# Patient Record
Sex: Male | Born: 1935 | Race: White | Hispanic: No | Smoking: Never smoker
Health system: Southern US, Community
[De-identification: ages and names within clinical notes are randomized; demographics above are authoritative.]

## PROBLEM LIST (undated history)

## (undated) DIAGNOSIS — M1990 Unspecified osteoarthritis, unspecified site: Secondary | ICD-10-CM

## (undated) DIAGNOSIS — E785 Hyperlipidemia, unspecified: Secondary | ICD-10-CM

## (undated) DIAGNOSIS — H95193 Other disorders following mastoidectomy, bilateral ears: Secondary | ICD-10-CM

## (undated) DIAGNOSIS — I10 Essential (primary) hypertension: Secondary | ICD-10-CM

## (undated) DIAGNOSIS — R1310 Dysphagia, unspecified: Secondary | ICD-10-CM

## (undated) DIAGNOSIS — D649 Anemia, unspecified: Secondary | ICD-10-CM

## (undated) DIAGNOSIS — M109 Gout, unspecified: Secondary | ICD-10-CM

## (undated) DIAGNOSIS — S62109A Fracture of unspecified carpal bone, unspecified wrist, initial encounter for closed fracture: Secondary | ICD-10-CM

## (undated) HISTORY — PX: OTHER SURGICAL HISTORY: SHX169

## (undated) HISTORY — DX: Anemia, unspecified: D64.9

## (undated) HISTORY — PX: MASTOIDECTOMY REVISION: SUR856

## (undated) HISTORY — DX: Essential (primary) hypertension: I10

## (undated) HISTORY — DX: Hyperlipidemia, unspecified: E78.5

## (undated) HISTORY — DX: Unspecified osteoarthritis, unspecified site: M19.90

---

## 1998-08-07 ENCOUNTER — Other Ambulatory Visit: Admission: RE | Admit: 1998-08-07 | Discharge: 1998-08-07 | Payer: Self-pay | Admitting: Urology

## 2000-09-30 ENCOUNTER — Other Ambulatory Visit: Admission: RE | Admit: 2000-09-30 | Discharge: 2000-09-30 | Payer: Self-pay | Admitting: Urology

## 2000-09-30 ENCOUNTER — Encounter (INDEPENDENT_AMBULATORY_CARE_PROVIDER_SITE_OTHER): Payer: Self-pay | Admitting: Specialist

## 2004-01-07 ENCOUNTER — Ambulatory Visit: Payer: Self-pay | Admitting: Internal Medicine

## 2005-10-05 ENCOUNTER — Encounter: Admission: RE | Admit: 2005-10-05 | Discharge: 2005-10-05 | Payer: Self-pay | Admitting: Orthopedic Surgery

## 2007-03-02 DIAGNOSIS — C439 Malignant melanoma of skin, unspecified: Secondary | ICD-10-CM

## 2007-03-02 HISTORY — DX: Malignant melanoma of skin, unspecified: C43.9

## 2007-03-06 ENCOUNTER — Ambulatory Visit: Payer: Self-pay | Admitting: Internal Medicine

## 2007-03-06 LAB — CONVERTED CEMR LAB
Folate: >20 ng/mL
Vitamin B-12: 951 pg/mL — ABNORMAL HIGH (ref 211–911)

## 2007-07-25 ENCOUNTER — Ambulatory Visit: Payer: Self-pay | Admitting: Hematology and Oncology

## 2007-07-26 ENCOUNTER — Encounter: Payer: Self-pay | Admitting: Internal Medicine

## 2007-08-01 ENCOUNTER — Ambulatory Visit (HOSPITAL_COMMUNITY): Admission: RE | Admit: 2007-08-01 | Discharge: 2007-08-01 | Payer: Self-pay | Admitting: Hematology and Oncology

## 2007-08-02 LAB — UIFE/LIGHT CHAINS/TP QN, 24-HR UR
Free Kappa Lt Chains,Ur: 9.08 mg/dL — ABNORMAL HIGH (ref 0.04–1.51)
Free Kappa/Lambda Ratio: 20.18 ratio — ABNORMAL HIGH (ref 0.46–4.00)
Free Lambda Lt Chains,Ur: 0.45 mg/dL (ref 0.08–1.01)
Free Lt Chn Excr Rate: 72.64 mg/d
Time: 24 hours
Total Protein, Urine-Ur/day: 94 mg/d (ref 10–140)
Total Protein, Urine: 11.7 mg/dL
Volume, Urine: 800 mL

## 2007-08-09 ENCOUNTER — Encounter: Payer: Self-pay | Admitting: Internal Medicine

## 2008-02-01 ENCOUNTER — Ambulatory Visit: Payer: Self-pay | Admitting: Hematology and Oncology

## 2008-02-05 LAB — CBC WITH DIFFERENTIAL/PLATELET
Basophils Absolute: 0 10*3/uL (ref 0.0–0.1)
EOS%: 4.9 % (ref 0.0–7.0)
HGB: 12.5 g/dL — ABNORMAL LOW (ref 13.0–17.1)
LYMPH%: 21.3 % (ref 14.0–48.0)
MCH: 32.4 pg (ref 28.0–33.4)
MCV: 93.7 fL (ref 81.6–98.0)
MONO%: 7.2 % (ref 0.0–13.0)
Platelets: 214 10*3/uL (ref 145–400)
RBC: 3.85 10*6/uL — ABNORMAL LOW (ref 4.20–5.71)
RDW: 13.7 % (ref 11.2–14.6)

## 2008-02-07 ENCOUNTER — Encounter: Payer: Self-pay | Admitting: Internal Medicine

## 2008-02-07 LAB — COMPREHENSIVE METABOLIC PANEL
AST: 23 U/L (ref 0–37)
Albumin: 4.2 g/dL (ref 3.5–5.2)
Alkaline Phosphatase: 52 U/L (ref 39–117)
BUN: 20 mg/dL (ref 6–23)
Creatinine, Ser: 1.44 mg/dL (ref 0.40–1.50)
Potassium: 3.8 mEq/L (ref 3.5–5.3)
Total Bilirubin: 0.6 mg/dL (ref 0.3–1.2)

## 2008-02-07 LAB — SPEP & IFE WITH QIG
Albumin ELP: 58.9 % (ref 55.8–66.1)
Alpha-1-Globulin: 4.9 % (ref 2.9–4.9)
IgA: 153 mg/dL (ref 68–378)
IgM, Serum: 70 mg/dL (ref 60–263)
Total Protein, Serum Electrophoresis: 6.7 g/dL (ref 6.0–8.3)

## 2008-08-01 ENCOUNTER — Ambulatory Visit: Payer: Self-pay | Admitting: Hematology and Oncology

## 2008-08-05 ENCOUNTER — Ambulatory Visit (HOSPITAL_COMMUNITY): Admission: RE | Admit: 2008-08-05 | Discharge: 2008-08-05 | Payer: Self-pay | Admitting: Hematology and Oncology

## 2008-08-05 LAB — CBC WITH DIFFERENTIAL/PLATELET
Eosinophils Absolute: 0 10*3/uL (ref 0.0–0.5)
MONO#: 0.3 10*3/uL (ref 0.1–0.9)
NEUT#: 4.3 10*3/uL (ref 1.5–6.5)
Platelets: 203 10*3/uL (ref 140–400)
RBC: 3.57 10*6/uL — ABNORMAL LOW (ref 4.20–5.82)
RDW: 13.5 % (ref 11.0–14.6)
WBC: 5.4 10*3/uL (ref 4.0–10.3)
lymph#: 0.7 10*3/uL — ABNORMAL LOW (ref 0.9–3.3)

## 2008-08-07 ENCOUNTER — Encounter: Payer: Self-pay | Admitting: Internal Medicine

## 2008-08-07 LAB — COMPREHENSIVE METABOLIC PANEL
Albumin: 4.1 g/dL (ref 3.5–5.2)
CO2: 29 mEq/L (ref 19–32)
Chloride: 104 mEq/L (ref 96–112)
Glucose, Bld: 123 mg/dL — ABNORMAL HIGH (ref 70–99)
Potassium: 3.8 mEq/L (ref 3.5–5.3)
Sodium: 141 mEq/L (ref 135–145)
Total Protein: 6.8 g/dL (ref 6.0–8.3)

## 2008-08-07 LAB — SPEP & IFE WITH QIG
Beta 2: 3.4 % (ref 3.2–6.5)
Beta Globulin: 6 % (ref 4.7–7.2)
IgA: 128 mg/dL (ref 68–378)
IgG (Immunoglobin G), Serum: 1190 mg/dL (ref 694–1618)
M-Spike, %: 0.64 g/dL

## 2008-08-21 ENCOUNTER — Ambulatory Visit (HOSPITAL_COMMUNITY): Admission: RE | Admit: 2008-08-21 | Discharge: 2008-08-21 | Payer: Self-pay | Admitting: Internal Medicine

## 2008-08-27 ENCOUNTER — Encounter: Admission: RE | Admit: 2008-08-27 | Discharge: 2008-08-27 | Payer: Self-pay | Admitting: Internal Medicine

## 2008-09-13 ENCOUNTER — Encounter: Admission: RE | Admit: 2008-09-13 | Discharge: 2008-09-13 | Payer: Self-pay | Admitting: Internal Medicine

## 2008-10-30 ENCOUNTER — Encounter: Admission: RE | Admit: 2008-10-30 | Discharge: 2008-11-28 | Payer: Self-pay | Admitting: Internal Medicine

## 2009-05-01 ENCOUNTER — Ambulatory Visit: Payer: Self-pay | Admitting: Hematology and Oncology

## 2009-05-05 LAB — CBC WITH DIFFERENTIAL/PLATELET
BASO%: 0.3 % (ref 0.0–2.0)
Basophils Absolute: 0 10*3/uL (ref 0.0–0.1)
EOS%: 1.9 % (ref 0.0–7.0)
HCT: 32.6 % — ABNORMAL LOW (ref 38.4–49.9)
HGB: 11.4 g/dL — ABNORMAL LOW (ref 13.0–17.1)
MCH: 33.6 pg — ABNORMAL HIGH (ref 27.2–33.4)
MONO#: 0.3 10*3/uL (ref 0.1–0.9)
RDW: 13.2 % (ref 11.0–14.6)
WBC: 4.1 10*3/uL (ref 4.0–10.3)
lymph#: 0.9 10*3/uL (ref 0.9–3.3)

## 2009-05-07 LAB — COMPREHENSIVE METABOLIC PANEL
ALT: 17 U/L (ref 0–53)
AST: 24 U/L (ref 0–37)
Albumin: 4 g/dL (ref 3.5–5.2)
BUN: 21 mg/dL (ref 6–23)
CO2: 28 mEq/L (ref 19–32)
Calcium: 9.5 mg/dL (ref 8.4–10.5)
Chloride: 102 mEq/L (ref 96–112)
Potassium: 4 mEq/L (ref 3.5–5.3)

## 2009-05-07 LAB — SPEP & IFE WITH QIG
Alpha-1-Globulin: 4.3 % (ref 2.9–4.9)
Alpha-2-Globulin: 7.9 % (ref 7.1–11.8)
Beta 2: 2.9 % — ABNORMAL LOW (ref 3.2–6.5)
Gamma Globulin: 17.7 % (ref 11.1–18.8)
IgG (Immunoglobin G), Serum: 1190 mg/dL (ref 694–1618)

## 2009-05-08 ENCOUNTER — Encounter: Payer: Self-pay | Admitting: Internal Medicine

## 2010-01-15 ENCOUNTER — Ambulatory Visit: Payer: Self-pay | Admitting: Internal Medicine

## 2010-01-15 DIAGNOSIS — K5900 Constipation, unspecified: Secondary | ICD-10-CM | POA: Insufficient documentation

## 2010-01-15 DIAGNOSIS — Z860100 Personal history of colon polyps, unspecified: Secondary | ICD-10-CM | POA: Insufficient documentation

## 2010-01-15 DIAGNOSIS — D649 Anemia, unspecified: Secondary | ICD-10-CM | POA: Insufficient documentation

## 2010-02-03 ENCOUNTER — Ambulatory Visit: Payer: Self-pay | Admitting: Hematology and Oncology

## 2010-02-04 ENCOUNTER — Ambulatory Visit (HOSPITAL_COMMUNITY)
Admission: RE | Admit: 2010-02-04 | Discharge: 2010-02-04 | Payer: Self-pay | Source: Home / Self Care | Admitting: Hematology and Oncology

## 2010-02-04 LAB — CBC WITH DIFFERENTIAL/PLATELET
BASO%: 0.4 % (ref 0.0–2.0)
EOS%: 2.2 % (ref 0.0–7.0)
HCT: 33.4 % — ABNORMAL LOW (ref 38.4–49.9)
LYMPH%: 22.8 % (ref 14.0–49.0)
MCH: 34.1 pg — ABNORMAL HIGH (ref 27.2–33.4)
MCHC: 35 g/dL (ref 32.0–36.0)
MCV: 97.5 fL (ref 79.3–98.0)
MONO%: 8.1 % (ref 0.0–14.0)
NEUT%: 66.5 % (ref 39.0–75.0)
Platelets: 206 10*3/uL (ref 140–400)

## 2010-02-06 LAB — PROTEIN ELECTROPHORESIS, SERUM
Alpha-1-Globulin: 4.8 % (ref 2.9–4.9)
Alpha-2-Globulin: 8.9 % (ref 7.1–11.8)
Gamma Globulin: 17.1 % (ref 11.1–18.8)

## 2010-02-06 LAB — LACTATE DEHYDROGENASE: LDH: 122 U/L (ref 94–250)

## 2010-02-06 LAB — IGG, IGA, IGM
IgA: 104 mg/dL (ref 68–378)
IgG (Immunoglobin G), Serum: 1150 mg/dL (ref 694–1618)

## 2010-02-06 LAB — COMPREHENSIVE METABOLIC PANEL
ALT: 16 U/L (ref 0–53)
CO2: 31 mEq/L (ref 19–32)
Creatinine, Ser: 1.37 mg/dL (ref 0.40–1.50)
Total Bilirubin: 0.4 mg/dL (ref 0.3–1.2)

## 2010-02-11 ENCOUNTER — Encounter: Payer: Self-pay | Admitting: Internal Medicine

## 2010-04-02 NOTE — Letter (Signed)
Summary: [facility]  [facility]   Imported By: Sherian Rein 02/24/2010 15:53:28  _____________________________________________________________________  External Attachment:    Type:   Image     Comment:   External Document

## 2010-04-02 NOTE — Letter (Signed)
[facility]   Imported By: Lester Burleson 05/27/2009 07:39:24  _____________________________________________________________________  External Attachment:    Type:   Image     Comment:   External Document

## 2010-04-02 NOTE — Assessment & Plan Note (Signed)
Summary: Anemia and constipation   History of Present Illness Visit Type: Initial Consult Primary GI MD: Yancey Flemings MD Primary Provider: Ezequiel Kayser, MD Requesting Provider: Ezequiel Kayser, MD Chief Complaint: Anemia. Pt states that he was having trouble with constipation but started Miralax and he is ok and denies any GI complaints  History of Present Illness:   77 year old white male with a history of hypertension, asthma, gout, and monoclonal gammopathy for which he is followed by hematology. He presents today at the recommendation of Dr. Waynard Edwards. He saw Dr. Waynard Edwards in December 2010. At that time he was noted to be mildly anemic with a hemoglobin of 11.6. Macrocytic with MCV of 99. Normal B12, folate, and iron studies. At that time he also complained of constipation. MiraLax recommended. MiraLax controls his bowels quite well. He has postponed his appointment here for unclear reasons, the esophagus this time to followup as previously recommended. Nothing to precipitate this visit such as recurrent constipation, bleeding, or abdominal pain. He does states that he "felt guilty". Hemoglobin from the hematology office in March 2011 was 11.4. Complete colonoscopy performed in November 2005 revealed mild sigmoid diverticulosis and a diminutive colon polyp which was removed. Patient has no active GI complaints at this time.   GI Review of Systems      Denies abdominal pain, acid reflux, belching, bloating, chest pain, dysphagia with liquids, dysphagia with solids, heartburn, loss of appetite, nausea, vomiting, vomiting blood, weight loss, and  weight gain.        Denies anal fissure, black tarry stools, change in bowel habit, constipation, diarrhea, diverticulosis, fecal incontinence, heme positive stool, hemorrhoids, irritable bowel syndrome, jaundice, light color stool, liver problems, rectal bleeding, and  rectal pain.    Current Medications (verified): 1)  The Very Finest Fish Oil  Liqd (Omega-3  Fatty Acids) .... 2 Teaspoons Once Daily 2)  Vitamin B-12 1000 Mcg Tabs (Cyanocobalamin) .... One Tablet By Mouth Once Daily 3)  Vitamin D 2000 Unit Tabs (Cholecalciferol) .... One Tablet By Mouth Once Daily 4)  Calcium 1000 + D 1000-800 Mg-Unit Tabs (Calcium Carb-Cholecalciferol) .... One Tablet By Mouth Once Daily 5)  Vision Vitamins  Tabs (Multiple Vitamins-Minerals) .... One Tablet By Mouth Once Daily 6)  Benefiber  Powd (Wheat Dextrin) .... Two Teaspoons Once Daily 7)  Miralax  Powd (Polyethylene Glycol 3350) .... Once Daily 8)  Crestor 5 Mg Tabs (Rosuvastatin Calcium) .... One Tablet By Mouth Once Daily 9)  Finasteride 5 Mg Tabs (Finasteride) .... One Tablet By Mouth Once Daily 10)  Chlorthalidone 25 Mg Tabs (Chlorthalidone) .... 1/2  Tablet By Mouth Once Daily  Allergies (verified): 1)  ! Aspirin  Past History:  Past Medical History: 211.3: Colon Polyps 562.10: Diverticulosis, Colon Asthma Hypertension Gout Dyslipidemia  BPH Spinal Stenosis  Past Surgical History: Tonsillectomy  Family History: No FH of Colon Cancer:  Social History: Retired Widowed  Patient has never smoked.  Alcohol Use - no Illicit Drug Use - no Daily Caffeine Use: daily 2 cups of coffee  Smoking Status:  never Drug Use:  no  Review of Systems       The patient complains of anemia and hearing problems.  The patient denies allergy/sinus, anxiety-new, arthritis/joint pain, back pain, blood in urine, breast changes/lumps, change in vision, confusion, cough, coughing up blood, depression-new, fainting, fatigue, fever, headaches-new, heart murmur, heart rhythm changes, itching, menstrual pain, muscle pains/cramps, night sweats, nosebleeds, pregnancy symptoms, shortness of breath, skin rash, sleeping problems, sore throat, swelling  of feet/legs, swollen lymph glands, thirst - excessive , urination - excessive , urination changes/pain, urine leakage, vision changes, and voice change.    Vital  Signs:  Patient profile:   77 year old male Height:      67 inches Weight:      141 pounds BMI:     22.16 BSA:     1.74 Pulse rate:   64 / minute Pulse rhythm:   regular BP sitting:   128 / 64  (left arm) Cuff size:   regular  Vitals Entered By: Ok Anis CMA (January 15, 2010 10:42 AM)  Physical Exam  General:  Well developed, well nourished, no acute distress. Head:  Normocephalic and atraumatic. Eyes:  anicteric Ears:  bilateral hearing aids Mouth:  No deformity or lesions. Lungs:  Clear throughout to auscultation. Heart:  Regular rate and rhythm; no murmurs, rubs,  or bruits. Abdomen:  Soft, nontender and nondistended. No masses, hepatosplenomegaly or hernias noted. Normal bowel sounds. Msk:  Symmetrical with no gross deformities. Normal posture. Pulses:  Normal pulses noted. Extremities:  no edema Neurologic:  Alert and  oriented x4. Skin:  no jaundice with normal skin pallor Psych:  Alert and cooperative. Normal mood and affect.   Impression & Recommendations:  Problem # 1:  ANEMIA-UNSPECIFIED (ICD-285.9) stable mild macrocytic anemia with no evidence of bleeding or iron deficiency. Colonoscopy in 2005 as described. Suspect mild anemia related to his monoclonal gammopathy and age. No GI workup planned. Discussed with patient.  Problem # 2:  CONSTIPATION (ICD-564.00) functional constipation. Excellent response to the regular MiraLax therapy. Recommended to continue MiraLax to maintain regular bowel habits  Problem # 3:  PERSONAL HX COLONIC POLYPS (ICD-V12.72) diminutive polyp, without pathology, 2005. No routine followup due to age  Patient Instructions: 1)  Please schedule a follow-up appointment as needed.  2)  Copy sent to : Ezequiel Kayser, MD 3)  The medication list was reviewed and reconciled.  All changed / newly prescribed medications were explained.  A complete medication list was provided to the patient / caregiver.

## 2010-04-02 NOTE — Procedures (Signed)
Summary: Colonoscopy   Colonoscopy  Procedure date:  01/07/2004  Findings:      Results: Polyp.  removed but not retrieved Location:  [facility].   Patient Name: Shirley, Bolle MRN:  Procedure Procedures: Colonoscopy CPT: [phone].    with polypectomy. CPT: [id number].  Personnel: Endoscopist: Wilhemina Bonito. Marina Goodell, MD.  Referred By: [name], MD.  Exam Location: Exam performed in Outpatient Clinic. Outpatient  Patient Consent: Procedure, Alternatives, Risks and Benefits discussed, consent obtained, from patient. Consent was obtained by the RN.  Indications  Evaluation of: Positive fecal occult blood test per home screening.  History  Current Medications: Patient is not currently taking Coumadin.  Pre-Exam Physical: Performed Jan 07, 2004. Entire physical exam was normal. Abnormal PE findings include: hearing aid.  Exam Exam: Extent of exam reached: Cecum, extent intended: Cecum.  The cecum was identified by appendiceal orifice and IC valve. Patient position: on left side. Colon retroflexion performed. Images taken. ASA Classification: II. Tolerance: excellent.  Monitoring: Pulse and BP monitoring, Oximetry used. Supplemental O2 given.  Colon Prep Used Miralax for colon prep. Prep results: good.  Sedation Meds: Patient assessed and found to be appropriate for moderate (conscious) sedation. Fentanyl 50 mcg. given IV. Versed 5 mg. given IV.  Findings POLYP: Transverse Colon, diminutive, Procedure:  snare without cautery, removed, not retrieved, ICD9: Colon Polyps: 211.3.  NORMAL EXAM: Cecum to Rectum.  - DIVERTICULOSIS: Sigmoid Colon. ICD9: Diverticulosis, Colon: 562.10. Comments: mild.   Assessment Abnormal examination, see findings above.  Diagnoses: 211.3: Colon Polyps.  562.10: Diverticulosis, Colon.   Events  Unplanned Interventions: No intervention was required.  Unplanned Events: There were no complications. Plans Disposition: After  procedure patient sent to recovery. After recovery patient sent home.  Scheduling/Referral: Follow-Up prn.  Comments: Return to the care of Dr. Waynard Edwards  This report was created from the original endoscopy report, which was reviewed and signed by the above listed endoscopist.   cc: [name], MD     The Patient

## 2010-07-14 NOTE — Assessment & Plan Note (Signed)
[facility]                         GASTROENTEROLOGY OFFICE NOTE   NAME:Dipaola, DASHIELL FRANCHINO                  MRN:          [id number]  DATE:03/06/2007                            DOB:          1925/02/25    REFERRING PHYSICIAN:  Dr. [name].   REASON FOR CONSULTATION:  Weight loss, mild anemia, constipation.   HISTORY:  This is a pleasant 77 year old white male with a history of  dyslipidemia, benign prostatic hypertrophy, and hypertension.  He is  accompanied today by his daughter after being referred for the above  listed issues.  Patient and his daughter report that the past 6 months  have been quite stressful.  His wife became ill, and subsequently passed  away in mid November.  During that time, he was not eating, and had  associated weight loss.  Also, noticed that his bowels were a bit more  constipated.  Hemoccult testing December 26, 2006 was negative.  His  hemoglobin was borderline at 11.7.  Iron studies were normal.  Of  importance, patient did undergo complete colonoscopy in November 2005 to  evaluate Hemoccult positive stool.  At that time, the patient was found  to have mild sigmoid diverticulosis.  As well, 1 diminutive transverse  colon polyp, which was removed.  Given his age, no specific followup  recommended.  Patient has been eating better over the past month, and  has actually gained a few pounds.  He has been using Benefiber for his  bowels, which works well.  There has been no abdominal pain or bleeding.  He does complain of gas and bloating, though this is not significant.  Overall, his daughter states he seems to be doing a bit better.   PAST MEDICAL HISTORY:  As above.   PAST SURGICAL HISTORY:  None.   ALLERGIES:  NO KNOWN DRUG ALLERGIES.   CURRENT MEDICATIONS:  1. Crestor 5 mg daily.  2. Finasteride 5 mg daily.  3. Chlorthalidone 12.5 mg daily.  4. Aspirin.  5. B-12.  6. Vitamin D.  7. Calcium.  8.  Benefiber.  9. Fish oil.   FAMILY HISTORY:  Negative for gastrointestinal malignancy.  Sister with  lung cancer.   SOCIAL HISTORY:  Patient is widowed with 2 daughters.  He is accompanied  by his daughter.  He lives alone.  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  Does not  smoke or use alcohol.   REVIEW OF SYSTEMS:  Per diagnostic evaluation form.   PHYSICAL EXAMINATION:  Well-appearing male in no acute distress.  Blood pressure 96/62.  Heart rate is 68.  Weight is 133 pounds.  He is 5  feet 8 inches in height.  HEENT:  Sclerae anicteric.  Conjunctivae are pink.  Oral mucosa is  intact.  No adenopathy.  LUNGS:  Clear.  HEART:  Regular.  ABDOMEN:  Soft without tenderness, mass, or hernia.   IMPRESSION:  An 77 year old gentleman who is referred regarding weight  loss, constipation, and mild anemia.  It seems that his weight loss was  a direct result of decreased appetite due to the  stress surrounding his  wife's illness.  He lives alone.  He has been able to gain weight  recently with improved caloric intake.  In terms of his bowel habits, I  suspect the constipation was a function of decreased oral intake.  In  any event, he is better with Benefiber.  Colonoscopy just 3 years ago  essentially negative.  Finally, he has a very mild normocytic anemia  with normal iron studies.  We did do B-12 and folate studies today,  which were normal.  I do not think further workup in this 77 year old is  indicated at present.  As such, I have provided reassurance, and asked  him to follow up with Dr. Waynard Edwards as they would normally.     Wilhemina Bonito. Marina Goodell, MD  Electronically Signed    JNP/MedQ  DD: 03/08/2007  DT: 03/08/2007  Job #: [id number]   cc:   Loraine Leriche A. Perini, M.D.

## 2010-11-03 ENCOUNTER — Encounter: Payer: Medicare Other | Admitting: Hematology and Oncology

## 2010-11-03 ENCOUNTER — Other Ambulatory Visit: Payer: Self-pay | Admitting: Hematology and Oncology

## 2010-11-03 DIAGNOSIS — D472 Monoclonal gammopathy: Secondary | ICD-10-CM

## 2010-11-03 LAB — CBC WITH DIFFERENTIAL/PLATELET
BASO%: 0.4 % (ref 0.0–2.0)
Basophils Absolute: 0 10*3/uL (ref 0.0–0.1)
EOS%: 1.3 % (ref 0.0–7.0)
HGB: 11.6 g/dL — ABNORMAL LOW (ref 13.0–17.1)
MCH: 34.1 pg — ABNORMAL HIGH (ref 27.2–33.4)
MCHC: 35.3 g/dL (ref 32.0–36.0)
MCV: 96.5 fL (ref 79.3–98.0)
MONO%: 5.5 % (ref 0.0–14.0)
NEUT%: 74.6 % (ref 39.0–75.0)
RDW: 13.5 % (ref 11.0–14.6)
lymph#: 0.9 10*3/uL (ref 0.9–3.3)

## 2010-11-05 LAB — SPEP & IFE WITH QIG
Albumin ELP: 60.6 % (ref 55.8–66.1)
Alpha-1-Globulin: 4.4 % (ref 2.9–4.9)
Alpha-2-Globulin: 8.3 % (ref 7.1–11.8)
IgM, Serum: 50 mg/dL (ref 41–251)

## 2010-11-05 LAB — COMPREHENSIVE METABOLIC PANEL
ALT: 19 U/L (ref 0–53)
AST: 25 U/L (ref 0–37)
Alkaline Phosphatase: 42 U/L (ref 39–117)
BUN: 21 mg/dL (ref 6–23)
Chloride: 105 mEq/L (ref 96–112)
Creatinine, Ser: 1.38 mg/dL — ABNORMAL HIGH (ref 0.50–1.35)
Potassium: 3.9 mEq/L (ref 3.5–5.3)

## 2010-11-10 ENCOUNTER — Encounter (HOSPITAL_BASED_OUTPATIENT_CLINIC_OR_DEPARTMENT_OTHER): Payer: Medicare Other | Admitting: Hematology and Oncology

## 2010-11-10 DIAGNOSIS — D472 Monoclonal gammopathy: Secondary | ICD-10-CM

## 2011-03-15 DIAGNOSIS — H2510 Age-related nuclear cataract, unspecified eye: Secondary | ICD-10-CM | POA: Diagnosis not present

## 2011-03-22 DIAGNOSIS — H2510 Age-related nuclear cataract, unspecified eye: Secondary | ICD-10-CM | POA: Diagnosis not present

## 2011-03-22 DIAGNOSIS — H269 Unspecified cataract: Secondary | ICD-10-CM | POA: Diagnosis not present

## 2011-04-12 DIAGNOSIS — H60399 Other infective otitis externa, unspecified ear: Secondary | ICD-10-CM | POA: Diagnosis not present

## 2011-05-04 DIAGNOSIS — M899 Disorder of bone, unspecified: Secondary | ICD-10-CM | POA: Diagnosis not present

## 2011-05-04 DIAGNOSIS — I10 Essential (primary) hypertension: Secondary | ICD-10-CM | POA: Diagnosis not present

## 2011-05-04 DIAGNOSIS — M949 Disorder of cartilage, unspecified: Secondary | ICD-10-CM | POA: Diagnosis not present

## 2011-05-04 DIAGNOSIS — Z125 Encounter for screening for malignant neoplasm of prostate: Secondary | ICD-10-CM | POA: Diagnosis not present

## 2011-05-04 DIAGNOSIS — E785 Hyperlipidemia, unspecified: Secondary | ICD-10-CM | POA: Diagnosis not present

## 2011-05-04 DIAGNOSIS — R7301 Impaired fasting glucose: Secondary | ICD-10-CM | POA: Diagnosis not present

## 2011-05-05 DIAGNOSIS — Z1212 Encounter for screening for malignant neoplasm of rectum: Secondary | ICD-10-CM | POA: Diagnosis not present

## 2011-05-11 DIAGNOSIS — I10 Essential (primary) hypertension: Secondary | ICD-10-CM | POA: Diagnosis not present

## 2011-05-11 DIAGNOSIS — Z0000 Encounter for general adult medical examination without abnormal findings: Secondary | ICD-10-CM | POA: Diagnosis not present

## 2011-05-11 DIAGNOSIS — R7301 Impaired fasting glucose: Secondary | ICD-10-CM | POA: Diagnosis not present

## 2011-05-11 DIAGNOSIS — E785 Hyperlipidemia, unspecified: Secondary | ICD-10-CM | POA: Diagnosis not present

## 2011-05-11 DIAGNOSIS — Z125 Encounter for screening for malignant neoplasm of prostate: Secondary | ICD-10-CM | POA: Diagnosis not present

## 2011-05-13 DIAGNOSIS — H35379 Puckering of macula, unspecified eye: Secondary | ICD-10-CM | POA: Diagnosis not present

## 2011-05-13 DIAGNOSIS — H35359 Cystoid macular degeneration, unspecified eye: Secondary | ICD-10-CM | POA: Diagnosis not present

## 2011-05-19 DIAGNOSIS — R351 Nocturia: Secondary | ICD-10-CM | POA: Diagnosis not present

## 2011-05-19 DIAGNOSIS — N401 Enlarged prostate with lower urinary tract symptoms: Secondary | ICD-10-CM | POA: Diagnosis not present

## 2011-05-20 ENCOUNTER — Telehealth: Payer: Self-pay | Admitting: Hematology and Oncology

## 2011-05-20 DIAGNOSIS — Z961 Presence of intraocular lens: Secondary | ICD-10-CM | POA: Diagnosis not present

## 2011-05-20 NOTE — Telephone Encounter (Signed)
Returned pt's call re June 2013. Lm for pt re appts for 6/4 and 3/11. June schedule mailed today.

## 2011-05-26 ENCOUNTER — Telehealth: Payer: Self-pay | Admitting: Hematology and Oncology

## 2011-05-26 NOTE — Telephone Encounter (Signed)
Returned pt's call and r/s 6/4 appt to 6/5 @ 1 pm per pt's request.

## 2011-06-10 DIAGNOSIS — C44621 Squamous cell carcinoma of skin of unspecified upper limb, including shoulder: Secondary | ICD-10-CM | POA: Diagnosis not present

## 2011-06-10 DIAGNOSIS — D0460 Carcinoma in situ of skin of unspecified upper limb, including shoulder: Secondary | ICD-10-CM | POA: Diagnosis not present

## 2011-06-10 DIAGNOSIS — L570 Actinic keratosis: Secondary | ICD-10-CM | POA: Diagnosis not present

## 2011-07-12 DIAGNOSIS — H6120 Impacted cerumen, unspecified ear: Secondary | ICD-10-CM | POA: Diagnosis not present

## 2011-07-12 DIAGNOSIS — H60399 Other infective otitis externa, unspecified ear: Secondary | ICD-10-CM | POA: Diagnosis not present

## 2011-07-30 DIAGNOSIS — L819 Disorder of pigmentation, unspecified: Secondary | ICD-10-CM | POA: Diagnosis not present

## 2011-07-30 DIAGNOSIS — Z85828 Personal history of other malignant neoplasm of skin: Secondary | ICD-10-CM | POA: Diagnosis not present

## 2011-08-03 ENCOUNTER — Other Ambulatory Visit: Payer: Medicare Other

## 2011-08-03 DIAGNOSIS — H2510 Age-related nuclear cataract, unspecified eye: Secondary | ICD-10-CM | POA: Diagnosis not present

## 2011-08-03 DIAGNOSIS — Z961 Presence of intraocular lens: Secondary | ICD-10-CM | POA: Diagnosis not present

## 2011-08-03 DIAGNOSIS — H35379 Puckering of macula, unspecified eye: Secondary | ICD-10-CM | POA: Diagnosis not present

## 2011-08-04 ENCOUNTER — Other Ambulatory Visit (HOSPITAL_BASED_OUTPATIENT_CLINIC_OR_DEPARTMENT_OTHER): Payer: Medicare Other

## 2011-08-04 DIAGNOSIS — C9000 Multiple myeloma not having achieved remission: Secondary | ICD-10-CM

## 2011-08-04 LAB — CBC WITH DIFFERENTIAL/PLATELET
BASO%: 0.3 % (ref 0.0–2.0)
Basophils Absolute: 0 10*3/uL (ref 0.0–0.1)
Eosinophils Absolute: 0.2 10*3/uL (ref 0.0–0.5)
HCT: 34.9 % — ABNORMAL LOW (ref 38.4–49.9)
HGB: 12.3 g/dL — ABNORMAL LOW (ref 13.0–17.1)
MCHC: 35.3 g/dL (ref 32.0–36.0)
MONO#: 0.3 10*3/uL (ref 0.1–0.9)
NEUT#: 3.3 10*3/uL (ref 1.5–6.5)
NEUT%: 69.4 % (ref 39.0–75.0)
Platelets: 196 10*3/uL (ref 140–400)
WBC: 4.7 10*3/uL (ref 4.0–10.3)
lymph#: 1 10*3/uL (ref 0.9–3.3)

## 2011-08-06 LAB — COMPREHENSIVE METABOLIC PANEL
AST: 29 U/L (ref 0–37)
Albumin: 4.1 g/dL (ref 3.5–5.2)
BUN: 21 mg/dL (ref 6–23)
CO2: 31 mEq/L (ref 19–32)
Calcium: 10.1 mg/dL (ref 8.4–10.5)
Chloride: 104 mEq/L (ref 96–112)
Potassium: 3.6 mEq/L (ref 3.5–5.3)

## 2011-08-06 LAB — PROTEIN ELECTROPHORESIS, SERUM, WITH REFLEX
Alpha-1-Globulin: 4.6 % (ref 2.9–4.9)
Gamma Globulin: 17.5 % (ref 11.1–18.8)
M-Spike, %: 0.69 g/dL
Total Protein, Serum Electrophoresis: 6.6 g/dL (ref 6.0–8.3)

## 2011-08-06 LAB — IGG, IGA, IGM
IgA: 118 mg/dL (ref 68–379)
IgG (Immunoglobin G), Serum: 1170 mg/dL (ref 650–1600)

## 2011-08-06 LAB — IFE INTERPRETATION

## 2011-08-10 ENCOUNTER — Ambulatory Visit (HOSPITAL_BASED_OUTPATIENT_CLINIC_OR_DEPARTMENT_OTHER): Payer: Medicare Other | Admitting: Hematology and Oncology

## 2011-08-10 ENCOUNTER — Encounter: Payer: Self-pay | Admitting: Hematology and Oncology

## 2011-08-10 ENCOUNTER — Telehealth: Payer: Self-pay | Admitting: Hematology and Oncology

## 2011-08-10 VITALS — BP 151/76 | HR 70 | Temp 96.6°F | Ht 67.0 in | Wt 144.2 lb

## 2011-08-10 DIAGNOSIS — D472 Monoclonal gammopathy: Secondary | ICD-10-CM | POA: Diagnosis not present

## 2011-08-10 DIAGNOSIS — H60399 Other infective otitis externa, unspecified ear: Secondary | ICD-10-CM | POA: Diagnosis not present

## 2011-08-10 NOTE — Progress Notes (Signed)
CC:   [name], M.D. Wilhemina Bonito. Marina Goodell, MD Norval Gable. [place], M.D.  IDENTIFYING STATEMENT:  Patient is an 78 year old man with MGUS who presents for followup.  INTERVAL HISTORY:  The patient was seen 9 months ago.  He has had no issues or concerns since his last followup visit.  He denies bone pain. His weight is stable.  He is moving his bowels with very minimal problems.  MEDICATIONS:  Reviewed and updated.  PHYSICAL EXAM:  Patient is alert and oriented x3.  Vitals:  Pulse 70, blood pressure 151/76, temperature 96.6, respirations 18, weight 144.2 pounds.  HEENT:  Head is atraumatic, normocephalic.  Sclerae anicteric. Mouth moist.  Chest:  Clear.  CVS:  Unremarkable.  Abdomen:  Soft, nontender.  Bowel sounds present.  Extremities:  No calf tenderness or edema.  LABORATORY STUDIES:  08/04/2011 white cell count 4.7, hemoglobin 12.3, hematocrit 34.9, platelets 196.  Sodium 141, potassium 3.6, chloride 104, CO2 31, BUN 21, creatinine 1.18, glucose 99.  T bilirubin 0.5, alkaline phosphatase 46, AST 29, ALT 24, calcium 10.1.  SPEP continues to show an IgG monoclonal protein measuring 0.69 g/dL.  IgG 1170.  IgA 118.  IgM 56.  IMPRESSION AND PLAN:  Mr. Bergland is an 78 year old man with an IgG monoclonal gammopathy of undetermined significance.  His protein studies remain stable.  With this said, he follows up in a year's time with blood work which also include skeletal survey.    ______________________________ Laurice Record, M.D. LIO/MEDQ  D:  08/10/2011  T:  08/10/2011  Job:  [id number]

## 2011-08-10 NOTE — Patient Instructions (Signed)
[name]  [id number]  [facility] Discharge Instructions  RECOMMENDATIONS MADE BY THE CONSULTANT AND ANY TEST RESULTS WILL BE SENT TO YOUR REFERRING DOCTOR.   EXAM FINDINGS BY MD TODAY AND SIGNS AND SYMPTOMS TO REPORT TO CLINIC OR PRIMARY MD:   Your current list of medications are: Current Outpatient Prescriptions  Medication Sig Dispense Refill  . chlorthalidone (HYGROTON) 25 MG tablet Take 12.5 mg by mouth daily.      Marland Kitchen FINASTERIDE PO Take 5 mg by mouth daily.      . rosuvastatin (CRESTOR) 5 MG tablet Take 2.5 mg by mouth daily.         INSTRUCTIONS GIVEN AND DISCUSSED:   SPECIAL INSTRUCTIONS/FOLLOW-UP:  See above.  I acknowledge that I have been informed and understand all the instructions given to me and received a copy. I do not have any more questions at this time, but understand that I may call the [facility] at [phone] during business hours should I have any further questions or need assistance in obtaining follow-up care.

## 2011-08-10 NOTE — Telephone Encounter (Signed)
appts made and printed for pt aom °

## 2011-08-10 NOTE — Progress Notes (Signed)
This office note has been dictated.

## 2011-08-11 DIAGNOSIS — M899 Disorder of bone, unspecified: Secondary | ICD-10-CM | POA: Diagnosis not present

## 2011-08-17 DIAGNOSIS — H60399 Other infective otitis externa, unspecified ear: Secondary | ICD-10-CM | POA: Diagnosis not present

## 2011-08-17 DIAGNOSIS — H6120 Impacted cerumen, unspecified ear: Secondary | ICD-10-CM | POA: Diagnosis not present

## 2011-08-24 DIAGNOSIS — D2370 Other benign neoplasm of skin of unspecified lower limb, including hip: Secondary | ICD-10-CM | POA: Diagnosis not present

## 2011-08-24 DIAGNOSIS — D485 Neoplasm of uncertain behavior of skin: Secondary | ICD-10-CM | POA: Diagnosis not present

## 2011-08-24 DIAGNOSIS — L821 Other seborrheic keratosis: Secondary | ICD-10-CM | POA: Diagnosis not present

## 2011-08-24 DIAGNOSIS — L578 Other skin changes due to chronic exposure to nonionizing radiation: Secondary | ICD-10-CM | POA: Diagnosis not present

## 2011-10-25 DIAGNOSIS — H6120 Impacted cerumen, unspecified ear: Secondary | ICD-10-CM | POA: Diagnosis not present

## 2011-11-18 DIAGNOSIS — H35379 Puckering of macula, unspecified eye: Secondary | ICD-10-CM | POA: Diagnosis not present

## 2011-11-30 DIAGNOSIS — Z23 Encounter for immunization: Secondary | ICD-10-CM | POA: Diagnosis not present

## 2012-01-24 DIAGNOSIS — H6120 Impacted cerumen, unspecified ear: Secondary | ICD-10-CM | POA: Diagnosis not present

## 2012-01-24 DIAGNOSIS — S8000XA Contusion of unspecified knee, initial encounter: Secondary | ICD-10-CM | POA: Diagnosis not present

## 2012-01-24 DIAGNOSIS — S1093XA Contusion of unspecified part of neck, initial encounter: Secondary | ICD-10-CM | POA: Diagnosis not present

## 2012-01-25 ENCOUNTER — Other Ambulatory Visit: Payer: Self-pay | Admitting: Family Medicine

## 2012-01-25 ENCOUNTER — Ambulatory Visit
Admission: RE | Admit: 2012-01-25 | Discharge: 2012-01-25 | Disposition: A | Discharge status: Home / Self Care | Payer: Medicare Other | Source: Ambulatory Visit | Attending: Family Medicine | Admitting: Family Medicine

## 2012-01-25 DIAGNOSIS — R609 Edema, unspecified: Secondary | ICD-10-CM

## 2012-01-25 DIAGNOSIS — S01109A Unspecified open wound of unspecified eyelid and periocular area, initial encounter: Secondary | ICD-10-CM | POA: Diagnosis not present

## 2012-01-25 DIAGNOSIS — S022XXA Fracture of nasal bones, initial encounter for closed fracture: Secondary | ICD-10-CM | POA: Diagnosis not present

## 2012-01-26 DIAGNOSIS — X58XXXA Exposure to other specified factors, initial encounter: Secondary | ICD-10-CM | POA: Diagnosis not present

## 2012-01-26 DIAGNOSIS — J342 Deviated nasal septum: Secondary | ICD-10-CM | POA: Diagnosis not present

## 2012-01-26 DIAGNOSIS — S022XXA Fracture of nasal bones, initial encounter for closed fracture: Secondary | ICD-10-CM | POA: Diagnosis not present

## 2012-01-26 DIAGNOSIS — S0180XA Unspecified open wound of other part of head, initial encounter: Secondary | ICD-10-CM | POA: Diagnosis not present

## 2012-02-03 DIAGNOSIS — S022XXA Fracture of nasal bones, initial encounter for closed fracture: Secondary | ICD-10-CM | POA: Diagnosis not present

## 2012-02-11 DIAGNOSIS — Z85828 Personal history of other malignant neoplasm of skin: Secondary | ICD-10-CM | POA: Diagnosis not present

## 2012-02-11 DIAGNOSIS — L578 Other skin changes due to chronic exposure to nonionizing radiation: Secondary | ICD-10-CM | POA: Diagnosis not present

## 2012-02-11 DIAGNOSIS — L819 Disorder of pigmentation, unspecified: Secondary | ICD-10-CM | POA: Diagnosis not present

## 2012-02-11 DIAGNOSIS — L821 Other seborrheic keratosis: Secondary | ICD-10-CM | POA: Diagnosis not present

## 2012-02-11 DIAGNOSIS — D1801 Hemangioma of skin and subcutaneous tissue: Secondary | ICD-10-CM | POA: Diagnosis not present

## 2012-04-04 DIAGNOSIS — H6120 Impacted cerumen, unspecified ear: Secondary | ICD-10-CM | POA: Diagnosis not present

## 2012-04-22 ENCOUNTER — Telehealth: Payer: Self-pay | Admitting: Oncology

## 2012-04-22 ENCOUNTER — Encounter: Payer: Self-pay | Admitting: Oncology

## 2012-04-22 NOTE — Telephone Encounter (Signed)
Talked to pt and gave him appt date with Dr. Arline Asp to June 2014 , a former Dr.Odogwu pt

## 2012-05-30 DIAGNOSIS — R351 Nocturia: Secondary | ICD-10-CM | POA: Diagnosis not present

## 2012-05-30 DIAGNOSIS — N401 Enlarged prostate with lower urinary tract symptoms: Secondary | ICD-10-CM | POA: Diagnosis not present

## 2012-06-30 DIAGNOSIS — H6120 Impacted cerumen, unspecified ear: Secondary | ICD-10-CM | POA: Diagnosis not present

## 2012-07-19 DIAGNOSIS — M899 Disorder of bone, unspecified: Secondary | ICD-10-CM | POA: Diagnosis not present

## 2012-07-19 DIAGNOSIS — R7301 Impaired fasting glucose: Secondary | ICD-10-CM | POA: Diagnosis not present

## 2012-07-19 DIAGNOSIS — E785 Hyperlipidemia, unspecified: Secondary | ICD-10-CM | POA: Diagnosis not present

## 2012-07-19 DIAGNOSIS — I10 Essential (primary) hypertension: Secondary | ICD-10-CM | POA: Diagnosis not present

## 2012-07-19 DIAGNOSIS — M949 Disorder of cartilage, unspecified: Secondary | ICD-10-CM | POA: Diagnosis not present

## 2012-07-26 DIAGNOSIS — Z125 Encounter for screening for malignant neoplasm of prostate: Secondary | ICD-10-CM | POA: Diagnosis not present

## 2012-07-26 DIAGNOSIS — M1710 Unilateral primary osteoarthritis, unspecified knee: Secondary | ICD-10-CM | POA: Diagnosis not present

## 2012-07-26 DIAGNOSIS — I10 Essential (primary) hypertension: Secondary | ICD-10-CM | POA: Diagnosis not present

## 2012-07-26 DIAGNOSIS — Z0000 Encounter for general adult medical examination without abnormal findings: Secondary | ICD-10-CM | POA: Diagnosis not present

## 2012-07-26 DIAGNOSIS — R7301 Impaired fasting glucose: Secondary | ICD-10-CM | POA: Diagnosis not present

## 2012-07-26 DIAGNOSIS — Z1331 Encounter for screening for depression: Secondary | ICD-10-CM | POA: Diagnosis not present

## 2012-07-26 DIAGNOSIS — E785 Hyperlipidemia, unspecified: Secondary | ICD-10-CM | POA: Diagnosis not present

## 2012-07-26 DIAGNOSIS — M949 Disorder of cartilage, unspecified: Secondary | ICD-10-CM | POA: Diagnosis not present

## 2012-07-26 DIAGNOSIS — D649 Anemia, unspecified: Secondary | ICD-10-CM | POA: Diagnosis not present

## 2012-07-26 DIAGNOSIS — M899 Disorder of bone, unspecified: Secondary | ICD-10-CM | POA: Diagnosis not present

## 2012-07-26 DIAGNOSIS — N182 Chronic kidney disease, stage 2 (mild): Secondary | ICD-10-CM | POA: Diagnosis not present

## 2012-07-26 DIAGNOSIS — Z23 Encounter for immunization: Secondary | ICD-10-CM | POA: Diagnosis not present

## 2012-08-02 ENCOUNTER — Other Ambulatory Visit (HOSPITAL_BASED_OUTPATIENT_CLINIC_OR_DEPARTMENT_OTHER): Payer: Medicare Other | Admitting: Lab

## 2012-08-02 ENCOUNTER — Ambulatory Visit (HOSPITAL_COMMUNITY)
Admission: RE | Admit: 2012-08-02 | Discharge: 2012-08-02 | Disposition: A | Discharge status: Home / Self Care | Payer: Medicare Other | Source: Ambulatory Visit | Attending: Hematology and Oncology | Admitting: Hematology and Oncology

## 2012-08-02 DIAGNOSIS — D472 Monoclonal gammopathy: Secondary | ICD-10-CM | POA: Insufficient documentation

## 2012-08-02 DIAGNOSIS — M542 Cervicalgia: Secondary | ICD-10-CM | POA: Diagnosis not present

## 2012-08-02 LAB — CBC WITH DIFFERENTIAL/PLATELET
Basophils Absolute: 0 10*3/uL (ref 0.0–0.1)
Eosinophils Absolute: 0.2 10*3/uL (ref 0.0–0.5)
HGB: 12.1 g/dL — ABNORMAL LOW (ref 13.0–17.1)
NEUT#: 2.8 10*3/uL (ref 1.5–6.5)
RBC: 3.51 10*6/uL — ABNORMAL LOW (ref 4.20–5.82)
RDW: 13.8 % (ref 11.0–14.6)
WBC: 4.3 10*3/uL (ref 4.0–10.3)
lymph#: 0.9 10*3/uL (ref 0.9–3.3)

## 2012-08-02 LAB — COMPREHENSIVE METABOLIC PANEL (CC13)
AST: 27 U/L (ref 5–34)
Albumin: 3.4 g/dL — ABNORMAL LOW (ref 3.5–5.0)
Alkaline Phosphatase: 48 U/L (ref 40–150)
BUN: 23 mg/dL (ref 7.0–26.0)
Calcium: 9.8 mg/dL (ref 8.4–10.4)
Chloride: 104 mEq/L (ref 98–107)
Glucose: 108 mg/dl — ABNORMAL HIGH (ref 70–99)
Potassium: 3.7 mEq/L (ref 3.5–5.1)
Sodium: 142 mEq/L (ref 136–145)
Total Protein: 6.6 g/dL (ref 6.4–8.3)

## 2012-08-04 LAB — SPEP & IFE WITH QIG
Albumin ELP: 60.3 % (ref 55.8–66.1)
Beta 2: 3.6 % (ref 3.2–6.5)
Beta Globulin: 5.8 % (ref 4.7–7.2)
IgA: 104 mg/dL (ref 68–379)
IgG (Immunoglobin G), Serum: 1120 mg/dL (ref 650–1600)
M-Spike, %: 0.66 g/dL

## 2012-08-08 DIAGNOSIS — H2510 Age-related nuclear cataract, unspecified eye: Secondary | ICD-10-CM | POA: Diagnosis not present

## 2012-08-08 DIAGNOSIS — H35379 Puckering of macula, unspecified eye: Secondary | ICD-10-CM | POA: Diagnosis not present

## 2012-08-08 DIAGNOSIS — Z961 Presence of intraocular lens: Secondary | ICD-10-CM | POA: Diagnosis not present

## 2012-08-09 ENCOUNTER — Ambulatory Visit: Payer: Medicare Other | Admitting: Hematology and Oncology

## 2012-08-10 ENCOUNTER — Encounter: Payer: Self-pay | Admitting: Oncology

## 2012-08-10 ENCOUNTER — Telehealth: Payer: Self-pay | Admitting: Oncology

## 2012-08-10 ENCOUNTER — Ambulatory Visit (HOSPITAL_BASED_OUTPATIENT_CLINIC_OR_DEPARTMENT_OTHER): Payer: Medicare Other | Admitting: Oncology

## 2012-08-10 VITALS — BP 134/80 | HR 68 | Temp 96.9°F | Resp 18 | Ht 67.0 in | Wt 140.5 lb

## 2012-08-10 DIAGNOSIS — D472 Monoclonal gammopathy: Secondary | ICD-10-CM

## 2012-08-10 DIAGNOSIS — I10 Essential (primary) hypertension: Secondary | ICD-10-CM | POA: Diagnosis not present

## 2012-08-10 NOTE — Progress Notes (Signed)
This office note has been dictated.  #[id number]

## 2012-08-10 NOTE — Telephone Encounter (Signed)
GV AND PRINTED APPT SCHED AND AVS FORPT... °

## 2012-08-11 DIAGNOSIS — L820 Inflamed seborrheic keratosis: Secondary | ICD-10-CM | POA: Diagnosis not present

## 2012-08-11 DIAGNOSIS — L819 Disorder of pigmentation, unspecified: Secondary | ICD-10-CM | POA: Diagnosis not present

## 2012-08-11 DIAGNOSIS — L578 Other skin changes due to chronic exposure to nonionizing radiation: Secondary | ICD-10-CM | POA: Diagnosis not present

## 2012-08-11 DIAGNOSIS — Z1212 Encounter for screening for malignant neoplasm of rectum: Secondary | ICD-10-CM | POA: Diagnosis not present

## 2012-08-11 DIAGNOSIS — L821 Other seborrheic keratosis: Secondary | ICD-10-CM | POA: Diagnosis not present

## 2012-08-11 DIAGNOSIS — D485 Neoplasm of uncertain behavior of skin: Secondary | ICD-10-CM | POA: Diagnosis not present

## 2012-08-11 DIAGNOSIS — Z85828 Personal history of other malignant neoplasm of skin: Secondary | ICD-10-CM | POA: Diagnosis not present

## 2012-08-11 DIAGNOSIS — B351 Tinea unguium: Secondary | ICD-10-CM | POA: Diagnosis not present

## 2012-08-11 DIAGNOSIS — D1801 Hemangioma of skin and subcutaneous tissue: Secondary | ICD-10-CM | POA: Diagnosis not present

## 2012-08-11 NOTE — Progress Notes (Signed)
CC:   Mark A. Perini, M.D. Wilhemina Bonito. Marina Goodell, MD Norval Gable. [place], M.D.  PROBLEM LIST: 1. 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  The patient has never had a bone     marrow exam.  Metastatic series have been negative, most recently     carried out on 08/02/2012.  Quantitative immunoglobulins have been     normal.  IgG level was 1120 on 08/02/2012.  Monoclonal protein on     serum protein electrophoresis was 0.66 g/dL on 16/11/9602. 2. Mild anemia. 3. Hearing impairment requiring bilateral hearing aids, dating back to     childhood. 4. Hypertension. 5. BPH with negative prostate biopsies on 09/30/2000. 6. Dyslipidemia. 7. Remote history of gout. 8. Spinal stenosis. 9. Malignant melanoma involving left forehead in 2009. 10. Sigmoid diverticulosis.  MEDICATIONS:  Reviewed and recorded. Current Outpatient Prescriptions  Medication Sig Dispense Refill  . aspirin 81 MG tablet Take 81 mg by mouth daily.      . calcium-vitamin D (OSCAL WITH D) 500-200 MG-UNIT per tablet Take 1 tablet by mouth daily.      . chlorthalidone (HYGROTON) 25 MG tablet Take 12.5 mg by mouth daily.      . Cholecalciferol (VITAMIN D) 2000 UNITS tablet Take 2,000 Units by mouth daily.      . cyanocobalamin 2000 MCG tablet Take 2,500 mcg by mouth daily.      Marland Kitchen FINASTERIDE PO Take 5 mg by mouth daily.      . Fish Oil OIL 30 mLs by Does not apply route.      . NON FORMULARY Vision Vite - 1 tablet daily      . polyethylene glycol (MIRALAX / GLYCOLAX) packet Take 17 g by mouth daily.      . rosuvastatin (CRESTOR) 5 MG tablet Take 2.5 mg by mouth daily.      . Wheat Dextrin (BENEFIBER DRINK MIX PO) Take 30 mLs by mouth daily.       No current facility-administered medications for this visit.    SMOKING HISTORY:  The patient has never smoked cigarettes.   HISTORY:  I am seeing Illa Level today for followup of an IgG kappa monoclonal gammopathy that was  detected in 2009.  The patient was last seen 1 year ago by Dr. Vicente Serene Odogwu.  The patient recently turned 88. He is an extremely vigorous man for his age.  He plays lots of tennis, walks his dog and is physically active.  He lives by himself and is quite independent and self sufficient.  He is here today with his daughter, Wilson Singer.  The patient says that for the past 2 weeks he has had some soreness of his neck otherwise feels entirely well with no musculoskeletal complaints.  Energy is excellent.  He has no sense of ill health.  Weight has been stable.  PHYSICAL EXAMINATION:  The patient is a well-appearing, tanned elderly gentleman who is quite spry.  Weight is 140 pounds 8 ounces.  Height 5 feet 7 inches.  Body surface area 1.74 sq m.  Blood pressure 134/80. Other vital signs are normal.  He is afebrile.  There is no scleral icterus.  Mouth and pharynx are benign.  No peripheral adenopathy palpable.  Heart and lungs are normal.  Abdomen is benign with no organomegaly or masses palpable.  Extremities:  No peripheral edema or clubbing.  Neurologic exam is normal.  No areas of musculoskeletal tenderness.  LABORATORY DATA:  Laboratory data from 08/02/2012, white count 4.3, ANC 2.8, hemoglobin 12.1, hematocrit 33.7, platelets 195,000.  Chemistries from 04/04/2012 notable for a BUN of 23, creatinine 1.4, albumin 3.4 and total protein 6.6.  Globulins were therefore 3.2.  IgG level 1120, IgA 104, IgM 50, all of which are normal.  Serum immunofixation electrophoresis shows a monoclonal IgG kappa protein.  Serum protein electrophoresis shows a monoclonal protein of 0.66 g/dL.  IMAGING STUDIES:   1. Metastatic bone survey from 08/02/2012 showed no lytic lesions and no evidence for multiple myeloma.   IMPRESSION AND PLAN:  Mr. Sponaugle continues to do well, now 5 years from the time of detection of his monoclonal gammopathy which at this point seems to be benign.  I have asked Mr.  Natzke to return in 1 year, at which time we will check CBC, chemistries, IgG level, quantitative immunoglobulins and serum light chains.  At this point, I think that if there is no laboratory data to suggest progression and probably the patient does not require any further metastatic bone surveys.  Extensive records were reviewed in preparation for this appointment.    ______________________________ Samul Dada, M.D. DSM/MEDQ  D:  08/10/2012  T:  08/11/2012  Job:  [id number]

## 2012-09-07 DIAGNOSIS — H6120 Impacted cerumen, unspecified ear: Secondary | ICD-10-CM | POA: Diagnosis not present

## 2012-10-17 DIAGNOSIS — H6120 Impacted cerumen, unspecified ear: Secondary | ICD-10-CM | POA: Diagnosis not present

## 2012-12-07 DIAGNOSIS — Z23 Encounter for immunization: Secondary | ICD-10-CM | POA: Diagnosis not present

## 2013-03-20 DIAGNOSIS — H6120 Impacted cerumen, unspecified ear: Secondary | ICD-10-CM | POA: Diagnosis not present

## 2013-05-30 DIAGNOSIS — N138 Other obstructive and reflux uropathy: Secondary | ICD-10-CM | POA: Diagnosis not present

## 2013-05-30 DIAGNOSIS — R351 Nocturia: Secondary | ICD-10-CM | POA: Diagnosis not present

## 2013-05-30 DIAGNOSIS — N401 Enlarged prostate with lower urinary tract symptoms: Secondary | ICD-10-CM | POA: Diagnosis not present

## 2013-06-20 DIAGNOSIS — Z961 Presence of intraocular lens: Secondary | ICD-10-CM | POA: Diagnosis not present

## 2013-07-17 DIAGNOSIS — H6120 Impacted cerumen, unspecified ear: Secondary | ICD-10-CM | POA: Diagnosis not present

## 2013-08-01 DIAGNOSIS — E785 Hyperlipidemia, unspecified: Secondary | ICD-10-CM | POA: Diagnosis not present

## 2013-08-01 DIAGNOSIS — I10 Essential (primary) hypertension: Secondary | ICD-10-CM | POA: Diagnosis not present

## 2013-08-06 DIAGNOSIS — Z1212 Encounter for screening for malignant neoplasm of rectum: Secondary | ICD-10-CM | POA: Diagnosis not present

## 2013-08-08 DIAGNOSIS — I10 Essential (primary) hypertension: Secondary | ICD-10-CM | POA: Diagnosis not present

## 2013-08-08 DIAGNOSIS — E785 Hyperlipidemia, unspecified: Secondary | ICD-10-CM | POA: Diagnosis not present

## 2013-08-08 DIAGNOSIS — Z125 Encounter for screening for malignant neoplasm of prostate: Secondary | ICD-10-CM | POA: Diagnosis not present

## 2013-08-08 DIAGNOSIS — M949 Disorder of cartilage, unspecified: Secondary | ICD-10-CM | POA: Diagnosis not present

## 2013-08-08 DIAGNOSIS — Z1331 Encounter for screening for depression: Secondary | ICD-10-CM | POA: Diagnosis not present

## 2013-08-08 DIAGNOSIS — Z0000 Encounter for general adult medical examination without abnormal findings: Secondary | ICD-10-CM | POA: Diagnosis not present

## 2013-08-08 DIAGNOSIS — R7301 Impaired fasting glucose: Secondary | ICD-10-CM | POA: Diagnosis not present

## 2013-08-08 DIAGNOSIS — D649 Anemia, unspecified: Secondary | ICD-10-CM | POA: Diagnosis not present

## 2013-08-08 DIAGNOSIS — M899 Disorder of bone, unspecified: Secondary | ICD-10-CM | POA: Diagnosis not present

## 2013-08-08 DIAGNOSIS — N182 Chronic kidney disease, stage 2 (mild): Secondary | ICD-10-CM | POA: Diagnosis not present

## 2013-08-09 ENCOUNTER — Other Ambulatory Visit (HOSPITAL_BASED_OUTPATIENT_CLINIC_OR_DEPARTMENT_OTHER): Payer: Medicare Other

## 2013-08-09 DIAGNOSIS — D472 Monoclonal gammopathy: Secondary | ICD-10-CM | POA: Diagnosis not present

## 2013-08-09 LAB — COMPREHENSIVE METABOLIC PANEL (CC13)
ALBUMIN: 3.5 g/dL (ref 3.5–5.0)
ALK PHOS: 49 U/L (ref 40–150)
ALT: 17 U/L (ref 0–55)
AST: 24 U/L (ref 5–34)
Anion Gap: 7 mEq/L (ref 3–11)
BUN: 18.7 mg/dL (ref 7.0–26.0)
CO2: 29 mEq/L (ref 22–29)
Calcium: 9.7 mg/dL (ref 8.4–10.4)
Chloride: 107 mEq/L (ref 98–109)
Creatinine: 1.3 mg/dL (ref 0.7–1.3)
GLUCOSE: 113 mg/dL (ref 70–140)
POTASSIUM: 4.1 meq/L (ref 3.5–5.1)
SODIUM: 143 meq/L (ref 136–145)
TOTAL PROTEIN: 6.6 g/dL (ref 6.4–8.3)
Total Bilirubin: 0.43 mg/dL (ref 0.20–1.20)

## 2013-08-09 LAB — CBC WITH DIFFERENTIAL/PLATELET
BASO%: 0.4 % (ref 0.0–2.0)
Basophils Absolute: 0 10*3/uL (ref 0.0–0.1)
EOS ABS: 0.3 10*3/uL (ref 0.0–0.5)
EOS%: 4.9 % (ref 0.0–7.0)
HEMATOCRIT: 35.1 % — AB (ref 38.4–49.9)
HGB: 12 g/dL — ABNORMAL LOW (ref 13.0–17.1)
LYMPH%: 17.6 % (ref 14.0–49.0)
MCH: 32.9 pg (ref 27.2–33.4)
MCHC: 34.2 g/dL (ref 32.0–36.0)
MCV: 96 fL (ref 79.3–98.0)
MONO#: 0.4 10*3/uL (ref 0.1–0.9)
MONO%: 7.1 % (ref 0.0–14.0)
NEUT#: 3.7 10*3/uL (ref 1.5–6.5)
NEUT%: 70 % (ref 39.0–75.0)
PLATELETS: 184 10*3/uL (ref 140–400)
RBC: 3.65 10*6/uL — ABNORMAL LOW (ref 4.20–5.82)
RDW: 13.6 % (ref 11.0–14.6)
WBC: 5.3 10*3/uL (ref 4.0–10.3)
lymph#: 0.9 10*3/uL (ref 0.9–3.3)

## 2013-08-09 LAB — LACTATE DEHYDROGENASE (CC13): LDH: 162 U/L (ref 125–245)

## 2013-08-10 LAB — IGG, IGA, IGM
IGA: 95 mg/dL (ref 68–379)
IgG (Immunoglobin G), Serum: 1130 mg/dL (ref 650–1600)
IgM, Serum: 47 mg/dL (ref 41–251)

## 2013-08-15 DIAGNOSIS — M899 Disorder of bone, unspecified: Secondary | ICD-10-CM | POA: Diagnosis not present

## 2013-08-15 DIAGNOSIS — M949 Disorder of cartilage, unspecified: Secondary | ICD-10-CM | POA: Diagnosis not present

## 2013-08-15 LAB — KAPPA/LAMBDA LIGHT CHAINS
KAPPA FREE LGHT CHN: 2.42 mg/dL — AB (ref 0.33–1.94)
Kappa:Lambda Ratio: 1.57 (ref 0.26–1.65)
Lambda Free Lght Chn: 1.54 mg/dL (ref 0.57–2.63)

## 2013-08-16 ENCOUNTER — Ambulatory Visit: Payer: Medicare Other | Admitting: Oncology

## 2013-08-16 ENCOUNTER — Encounter: Payer: Self-pay | Admitting: Internal Medicine

## 2013-08-16 ENCOUNTER — Ambulatory Visit (HOSPITAL_BASED_OUTPATIENT_CLINIC_OR_DEPARTMENT_OTHER): Payer: Medicare Other | Admitting: Internal Medicine

## 2013-08-16 ENCOUNTER — Telehealth: Payer: Self-pay | Admitting: Internal Medicine

## 2013-08-16 VITALS — BP 166/61 | HR 64 | Temp 97.4°F | Resp 17 | Ht 67.0 in | Wt 137.6 lb

## 2013-08-16 DIAGNOSIS — D649 Anemia, unspecified: Secondary | ICD-10-CM | POA: Diagnosis not present

## 2013-08-16 DIAGNOSIS — D472 Monoclonal gammopathy: Secondary | ICD-10-CM | POA: Diagnosis not present

## 2013-08-16 NOTE — Telephone Encounter (Signed)
gave pt appt for lab and MD for 2016

## 2013-08-16 NOTE — Progress Notes (Signed)
[name] OFFICE PROGRESS NOTE  Donald Ly, MD [place] [address]  DIAGNOSIS: MGUS (monoclonal gammopathy of unknown significance)  ANEMIA-UNSPECIFIED  Chief Complaint  Patient presents with  . MGUS (monoclonal gammopathy of unknown significance)    CURRENT TREATMENT:  Observation.  INTERVAL HISTORY: [name]. 80 y.o. male with a history of IgG kappa MGUS is here for follow up.  The patient recently turned 49.  He is an extremely vigorous man for his age. He plays lots of tennis, walks his dog and is physically active. He lives by himself and is quite independent and self sufficient. He is here today with his daughter, [name]. He denies any musculoskeletal complaints. His energy is excellent. He has no sense of  ill health. Weight has been stable.  MEDICAL HISTORY: Past Medical History  Diagnosis Date  . Anemia   . Cataract   . Arthritis     INTERIM HISTORY: has ANEMIA-UNSPECIFIED; CONSTIPATION; PERSONAL HX COLONIC POLYPS; and MGUS (monoclonal gammopathy of unknown significance) on his problem list.    ALLERGIES:  has No Known Allergies.  MEDICATIONS: has a current medication list which includes the following prescription(s): aspirin, calcium-vitamin d, chlorthalidone, vitamin d, cyanocobalamin, finasteride, fish oil, NON FORMULARY, polyethylene glycol, rosuvastatin, and wheat dextrin.  SURGICAL HISTORY:  Past Surgical History  Procedure Laterality Date  . Cataracts     PROBLEM LIST:  1. 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 The patient has never had a bone  marrow exam. Metastatic series have been negative, most recently  carried out on 08/02/2012. Quantitative immunoglobulins have been  normal. IgG level was 1120 on 08/02/2012. Monoclonal protein on  serum protein electrophoresis was 0.66 g/dL on 08/02/2012.  2. Mild anemia.  3. Hearing impairment requiring  bilateral hearing aids, dating back to  childhood.  4. Hypertension.  5. BPH with negative prostate biopsies on 09/30/2000.  6. Dyslipidemia.  7. Remote history of gout.  8. Spinal stenosis.  9. Malignant melanoma involving left forehead in 2009.  10. Sigmoid diverticulosis.  REVIEW OF SYSTEMS:   Constitutional: Denies fevers, chills or abnormal weight loss Eyes: Denies blurriness of vision Ears, nose, mouth, throat, and face: Denies mucositis or sore throat Respiratory: Denies cough, dyspnea or wheezes Cardiovascular: Denies palpitation, chest discomfort or lower extremity swelling Gastrointestinal:  Denies nausea, heartburn or change in bowel habits Skin: Denies abnormal skin rashes Lymphatics: Denies new lymphadenopathy or easy bruising Neurological:Denies numbness, tingling or new weaknesses Behavioral/Psych: Mood is stable, no new changes  All other systems were reviewed with the patient and are negative.  PHYSICAL EXAMINATION: ECOG PERFORMANCE STATUS: 0 - Asymptomatic  Blood pressure 166/61, pulse 64, temperature 97.4 F (36.3 C), temperature source Oral, resp. rate 17, height 5\' 7"  (1.702 m), weight 137 lb 9.6 oz (62.415 kg).  GENERAL:alert, no distress and comfortable; elderly male who is easily mobile; hearing aides SKIN: skin color, texture, turgor are normal, no rashes or significant lesions EYES: normal, Conjunctiva are pink and non-injected, sclera clear OROPHARYNX:no exudate, no erythema and lips, buccal mucosa, and tongue normal  NECK: supple, thyroid normal size, non-tender, without nodularity LYMPH:  no palpable lymphadenopathy in the cervical, axillary or supraclavicular LUNGS: clear to auscultation with normal breathing effort, no wheezes or rhonchi HEART: regular rate & rhythm and no murmurs and no lower extremity edema ABDOMEN:abdomen soft, non-tender and normal bowel sounds Musculoskeletal:no cyanosis of digits and no clubbing  NEURO: alert &  oriented x 3  with fluent speech, no focal motor/sensory deficits  Labs:  Lab Results  Component Value Date   WBC 5.3 08/09/2013   HGB 12.0* 08/09/2013   HCT 35.1* 08/09/2013   MCV 96.0 08/09/2013   PLT 184 08/09/2013   NEUTROABS 3.7 08/09/2013      Chemistry      Component Value Date/Time   NA 143 08/09/2013 0811   NA 141 08/04/2011 1243   K 4.1 08/09/2013 0811   K 3.6 08/04/2011 1243   CL 104 08/02/2012 0836   CL 104 08/04/2011 1243   CO2 29 08/09/2013 0811   CO2 31 08/04/2011 1243   BUN 18.7 08/09/2013 0811   BUN 21 08/04/2011 1243   CREATININE 1.3 08/09/2013 0811   CREATININE 1.18 08/04/2011 1243      Component Value Date/Time   CALCIUM 9.7 08/09/2013 0811   CALCIUM 10.1 08/04/2011 1243   ALKPHOS 49 08/09/2013 0811   ALKPHOS 46 08/04/2011 1243   AST 24 08/09/2013 0811   AST 29 08/04/2011 1243   ALT 17 08/09/2013 0811   ALT 24 08/04/2011 1243   BILITOT 0.43 08/09/2013 0811   BILITOT 0.5 08/04/2011 1243       Basic Metabolic Panel: No results found for this basename: NA, K, CL, CO2, GLUCOSE, BUN, CREATININE, CALCIUM, MG, PHOS,  in the last 168 hours GFR Estimated Creatinine Clearance: 35.3 ml/min (by C-G formula based on Cr of 1.3). Liver Function Tests: No results found for this basename: AST, ALT, ALKPHOS, BILITOT, PROT, ALBUMIN,  in the last 168 hours No results found for this basename: LIPASE, AMYLASE,  in the last 168 hours No results found for this basename: AMMONIA,  in the last 168 hours Coagulation profile No results found for this basename: INR, PROTIME,  in the last 168 hours  CBC: No results found for this basename: WBC, NEUTROABS, HGB, HCT, MCV, PLT,  in the last 168 hours  Anemia work up No results found for this basename: VITAMINB12, FOLATE, FERRITIN, TIBC, IRON, RETICCTPCT,  in the last 72 hours  Studies:  No results found.   RADIOGRAPHIC STUDIES: Skeletal survey (08/02/2012) RADIOLOGY REPORT*  Clinical Data: Monoclonal gammopathy  METASTATIC BONE SURVEY  Comparison: 02/04/2010.   Findings: No lytic myelomatous lesions are identified in the axial  or appendicular skeleton. Stable degenerative changes involving  the spine. Stable aortic calcifications without definite aneurysm.  Rounded calcification in the left lower quadrant of the abdomen is  again demonstrated. It could be a a rim calcified left renal cyst.  IMPRESSION:  Negative bone survey for lytic myelomatous bone lesions.  Original Report Authenticated By: Marijo Sanes, M.D.   ASSESSMENT: [name] 80 y.o. male with a history of MGUS (monoclonal gammopathy of unknown significance)  ANEMIA-UNSPECIFIED   PLAN:   1. IgG Kappa MGUS. --[name] continues to do well, now 6 years from the time of detection of his monoclonal gammopathy which at this point seems to be benign. His creatinine is 1.3.  His hemoglobin is 12.  His calcium is within normal limits.  At this point, I think that if there is no laboratory data to suggest progression and probably the patient does not require any further metastatic bone surveys.  2. Follow up.  -- He will  return in 1 year, at which time we will check CBC, chemistries, IgG level, quantitative immunoglobulins and serum light chains. Extensive records were reviewed in preparation for this appointment  All questions were answered. The  patient knows to call the clinic with any problems, questions or concerns. We can certainly see the patient much sooner if necessary.  I spent 15 minutes counseling the patient face to face. The total time spent in the appointment was 25 minutes.    CHISM, DAVID, MD 08/16/2013 11:00 AM

## 2013-08-17 DIAGNOSIS — L819 Disorder of pigmentation, unspecified: Secondary | ICD-10-CM | POA: Diagnosis not present

## 2013-08-17 DIAGNOSIS — Z85828 Personal history of other malignant neoplasm of skin: Secondary | ICD-10-CM | POA: Diagnosis not present

## 2013-08-17 DIAGNOSIS — L570 Actinic keratosis: Secondary | ICD-10-CM | POA: Diagnosis not present

## 2013-08-17 DIAGNOSIS — D1801 Hemangioma of skin and subcutaneous tissue: Secondary | ICD-10-CM | POA: Diagnosis not present

## 2013-08-17 DIAGNOSIS — Z85820 Personal history of malignant melanoma of skin: Secondary | ICD-10-CM | POA: Diagnosis not present

## 2013-08-17 DIAGNOSIS — L821 Other seborrheic keratosis: Secondary | ICD-10-CM | POA: Diagnosis not present

## 2013-08-24 DIAGNOSIS — L608 Other nail disorders: Secondary | ICD-10-CM | POA: Diagnosis not present

## 2013-10-18 DIAGNOSIS — H6120 Impacted cerumen, unspecified ear: Secondary | ICD-10-CM | POA: Diagnosis not present

## 2013-11-15 DIAGNOSIS — H35379 Puckering of macula, unspecified eye: Secondary | ICD-10-CM | POA: Diagnosis not present

## 2013-11-15 DIAGNOSIS — H35369 Drusen (degenerative) of macula, unspecified eye: Secondary | ICD-10-CM | POA: Diagnosis not present

## 2013-11-15 DIAGNOSIS — H4720 Unspecified optic atrophy: Secondary | ICD-10-CM | POA: Diagnosis not present

## 2013-12-19 DIAGNOSIS — Z23 Encounter for immunization: Secondary | ICD-10-CM | POA: Diagnosis not present

## 2014-01-21 DIAGNOSIS — H6121 Impacted cerumen, right ear: Secondary | ICD-10-CM | POA: Diagnosis not present

## 2014-05-21 DIAGNOSIS — H6121 Impacted cerumen, right ear: Secondary | ICD-10-CM | POA: Diagnosis not present

## 2014-05-30 DIAGNOSIS — H4321 Crystalline deposits in vitreous body, right eye: Secondary | ICD-10-CM | POA: Diagnosis not present

## 2014-06-05 DIAGNOSIS — N138 Other obstructive and reflux uropathy: Secondary | ICD-10-CM | POA: Diagnosis not present

## 2014-06-05 DIAGNOSIS — N401 Enlarged prostate with lower urinary tract symptoms: Secondary | ICD-10-CM | POA: Diagnosis not present

## 2014-06-05 DIAGNOSIS — R351 Nocturia: Secondary | ICD-10-CM | POA: Diagnosis not present

## 2014-07-10 ENCOUNTER — Telehealth: Payer: Self-pay | Admitting: Oncology

## 2014-07-10 NOTE — Telephone Encounter (Signed)
Spoke with patient and he is aware of his appointments and i have mailed a new calendar as well  anne

## 2014-07-24 ENCOUNTER — Telehealth: Payer: Self-pay | Admitting: Oncology

## 2014-07-24 NOTE — Telephone Encounter (Signed)
Called patient and he is aware of his new appointment,a calendar was mailed as well

## 2014-08-21 DIAGNOSIS — R7301 Impaired fasting glucose: Secondary | ICD-10-CM | POA: Diagnosis not present

## 2014-08-21 DIAGNOSIS — E785 Hyperlipidemia, unspecified: Secondary | ICD-10-CM | POA: Diagnosis not present

## 2014-08-21 DIAGNOSIS — M859 Disorder of bone density and structure, unspecified: Secondary | ICD-10-CM | POA: Diagnosis not present

## 2014-08-21 DIAGNOSIS — I10 Essential (primary) hypertension: Secondary | ICD-10-CM | POA: Diagnosis not present

## 2014-08-23 DIAGNOSIS — H35371 Puckering of macula, right eye: Secondary | ICD-10-CM | POA: Diagnosis not present

## 2014-08-23 DIAGNOSIS — H35373 Puckering of macula, bilateral: Secondary | ICD-10-CM | POA: Diagnosis not present

## 2014-08-28 DIAGNOSIS — R7301 Impaired fasting glucose: Secondary | ICD-10-CM | POA: Diagnosis not present

## 2014-08-28 DIAGNOSIS — Z0000 Encounter for general adult medical examination without abnormal findings: Secondary | ICD-10-CM | POA: Diagnosis not present

## 2014-08-28 DIAGNOSIS — N182 Chronic kidney disease, stage 2 (mild): Secondary | ICD-10-CM | POA: Diagnosis not present

## 2014-08-28 DIAGNOSIS — M4806 Spinal stenosis, lumbar region: Secondary | ICD-10-CM | POA: Diagnosis not present

## 2014-08-28 DIAGNOSIS — D472 Monoclonal gammopathy: Secondary | ICD-10-CM | POA: Diagnosis not present

## 2014-08-28 DIAGNOSIS — M5416 Radiculopathy, lumbar region: Secondary | ICD-10-CM | POA: Diagnosis not present

## 2014-08-28 DIAGNOSIS — I10 Essential (primary) hypertension: Secondary | ICD-10-CM | POA: Diagnosis not present

## 2014-08-28 DIAGNOSIS — M859 Disorder of bone density and structure, unspecified: Secondary | ICD-10-CM | POA: Diagnosis not present

## 2014-08-28 DIAGNOSIS — E785 Hyperlipidemia, unspecified: Secondary | ICD-10-CM | POA: Diagnosis not present

## 2014-08-28 DIAGNOSIS — Z1389 Encounter for screening for other disorder: Secondary | ICD-10-CM | POA: Diagnosis not present

## 2014-08-28 DIAGNOSIS — Z6821 Body mass index (BMI) 21.0-21.9, adult: Secondary | ICD-10-CM | POA: Diagnosis not present

## 2014-08-28 DIAGNOSIS — M179 Osteoarthritis of knee, unspecified: Secondary | ICD-10-CM | POA: Diagnosis not present

## 2014-08-30 DIAGNOSIS — L570 Actinic keratosis: Secondary | ICD-10-CM | POA: Diagnosis not present

## 2014-08-30 DIAGNOSIS — L821 Other seborrheic keratosis: Secondary | ICD-10-CM | POA: Diagnosis not present

## 2014-08-30 DIAGNOSIS — Z85820 Personal history of malignant melanoma of skin: Secondary | ICD-10-CM | POA: Diagnosis not present

## 2014-08-30 DIAGNOSIS — Z85828 Personal history of other malignant neoplasm of skin: Secondary | ICD-10-CM | POA: Diagnosis not present

## 2014-08-30 DIAGNOSIS — D1801 Hemangioma of skin and subcutaneous tissue: Secondary | ICD-10-CM | POA: Diagnosis not present

## 2014-08-30 DIAGNOSIS — D225 Melanocytic nevi of trunk: Secondary | ICD-10-CM | POA: Diagnosis not present

## 2014-09-03 ENCOUNTER — Other Ambulatory Visit: Payer: Self-pay | Admitting: Oncology

## 2014-09-04 ENCOUNTER — Other Ambulatory Visit: Payer: Medicare Other

## 2014-09-04 ENCOUNTER — Telehealth: Payer: Self-pay | Admitting: Oncology

## 2014-09-04 NOTE — Telephone Encounter (Signed)
Left message to confirm appointment reschedule per pof. Mailed calendar for 07/26 & 08/02.

## 2014-09-10 ENCOUNTER — Ambulatory Visit: Payer: Medicare Other

## 2014-09-11 ENCOUNTER — Ambulatory Visit: Payer: Medicare Other | Admitting: Oncology

## 2014-09-11 ENCOUNTER — Other Ambulatory Visit: Payer: Medicare Other

## 2014-09-11 DIAGNOSIS — Z1212 Encounter for screening for malignant neoplasm of rectum: Secondary | ICD-10-CM | POA: Diagnosis not present

## 2014-09-11 DIAGNOSIS — H6122 Impacted cerumen, left ear: Secondary | ICD-10-CM | POA: Diagnosis not present

## 2014-09-18 ENCOUNTER — Ambulatory Visit: Payer: Medicare Other | Admitting: Oncology

## 2014-09-24 ENCOUNTER — Other Ambulatory Visit: Payer: Self-pay | Admitting: Oncology

## 2014-09-24 ENCOUNTER — Other Ambulatory Visit (HOSPITAL_BASED_OUTPATIENT_CLINIC_OR_DEPARTMENT_OTHER): Payer: Medicare Other

## 2014-09-24 DIAGNOSIS — D472 Monoclonal gammopathy: Secondary | ICD-10-CM

## 2014-09-24 LAB — CBC WITH DIFFERENTIAL/PLATELET
BASO%: 0.5 % (ref 0.0–2.0)
BASOS ABS: 0 10*3/uL (ref 0.0–0.1)
EOS ABS: 0.3 10*3/uL (ref 0.0–0.5)
EOS%: 6.1 % (ref 0.0–7.0)
HCT: 34.5 % — ABNORMAL LOW (ref 38.4–49.9)
HEMOGLOBIN: 12 g/dL — AB (ref 13.0–17.1)
LYMPH%: 29.9 % (ref 14.0–49.0)
MCH: 33.6 pg — ABNORMAL HIGH (ref 27.2–33.4)
MCHC: 34.8 g/dL (ref 32.0–36.0)
MCV: 96.6 fL (ref 79.3–98.0)
MONO#: 0.3 10*3/uL (ref 0.1–0.9)
MONO%: 6.6 % (ref 0.0–14.0)
NEUT#: 2.4 10*3/uL (ref 1.5–6.5)
NEUT%: 56.9 % (ref 39.0–75.0)
Platelets: 158 10*3/uL (ref 140–400)
RBC: 3.57 10*6/uL — ABNORMAL LOW (ref 4.20–5.82)
RDW: 13.2 % (ref 11.0–14.6)
WBC: 4.1 10*3/uL (ref 4.0–10.3)
lymph#: 1.2 10*3/uL (ref 0.9–3.3)

## 2014-09-24 LAB — COMPREHENSIVE METABOLIC PANEL (CC13)
ALK PHOS: 48 U/L (ref 40–150)
ALT: 18 U/L (ref 0–55)
ANION GAP: 6 meq/L (ref 3–11)
AST: 25 U/L (ref 5–34)
Albumin: 3.6 g/dL (ref 3.5–5.0)
BUN: 21.8 mg/dL (ref 7.0–26.0)
CALCIUM: 9.7 mg/dL (ref 8.4–10.4)
CO2: 28 mEq/L (ref 22–29)
Chloride: 107 mEq/L (ref 98–109)
Creatinine: 1.5 mg/dL — ABNORMAL HIGH (ref 0.7–1.3)
EGFR: 42 mL/min/{1.73_m2} — AB (ref >90)
Glucose: 107 mg/dl (ref 70–140)
POTASSIUM: 4.3 meq/L (ref 3.5–5.1)
Sodium: 141 mEq/L (ref 136–145)
Total Bilirubin: 0.51 mg/dL (ref 0.20–1.20)
Total Protein: 6.4 g/dL (ref 6.4–8.3)

## 2014-09-27 LAB — SPEP & IFE WITH QIG
ALPHA-2-GLOBULIN: 0.6 g/dL (ref 0.5–0.9)
Abnormal Protein Band1: 0.6 g/dL
Albumin ELP: 3.7 g/dL — ABNORMAL LOW (ref 3.8–4.8)
Alpha-1-Globulin: 0.3 g/dL (ref 0.2–0.3)
BETA 2: 0.2 g/dL (ref 0.2–0.5)
BETA GLOBULIN: 0.4 g/dL (ref 0.4–0.6)
GAMMA GLOBULIN: 1.1 g/dL (ref 0.8–1.7)
IGA: 97 mg/dL (ref 68–379)
IGG (IMMUNOGLOBIN G), SERUM: 1180 mg/dL (ref 650–1600)
IGM, SERUM: 48 mg/dL (ref 41–251)
TOTAL PROTEIN, SERUM ELECTROPHOR: 6.2 g/dL (ref 6.1–8.1)

## 2014-10-01 ENCOUNTER — Telehealth: Payer: Self-pay | Admitting: Oncology

## 2014-10-01 ENCOUNTER — Ambulatory Visit (HOSPITAL_BASED_OUTPATIENT_CLINIC_OR_DEPARTMENT_OTHER): Payer: Medicare Other | Admitting: Oncology

## 2014-10-01 VITALS — BP 164/71 | HR 66 | Temp 97.6°F | Resp 18 | Ht 67.0 in | Wt 137.9 lb

## 2014-10-01 DIAGNOSIS — D472 Monoclonal gammopathy: Secondary | ICD-10-CM

## 2014-10-01 NOTE — Progress Notes (Signed)
[facility] OFFICE PROGRESS NOTE  Jerlyn Ly, MD [place] [address]  DIAGNOSIS: 81 year old gentleman with IgG kappa MGUS dating back to at least 2009. He has been on active surveillance since that time without any evidence of multiple myeloma. His M spike had been around 0.6 g/dL and have not changed.   CURRENT TREATMENT:  Observation.  INTERVAL HISTORY: Mr. Fedewa presents today for a follow-up visit with his daughter. He is a pleasant gentleman with the above diagnosis that have been followed with multiple physicians in the past. He is establishing care with me for the first time. He was last seen in 2015 for this problem. Since his last visit, he is completely asymptomatic. He has not reported any peripheral neuropathy, opportunistic infections, pathological fractures or constitutional symptoms. He continues to be active and lives independently. He plays tennis regularly and have not reported any decline in his health. He does have chronic hearing loss which have not changed.  He does not report any headaches, blurry vision, syncope or seizures. He does not report any fevers, chills, sweats or weight loss. He does not report any chest pain, palpitation, orthopnea or leg edema. He does not report any nausea, vomiting or abdominal pain. He does not report any frequency urgency or hesitancy. He does not report any skeletal complaints. Remaining review of systems unremarkable.  MEDICAL HISTORY: Past Medical History  Diagnosis Date  . Anemia   . Cataract   . Arthritis      ALLERGIES:  has No Known Allergies.  MEDICATIONS:   Current Outpatient Prescriptions  Medication Sig Dispense Refill  . aspirin 81 MG tablet Take 81 mg by mouth daily.    . Calcium Citrate-Vitamin D (CALCIUM + D PO) Take 1,000 mg by mouth daily.    . chlorthalidone (HYGROTON) 25 MG tablet Take 12.5 mg by mouth daily.    . Cholecalciferol (VITAMIN D) 2000 UNITS tablet Take 2,500  Units by mouth daily.     . cyanocobalamin 2000 MCG tablet Take 2,500 mcg by mouth daily.    Marland Kitchen FINASTERIDE PO Take 5 mg by mouth daily.    . Fish Oil OIL 30 mLs by Does not apply route.    . NON FORMULARY Vision Vite - 1 tablet daily    . polyethylene glycol (MIRALAX / GLYCOLAX) packet Take 17 g by mouth daily.    . rosuvastatin (CRESTOR) 5 MG tablet Take 5 mg by mouth daily.    . Wheat Dextrin (BENEFIBER DRINK MIX PO) Take 30 mLs by mouth daily.     No current facility-administered medications for this visit.    SURGICAL HISTORY:  Past Surgical History  Procedure Laterality Date  . Cataracts       PHYSICAL EXAMINATION: ECOG PERFORMANCE STATUS: 0 - Asymptomatic  Blood pressure 164/71, pulse 66, temperature 97.6 F (36.4 C), temperature source Oral, resp. rate 18, height '5\' 7"'  (1.702 m), weight 137 lb 14.4 oz (62.551 kg), SpO2 99 %.  GENERAL:alert, no distress and comfortable.  SKIN: skin color, texture, turgor are normal, no rashes or significant lesions EYES: normal, Conjunctiva are pink and non-injected, sclera clear OROPHARYNX:no exudate, no oral thrush noted. NECK: supple, thyroid normal size, no lymphadenopathy. LYMPH:  no palpable lymphadenopathy in the cervical, axillary or supraclavicular LUNGS: clear to auscultation with normal breathing effort, no wheezes or rhonchi HEART: regular rate & rhythm and no murmurs and no lower extremity edema ABDOMEN:abdomen soft, non-tender and normal bowel sounds Musculoskeletal:no cyanosis of digits  and no clubbing  NEURO: alert & oriented x 3 with fluent speech, no focal motor/sensory deficits  Labs:  Lab Results  Component Value Date   WBC 4.1 09/24/2014   HGB 12.0* 09/24/2014   HCT 34.5* 09/24/2014   MCV 96.6 09/24/2014   PLT 158 09/24/2014   NEUTROABS 2.4 09/24/2014      Chemistry      Component Value Date/Time   NA 141 09/24/2014 0803   NA 141 08/04/2011 1243   K 4.3 09/24/2014 0803   K 3.6 08/04/2011 1243   CL 104  08/02/2012 0836   CL 104 08/04/2011 1243   CO2 28 09/24/2014 0803   CO2 31 08/04/2011 1243   BUN 21.8 09/24/2014 0803   BUN 21 08/04/2011 1243   CREATININE 1.5* 09/24/2014 0803   CREATININE 1.18 08/04/2011 1243      Component Value Date/Time   CALCIUM 9.7 09/24/2014 0803   CALCIUM 10.1 08/04/2011 1243   ALKPHOS 48 09/24/2014 0803   ALKPHOS 46 08/04/2011 1243   AST 25 09/24/2014 0803   AST 29 08/04/2011 1243   ALT 18 09/24/2014 0803   ALT 24 08/04/2011 1243   BILITOT 0.51 09/24/2014 0803   BILITOT 0.5 08/04/2011 1243      Results for Carillo, Ericka Pontiff (MRN [id number]) as of 10/01/2014 09:57  Ref. Range 08/02/2012 08:36 08/09/2013 08:11 08/09/2013 08:12 09/24/2014 08:03  IgG (Immunoglobin G), Serum Latest Ref Range: [id number] mg/dL 1120  1130 1180  IgA Latest Ref Range: 68-379 mg/dL 104  95 97  IgM, Serum Latest Ref Range: 41-251 mg/dL 50  47 48  Total Protein, Serum Electrophoresis Latest Ref Range: 6.1-8.1 g/dL 6.2   6.2    RADIOGRAPHIC STUDIES: Skeletal survey (08/02/2012) RADIOLOGY REPORT*  Clinical Data: Monoclonal gammopathy  METASTATIC BONE SURVEY  Comparison: 02/04/2010.  Findings: No lytic myelomatous lesions are identified in the axial  or appendicular skeleton. Stable degenerative changes involving  the spine. Stable aortic calcifications without definite aneurysm.  Rounded calcification in the left lower quadrant of the abdomen is  again demonstrated. It could be a a rim calcified left renal cyst.  IMPRESSION:  Negative bone survey for lytic myelomatous bone lesions.  Original Report Authenticated By: Marijo Sanes, M.D.  Assessment and plan:   81 year old gentleman with the following issues:  1. IgG Kappa MGUS. This was diagnosed at least since 2009. Over the last 7 years, his spike have not changed close to 0.6 g/dL. His risk of evolving into multiple myeloma is very small close to 1% per year. I see no signs or symptoms to suggest that for the time being. I see  no need to restage him with a skeletal survey ordered a bone marrow biopsy.  2. Follow up: I am more than happy to keep checking his counts on an annual basis but we are doing. I also given the options to follow-up with Dr. Joylene Draft with repeat laboratory testing including CBC and chemistries. And certainly he can be referred back here as needed in the future.  He will think about this option for the time being. I have made an appointment for him for August 2017 and I told him that he is still free to cancel that if he prefers not to follow-up here any further.    [facility], MD 10/01/2014 10:14 AM

## 2014-10-01 NOTE — Telephone Encounter (Signed)
Pt confirmed labs/ov per 08/02 POF, gave pt avs and calendar.... KJ

## 2014-12-12 DIAGNOSIS — H9041 Sensorineural hearing loss, unilateral, right ear, with unrestricted hearing on the contralateral side: Secondary | ICD-10-CM | POA: Diagnosis not present

## 2014-12-12 DIAGNOSIS — H6121 Impacted cerumen, right ear: Secondary | ICD-10-CM | POA: Diagnosis not present

## 2014-12-12 DIAGNOSIS — H903 Sensorineural hearing loss, bilateral: Secondary | ICD-10-CM | POA: Diagnosis not present

## 2014-12-18 DIAGNOSIS — Z23 Encounter for immunization: Secondary | ICD-10-CM | POA: Diagnosis not present

## 2015-03-11 ENCOUNTER — Encounter: Payer: Self-pay | Admitting: Internal Medicine

## 2015-03-28 DIAGNOSIS — H43393 Other vitreous opacities, bilateral: Secondary | ICD-10-CM | POA: Diagnosis not present

## 2015-04-08 DIAGNOSIS — M25552 Pain in left hip: Secondary | ICD-10-CM | POA: Diagnosis not present

## 2015-04-10 DIAGNOSIS — H6122 Impacted cerumen, left ear: Secondary | ICD-10-CM | POA: Diagnosis not present

## 2015-04-11 DIAGNOSIS — M24152 Other articular cartilage disorders, left hip: Secondary | ICD-10-CM | POA: Diagnosis not present

## 2015-04-11 DIAGNOSIS — M76892 Other specified enthesopathies of left lower limb, excluding foot: Secondary | ICD-10-CM | POA: Diagnosis not present

## 2015-04-11 DIAGNOSIS — N400 Enlarged prostate without lower urinary tract symptoms: Secondary | ICD-10-CM | POA: Diagnosis not present

## 2015-04-15 DIAGNOSIS — M25452 Effusion, left hip: Secondary | ICD-10-CM | POA: Diagnosis not present

## 2015-04-15 DIAGNOSIS — M25552 Pain in left hip: Secondary | ICD-10-CM | POA: Diagnosis not present

## 2015-04-24 DIAGNOSIS — M25552 Pain in left hip: Secondary | ICD-10-CM | POA: Diagnosis not present

## 2015-04-24 DIAGNOSIS — M79652 Pain in left thigh: Secondary | ICD-10-CM | POA: Diagnosis not present

## 2015-06-10 DIAGNOSIS — Z0000 Encounter for general adult medical examination without abnormal findings: Secondary | ICD-10-CM | POA: Diagnosis not present

## 2015-06-10 DIAGNOSIS — R351 Nocturia: Secondary | ICD-10-CM | POA: Diagnosis not present

## 2015-06-10 DIAGNOSIS — N138 Other obstructive and reflux uropathy: Secondary | ICD-10-CM | POA: Diagnosis not present

## 2015-06-10 DIAGNOSIS — N401 Enlarged prostate with lower urinary tract symptoms: Secondary | ICD-10-CM | POA: Diagnosis not present

## 2015-07-21 DIAGNOSIS — H6061 Unspecified chronic otitis externa, right ear: Secondary | ICD-10-CM | POA: Diagnosis not present

## 2015-07-21 DIAGNOSIS — H9042 Sensorineural hearing loss, unilateral, left ear, with unrestricted hearing on the contralateral side: Secondary | ICD-10-CM | POA: Diagnosis not present

## 2015-07-21 DIAGNOSIS — H6022 Malignant otitis externa, left ear: Secondary | ICD-10-CM | POA: Diagnosis not present

## 2015-08-08 DIAGNOSIS — H43393 Other vitreous opacities, bilateral: Secondary | ICD-10-CM | POA: Diagnosis not present

## 2015-08-27 DIAGNOSIS — M859 Disorder of bone density and structure, unspecified: Secondary | ICD-10-CM | POA: Diagnosis not present

## 2015-08-27 DIAGNOSIS — E784 Other hyperlipidemia: Secondary | ICD-10-CM | POA: Diagnosis not present

## 2015-08-27 DIAGNOSIS — I10 Essential (primary) hypertension: Secondary | ICD-10-CM | POA: Diagnosis not present

## 2015-08-27 DIAGNOSIS — R7301 Impaired fasting glucose: Secondary | ICD-10-CM | POA: Diagnosis not present

## 2015-08-27 DIAGNOSIS — Z125 Encounter for screening for malignant neoplasm of prostate: Secondary | ICD-10-CM | POA: Diagnosis not present

## 2015-09-01 DIAGNOSIS — H35361 Drusen (degenerative) of macula, right eye: Secondary | ICD-10-CM | POA: Diagnosis not present

## 2015-09-01 DIAGNOSIS — H43811 Vitreous degeneration, right eye: Secondary | ICD-10-CM | POA: Diagnosis not present

## 2015-09-01 DIAGNOSIS — H35371 Puckering of macula, right eye: Secondary | ICD-10-CM | POA: Diagnosis not present

## 2015-09-01 DIAGNOSIS — H47292 Other optic atrophy, left eye: Secondary | ICD-10-CM | POA: Diagnosis not present

## 2015-09-03 DIAGNOSIS — Z6820 Body mass index (BMI) 20.0-20.9, adult: Secondary | ICD-10-CM | POA: Diagnosis not present

## 2015-09-03 DIAGNOSIS — M859 Disorder of bone density and structure, unspecified: Secondary | ICD-10-CM | POA: Diagnosis not present

## 2015-09-03 DIAGNOSIS — M25552 Pain in left hip: Secondary | ICD-10-CM | POA: Diagnosis not present

## 2015-09-03 DIAGNOSIS — D6489 Other specified anemias: Secondary | ICD-10-CM | POA: Diagnosis not present

## 2015-09-03 DIAGNOSIS — M179 Osteoarthritis of knee, unspecified: Secondary | ICD-10-CM | POA: Diagnosis not present

## 2015-09-03 DIAGNOSIS — Z1389 Encounter for screening for other disorder: Secondary | ICD-10-CM | POA: Diagnosis not present

## 2015-09-03 DIAGNOSIS — M5416 Radiculopathy, lumbar region: Secondary | ICD-10-CM | POA: Diagnosis not present

## 2015-09-03 DIAGNOSIS — Z0000 Encounter for general adult medical examination without abnormal findings: Secondary | ICD-10-CM | POA: Diagnosis not present

## 2015-09-03 DIAGNOSIS — N183 Chronic kidney disease, stage 3 (moderate): Secondary | ICD-10-CM | POA: Diagnosis not present

## 2015-09-03 DIAGNOSIS — I10 Essential (primary) hypertension: Secondary | ICD-10-CM | POA: Diagnosis not present

## 2015-09-03 DIAGNOSIS — R7301 Impaired fasting glucose: Secondary | ICD-10-CM | POA: Diagnosis not present

## 2015-09-03 DIAGNOSIS — E784 Other hyperlipidemia: Secondary | ICD-10-CM | POA: Diagnosis not present

## 2015-09-04 ENCOUNTER — Encounter: Payer: Self-pay | Admitting: *Deleted

## 2015-09-10 DIAGNOSIS — L570 Actinic keratosis: Secondary | ICD-10-CM | POA: Diagnosis not present

## 2015-09-10 DIAGNOSIS — L821 Other seborrheic keratosis: Secondary | ICD-10-CM | POA: Diagnosis not present

## 2015-09-10 DIAGNOSIS — D225 Melanocytic nevi of trunk: Secondary | ICD-10-CM | POA: Diagnosis not present

## 2015-09-10 DIAGNOSIS — L814 Other melanin hyperpigmentation: Secondary | ICD-10-CM | POA: Diagnosis not present

## 2015-09-10 DIAGNOSIS — L820 Inflamed seborrheic keratosis: Secondary | ICD-10-CM | POA: Diagnosis not present

## 2015-09-10 DIAGNOSIS — D485 Neoplasm of uncertain behavior of skin: Secondary | ICD-10-CM | POA: Diagnosis not present

## 2015-09-10 DIAGNOSIS — Z85828 Personal history of other malignant neoplasm of skin: Secondary | ICD-10-CM | POA: Diagnosis not present

## 2015-09-23 ENCOUNTER — Telehealth: Payer: Self-pay | Admitting: Oncology

## 2015-09-23 ENCOUNTER — Encounter: Payer: Self-pay | Admitting: *Deleted

## 2015-09-23 NOTE — Telephone Encounter (Signed)
Appointment confirmed with patient. Appointment letter and schedule mailed per patient request. Donald F. °

## 2015-10-01 ENCOUNTER — Other Ambulatory Visit (HOSPITAL_BASED_OUTPATIENT_CLINIC_OR_DEPARTMENT_OTHER): Payer: Medicare Other

## 2015-10-01 DIAGNOSIS — D472 Monoclonal gammopathy: Secondary | ICD-10-CM

## 2015-10-01 LAB — COMPREHENSIVE METABOLIC PANEL
ALT: 15 U/L (ref 0–55)
AST: 22 U/L (ref 5–34)
Albumin: 3.5 g/dL (ref 3.5–5.0)
Alkaline Phosphatase: 57 U/L (ref 40–150)
Anion Gap: 7 mEq/L (ref 3–11)
BUN: 22.4 mg/dL (ref 7.0–26.0)
CO2: 30 mEq/L — ABNORMAL HIGH (ref 22–29)
Calcium: 10.2 mg/dL (ref 8.4–10.4)
Chloride: 105 mEq/L (ref 98–109)
Creatinine: 1.3 mg/dL (ref 0.7–1.3)
EGFR: 51 mL/min/{1.73_m2} — ABNORMAL LOW (ref >90)
Glucose: 101 mg/dl (ref 70–140)
Potassium: 4.1 mEq/L (ref 3.5–5.1)
Sodium: 142 mEq/L (ref 136–145)
Total Bilirubin: 0.47 mg/dL (ref 0.20–1.20)
Total Protein: 6.5 g/dL (ref 6.4–8.3)

## 2015-10-01 LAB — CBC WITH DIFFERENTIAL/PLATELET
BASO%: 0.2 % (ref 0.0–2.0)
Basophils Absolute: 0 10*3/uL (ref 0.0–0.1)
EOS%: 1.8 % (ref 0.0–7.0)
Eosinophils Absolute: 0.1 10*3/uL (ref 0.0–0.5)
HCT: 32.7 % — ABNORMAL LOW (ref 38.4–49.9)
HGB: 11.1 g/dL — ABNORMAL LOW (ref 13.0–17.1)
LYMPH#: 0.9 10*3/uL (ref 0.9–3.3)
LYMPH%: 19.8 % (ref 14.0–49.0)
MCH: 32.7 pg (ref 27.2–33.4)
MCHC: 33.9 g/dL (ref 32.0–36.0)
MCV: 96.5 fL (ref 79.3–98.0)
MONO#: 0.3 10*3/uL (ref 0.1–0.9)
MONO%: 7.3 % (ref 0.0–14.0)
NEUT#: 3.2 10*3/uL (ref 1.5–6.5)
NEUT%: 70.9 % (ref 39.0–75.0)
Platelets: 170 10*3/uL (ref 140–400)
RBC: 3.39 10*6/uL — AB (ref 4.20–5.82)
RDW: 13.2 % (ref 11.0–14.6)
WBC: 4.5 10*3/uL (ref 4.0–10.3)

## 2015-10-02 DIAGNOSIS — M25452 Effusion, left hip: Secondary | ICD-10-CM | POA: Diagnosis not present

## 2015-10-02 DIAGNOSIS — M79652 Pain in left thigh: Secondary | ICD-10-CM | POA: Diagnosis not present

## 2015-10-02 DIAGNOSIS — M25552 Pain in left hip: Secondary | ICD-10-CM | POA: Diagnosis not present

## 2015-10-02 LAB — KAPPA/LAMBDA LIGHT CHAINS
IG KAPPA FREE LIGHT CHAIN: 19.5 mg/L — AB (ref 3.3–19.4)
Ig Lambda Free Light Chain: 16.8 mg/L (ref 5.7–26.3)
Kappa/Lambda FluidC Ratio: 1.16 (ref 0.26–1.65)

## 2015-10-06 LAB — MULTIPLE MYELOMA PANEL, SERUM
ALBUMIN SERPL ELPH-MCNC: 3.5 g/dL (ref 2.9–4.4)
ALPHA 1: 0.2 g/dL (ref 0.0–0.4)
Albumin/Glob SerPl: 1.4 (ref 0.7–1.7)
Alpha2 Glob SerPl Elph-Mcnc: 0.6 g/dL (ref 0.4–1.0)
B-Globulin SerPl Elph-Mcnc: 0.7 g/dL (ref 0.7–1.3)
GAMMA GLOB SERPL ELPH-MCNC: 1 g/dL (ref 0.4–1.8)
GLOBULIN, TOTAL: 2.6 g/dL (ref 2.2–3.9)
IgA, Qn, Serum: 97 mg/dL (ref 61–437)
IgM, Qn, Serum: 46 mg/dL (ref 15–143)
M Protein SerPl Elph-Mcnc: 0.6 g/dL — ABNORMAL HIGH
Total Protein: 6.1 g/dL (ref 6.0–8.5)

## 2015-10-08 ENCOUNTER — Ambulatory Visit (HOSPITAL_BASED_OUTPATIENT_CLINIC_OR_DEPARTMENT_OTHER): Payer: Medicare Other | Admitting: Oncology

## 2015-10-08 ENCOUNTER — Telehealth: Payer: Self-pay | Admitting: Oncology

## 2015-10-08 VITALS — BP 149/62 | HR 64 | Temp 97.7°F | Resp 18 | Wt 134.2 lb

## 2015-10-08 DIAGNOSIS — D472 Monoclonal gammopathy: Secondary | ICD-10-CM | POA: Diagnosis not present

## 2015-10-08 NOTE — Telephone Encounter (Signed)
per of to sch pt appt-gave pt copy of avs

## 2015-10-08 NOTE — Progress Notes (Signed)
[facility] OFFICE PROGRESS NOTE 10/08/15  Ramsey,Donald A, MD [address] [place]  [address]  DIAGNOSIS: 82-year-old gentleman with IgG kappa MGUS diagnosed in 2009. He has been on active surveillance since that time without any evidence of multiple myeloma. His M spike had been around 0.6 g/dL and have not changed.   CURRENT TREATMENT:  Observation.  INTERVAL HISTORY: [name] presents today for a follow-up visit. Since his last visit, he reports no changes in his health. He does report left-sided hip pain which has been chronic in nature related to degenerative arthritis. Despite that, he remains active and continues to attempt activities of daily living. He still drives and ambulate without the help of a walker or cane. He does use a cane at times for long distances. He has not reported any peripheral neuropathy, opportunistic infections, pathological fractures or constitutional symptoms.  He does not report any headaches, blurry vision, syncope or seizures. He does not report any fevers, chills, sweats or weight loss. He does not report any chest pain, palpitation, orthopnea or leg edema. He does not report any nausea, vomiting or abdominal pain. He does not report any frequency urgency or hesitancy. He does not report any skeletal complaints. Remaining review of systems unremarkable.  MEDICAL HISTORY: Past Medical History:  Diagnosis Date  . Anemia   . Arthritis   . Cataract      ALLERGIES:  has No Known Allergies.  MEDICATIONS:   Current Outpatient Prescriptions  Medication Sig Dispense Refill  . aspirin 81 MG tablet Take 81 mg by mouth daily.    . Calcium Citrate-Vitamin D (CALCIUM + D PO) Take 1,000 mg by mouth daily.    . chlorthalidone (HYGROTON) 25 MG tablet Take 12.5 mg by mouth daily.    . Cholecalciferol (VITAMIN D) 2000 UNITS tablet Take 2,500 Units by mouth daily.     . cyanocobalamin 2000 MCG tablet Take 2,500 mcg by mouth daily.    .  FINASTERIDE PO Take 5 mg by mouth daily.    . Fish Oil OIL 30 mLs by Does not apply route.    . NON FORMULARY Vision Vite - 1 tablet daily    . polyethylene glycol (MIRALAX / GLYCOLAX) packet Take 17 g by mouth daily.    . rosuvastatin (CRESTOR) 5 MG tablet Take 5 mg by mouth daily.    . Wheat Dextrin (BENEFIBER DRINK MIX PO) Take 30 mLs by mouth daily.     No current facility-administered medications for this visit.     SURGICAL HISTORY:  Past Surgical History:  Procedure Laterality Date  . cataracts       PHYSICAL EXAMINATION: ECOG PERFORMANCE STATUS: 0 - Asymptomatic  Blood pressure (!) 149/62, pulse 64, temperature 97.7 F (36.5 C), temperature source Oral, resp. rate 18, weight 134 lb 3.2 oz (60.9 kg), SpO2 99 %.   GENERAL:Well-appearing gentleman without distress. SKIN: No ecchymosis, petechiae or rash. EYES: normal, Conjunctiva are pink and non-injected, sclera clear OROPHARYNX:no exudate, no oral ulcers or lesions. NECK: supple, thyroid normal size, no lymphadenopathy. LYMPH:  no palpable lymphadenopathy in the cervical, axillary or supraclavicular LUNGS: clear to auscultation with normal breathing effort, no wheezes or rhonchi HEART: regular rate & rhythm and no murmur.  ABDOMEN:abdomen soft, non-tender and normal bowel sounds no shifting dullness or ascites. Musculoskeletal:no cyanosis of digits and no clubbing  NEURO: alert & oriented x 3 with fluent speech, no focal motor/sensory deficits  Labs:  Lab Results  Component Value Date     WBC 4.5 10/01/2015   HGB 11.1 (L) 10/01/2015   HCT 32.7 (L) 10/01/2015   MCV 96.5 10/01/2015   PLT 170 10/01/2015   NEUTROABS 3.2 10/01/2015      Chemistry      Component Value Date/Time   NA 142 10/01/2015 1054   K 4.1 10/01/2015 1054   CL 104 08/02/2012 0836   CO2 30 (H) 10/01/2015 1054   BUN 22.4 10/01/2015 1054   CREATININE 1.3 10/01/2015 1054      Component Value Date/Time   CALCIUM 10.2 10/01/2015 1054   ALKPHOS  57 10/01/2015 1054   AST 22 10/01/2015 1054   ALT 15 10/01/2015 1054   BILITOT 0.47 10/01/2015 1054       Results for [name], Donald E JR. (MRN [id number]) as of 10/08/2015 10:05  Ref. Range 08/02/2012 08:36 08/09/2013 08:12 09/24/2014 08:03 10/01/2015 10:54  IgG (Immunoglobin G), Serum Latest Ref Range: 700 - 1600 mg/dL 1,120 1,130 1,180 1,049   Results for [name], Donald E JR. (MRN [id number]) as of 10/08/2015 10:05  Ref. Range 10/01/2015 10:54  M Protein SerPl Elph-Mcnc Latest Ref Range: Not Observed g/dL 0.6 (H)    Assessment and plan:   82-year-old gentleman with the following issues:  1. IgG Kappa MGUS. This was diagnosed at least since 2009. He presented with normal IgG level and an M spike of 0.6 g/dL. Laboratory data obtained on 10/01/2015 were reviewed today and continues to show stable findings. His M spike have not changed and his IgG level remains normal. Rest of his laboratory testing appeared at baseline.  I recommended annual surveillance moving forward and restaging workup only he develops symptomatology or and organ damage.   2. Follow up: In 12 months for repeat laboratory testing and clinical examination.    SHADAD,FIRAS, MD 10/08/15 10:12 AM 

## 2015-10-09 DIAGNOSIS — H6121 Impacted cerumen, right ear: Secondary | ICD-10-CM | POA: Diagnosis not present

## 2015-11-07 DIAGNOSIS — N183 Chronic kidney disease, stage 3 (moderate): Secondary | ICD-10-CM | POA: Diagnosis not present

## 2015-11-07 DIAGNOSIS — M859 Disorder of bone density and structure, unspecified: Secondary | ICD-10-CM | POA: Diagnosis not present

## 2015-11-12 DIAGNOSIS — H6122 Impacted cerumen, left ear: Secondary | ICD-10-CM | POA: Diagnosis not present

## 2015-11-12 DIAGNOSIS — Z6821 Body mass index (BMI) 21.0-21.9, adult: Secondary | ICD-10-CM | POA: Diagnosis not present

## 2015-11-12 DIAGNOSIS — I10 Essential (primary) hypertension: Secondary | ICD-10-CM | POA: Diagnosis not present

## 2015-11-12 DIAGNOSIS — N182 Chronic kidney disease, stage 2 (mild): Secondary | ICD-10-CM | POA: Diagnosis not present

## 2015-11-12 DIAGNOSIS — R42 Dizziness and giddiness: Secondary | ICD-10-CM | POA: Diagnosis not present

## 2015-11-13 DIAGNOSIS — H6062 Unspecified chronic otitis externa, left ear: Secondary | ICD-10-CM | POA: Diagnosis not present

## 2015-11-13 DIAGNOSIS — H6122 Impacted cerumen, left ear: Secondary | ICD-10-CM | POA: Diagnosis not present

## 2015-11-13 DIAGNOSIS — H9041 Sensorineural hearing loss, unilateral, right ear, with unrestricted hearing on the contralateral side: Secondary | ICD-10-CM | POA: Diagnosis not present

## 2015-11-19 DIAGNOSIS — M25562 Pain in left knee: Secondary | ICD-10-CM | POA: Diagnosis not present

## 2015-11-24 DIAGNOSIS — H6062 Unspecified chronic otitis externa, left ear: Secondary | ICD-10-CM | POA: Diagnosis not present

## 2015-12-13 DIAGNOSIS — Z23 Encounter for immunization: Secondary | ICD-10-CM | POA: Diagnosis not present

## 2015-12-24 ENCOUNTER — Telehealth (INDEPENDENT_AMBULATORY_CARE_PROVIDER_SITE_OTHER): Payer: Self-pay | Admitting: Orthopedic Surgery

## 2015-12-24 NOTE — Telephone Encounter (Signed)
Patient wants to know if it is safe for him to take tylenol arthritis pain pill

## 2015-12-24 NOTE — Telephone Encounter (Signed)
I called pt and advised that he can take this med from our standpoint, and to call PCP to see if there are any contraindications from their standpoint.  He understands

## 2016-01-05 ENCOUNTER — Telehealth (INDEPENDENT_AMBULATORY_CARE_PROVIDER_SITE_OTHER): Payer: Self-pay | Admitting: Orthopedic Surgery

## 2016-01-05 NOTE — Telephone Encounter (Signed)
IC pt LMVM to call me back.

## 2016-01-05 NOTE — Telephone Encounter (Signed)
Pateint called, LMVM my phone, said his left knee is popping, and he is concerned.

## 2016-01-05 NOTE — Telephone Encounter (Signed)
I called s/w patient and he says the left knee is popping more, and gives way a little.  The cortisone injection on 11/19/2015 did not help him at all.  He is using OTC creams and taking tylenol for pain.  He asks about a brace that might help the knee and hip?  Would just a knee brace be of benefit to him?   What would you recommend for him?

## 2016-01-06 NOTE — Telephone Encounter (Signed)
He has bad oa - open patella knee sleeve ok pls clal thx

## 2016-01-07 NOTE — Telephone Encounter (Signed)
Dean patient needs a knee sleeve-  IC patient and advised he can get a knee sleeve.  He is coming by the office Thursday morning at 830 am to get a knee sleeve. Please have triage fit him for this.  Thanks-

## 2016-01-08 NOTE — Telephone Encounter (Signed)
Did you mean to send this to the clinic pool?

## 2016-01-08 NOTE — Telephone Encounter (Signed)
Patient came in today and was fitted with knee sleeve.  He will see how this helps him.

## 2016-01-12 ENCOUNTER — Encounter (INDEPENDENT_AMBULATORY_CARE_PROVIDER_SITE_OTHER): Payer: Self-pay | Admitting: Orthopedic Surgery

## 2016-01-20 ENCOUNTER — Telehealth (INDEPENDENT_AMBULATORY_CARE_PROVIDER_SITE_OTHER): Payer: Self-pay | Admitting: Orthopedic Surgery

## 2016-01-20 NOTE — Telephone Encounter (Signed)
Is this message complete?  Also it says her and this is a male. Just wanted to make sure. Thanks.

## 2016-02-11 ENCOUNTER — Telehealth (INDEPENDENT_AMBULATORY_CARE_PROVIDER_SITE_OTHER): Payer: Self-pay | Admitting: Radiology

## 2016-02-11 NOTE — Telephone Encounter (Signed)
Patient called and left voice mail on my line 02/10/16, saying he came and filled out a records release to [facility], says he did it the day after Thanksgiving (we were closed not sure if he is confused on the date)  He asks that someone call him to followup on this.  Thanks.

## 2016-02-12 DIAGNOSIS — H9042 Sensorineural hearing loss, unilateral, left ear, with unrestricted hearing on the contralateral side: Secondary | ICD-10-CM | POA: Diagnosis not present

## 2016-02-12 DIAGNOSIS — H6121 Impacted cerumen, right ear: Secondary | ICD-10-CM | POA: Diagnosis not present

## 2016-02-25 ENCOUNTER — Telehealth (INDEPENDENT_AMBULATORY_CARE_PROVIDER_SITE_OTHER): Payer: Self-pay | Admitting: Orthopedic Surgery

## 2016-02-25 NOTE — Telephone Encounter (Signed)
Pt asking if we need to fax information to [facility] regarding her left knee. She was referred. Pt requesting call back at [phone] or [phone]

## 2016-03-02 NOTE — Telephone Encounter (Signed)
I sent a message on this one earlier, can you tell me if notes have been sent to [facility]?  I will be glad to call him if you need me to, thanks.

## 2016-03-03 ENCOUNTER — Telehealth (INDEPENDENT_AMBULATORY_CARE_PROVIDER_SITE_OTHER): Payer: Self-pay | Admitting: Orthopedic Surgery

## 2016-03-03 NOTE — Telephone Encounter (Signed)
Pt still have trouble with left knee, asking if dr. Janann August should be aware of problem. Asking if he needs to fill out a release form? Requesting call at [phone] or [phone]

## 2016-03-04 NOTE — Telephone Encounter (Signed)
IC pt and advised him I have spoken with Tammy and she has sent his medical records to Dr Alusio's office. She will refax them today again.  He asks if what we sent includes the left knee, I advised him we send all medical records to them, so knee should be included

## 2016-04-15 DIAGNOSIS — M1612 Unilateral primary osteoarthritis, left hip: Secondary | ICD-10-CM | POA: Diagnosis not present

## 2016-04-16 ENCOUNTER — Ambulatory Visit: Payer: Self-pay | Admitting: Orthopedic Surgery

## 2016-04-27 ENCOUNTER — Other Ambulatory Visit (HOSPITAL_COMMUNITY): Payer: Self-pay | Admitting: Emergency Medicine

## 2016-04-27 NOTE — Patient Instructions (Addendum)
[name].  04/27/2016   Your procedure is scheduled on: 05-05-16  Report to [facility] Main  Entrance take [name]  elevators to 3rd floor to  [name] at 2:45PM.  Call this number if you have problems the morning of surgery [phone]   Remember: ONLY 1 PERSON MAY GO WITH YOU TO SHORT STAY TO GET  READY MORNING OF [name].  Do not eat food After Midnight. YOU MAY HAVE CLEAR LIQUIDS FROM MIDNIGHT UNTIL 1145AM DAY OF SURGERY. NOTHING BY MOUTH AFTER 1145AM!!     Take these medicines the morning of surgery with A SIP OF WATER: rosuvastatin(crestor), finasteride(proscar), tylenol as needed              You may not have any metal on your body including hair pins and              piercings  Do not wear jewelry, make-up, lotions, powders or perfumes, deodorant             Do not wear nail polish.  Do not shave  48 hours prior to surgery.              Men may shave face and neck.   Do not bring valuables to the [facility] [place].  Contacts, dentures or bridgework may not be worn into surgery.  Leave suitcase in the car. After surgery it may be brought to your room.              Please read over the following fact sheets you were given: _____________________________________________________________________      CLEAR LIQUID DIET   Foods Allowed                                                                     Foods Excluded  Coffee and tea, regular and decaf                             liquids that you cannot  Plain Jell-O in any flavor                                             see through such as: Fruit ices (not with fruit pulp)                                     milk, soups, orange juice  Iced Popsicles                                    All solid food Carbonated beverages, regular and diet  Cranberry, grape and apple juices Sports drinks like  Gatorade Lightly seasoned clear broth or consume(fat free) Sugar, honey syrup  Sample Menu Breakfast                                Lunch                                     Supper Cranberry juice                    Beef broth                            Chicken broth Jell-O                                     Grape juice                           Apple juice Coffee or tea                        Jell-O                                      Popsicle                                                Coffee or tea                        Coffee or tea  _____________________________________________________________________             [facility] - Preparing for Surgery Before surgery, you can play an important role.  Because skin is not sterile, your skin needs to be as free of germs as possible.  You can reduce the number of germs on your skin by washing with CHG (chlorahexidine gluconate) soap before surgery.  CHG is an antiseptic cleaner which kills germs and bonds with the skin to continue killing germs even after washing. Please DO NOT use if you have an allergy to CHG or antibacterial soaps.  If your skin becomes reddened/irritated stop using the CHG and inform your nurse when you arrive at Short Stay. Do not shave (including legs and underarms) for at least 48 hours prior to the first CHG shower.  You may shave your face/neck. Please follow these instructions carefully:  1.  Shower with CHG Soap the night before surgery and the  morning of Surgery.  2.  If you choose to wash your hair, wash your hair first as usual with your  normal  shampoo.  3.  After you shampoo, rinse your hair and body thoroughly to remove the  shampoo.                           4.  Use CHG as you would any other liquid soap.  You can apply chg  directly  to the skin and wash                       Gently with a scrungie or clean washcloth.  5.  Apply the CHG Soap to your body ONLY FROM THE NECK DOWN.   Do not use on face/  open                           Wound or open sores. Avoid contact with eyes, ears mouth and genitals (private parts).                       Wash face,  Genitals (private parts) with your normal soap.             6.  Wash thoroughly, paying special attention to the area where your surgery  will be performed.  7.  Thoroughly rinse your body with warm water from the neck down.  8.  DO NOT shower/wash with your normal soap after using and rinsing off  the CHG Soap.                9.  Pat yourself dry with a clean towel.            10.  Wear clean pajamas.            11.  Place clean sheets on your bed the night of your first shower and do not  sleep with pets. Day of Surgery : Do not apply any lotions/deodorants the morning of surgery.  Please wear clean clothes to the [facility]/surgery center.  FAILURE TO FOLLOW THESE INSTRUCTIONS MAY RESULT IN THE CANCELLATION OF YOUR SURGERY PATIENT SIGNATURE_________________________________  NURSE SIGNATURE__________________________________  ________________________________________________________________________    Adam Phenix  An incentive spirometer is a tool that can help keep your lungs clear and active. This tool measures how well you are filling your lungs with each breath. Taking long deep breaths may help reverse or decrease the chance of developing breathing (pulmonary) problems (especially infection) following:  A long period of time when you are unable to move or be active. BEFORE THE PROCEDURE   If the spirometer includes an indicator to show your best effort, your nurse or respiratory therapist will set it to a desired goal.  If possible, sit up straight or lean slightly forward. Try not to slouch.  Hold the incentive spirometer in an upright position. INSTRUCTIONS FOR USE  1. Sit on the edge of your bed if possible, or sit up as far as you can in bed or on a chair. 2. Hold the incentive spirometer in an upright  position. 3. Breathe out normally. 4. Place the mouthpiece in your mouth and seal your lips tightly around it. 5. Breathe in slowly and as deeply as possible, raising the piston or the ball toward the top of the column. 6. Hold your breath for 3-5 seconds or for as long as possible. Allow the piston or ball to fall to the bottom of the column. 7. Remove the mouthpiece from your mouth and breathe out normally. 8. Rest for a few seconds and repeat Steps 1 through 7 at least 10 times every 1-2 hours when you are awake. Take your time and take a few normal breaths between deep breaths. 9. The spirometer may include an indicator to show your best effort. Use the indicator as a goal to work toward during each  repetition. 10. After each set of 10 deep breaths, practice coughing to be sure your lungs are clear. If you have an incision (the cut made at the time of surgery), support your incision when coughing by placing a pillow or rolled up towels firmly against it. Once you are able to get out of bed, walk around indoors and cough well. You may stop using the incentive spirometer when instructed by your caregiver.  RISKS AND COMPLICATIONS  Take your time so you do not get dizzy or light-headed.  If you are in pain, you may need to take or ask for pain medication before doing incentive spirometry. It is harder to take a deep breath if you are having pain. AFTER USE  Rest and breathe slowly and easily.  It can be helpful to keep track of a log of your progress. Your caregiver can provide you with a simple table to help with this. If you are using the spirometer at home, follow these instructions: [name] IF:   You are having difficultly using the spirometer.  You have trouble using the spirometer as often as instructed.  Your pain medication is not giving enough relief while using the spirometer.  You develop fever of 100.5 F (38.1 C) or higher. SEEK IMMEDIATE MEDICAL CARE IF:    You cough up bloody sputum that had not been present before.  You develop fever of 102 F (38.9 C) or greater.  You develop worsening pain at or near the incision site. MAKE SURE YOU:   Understand these instructions.  Will watch your condition.  Will get help right away if you are not doing well or get worse. Document Released: 06/28/2006 Document Revised: 05/10/2011 Document Reviewed: 08/29/2006 ExitCare Patient Information 2014 ExitCare, [place].   ________________________________________________________________________  WHAT IS A BLOOD TRANSFUSION? Blood Transfusion Information  A transfusion is the replacement of blood or some of its parts. Blood is made up of multiple cells which provide different functions.  Red blood cells carry oxygen and are used for blood loss replacement.  White blood cells fight against infection.  Platelets control bleeding.  Plasma helps clot blood.  Other blood products are available for specialized needs, such as hemophilia or other clotting disorders. BEFORE THE TRANSFUSION  Who gives blood for transfusions?   Healthy volunteers who are fully evaluated to make sure their blood is safe. This is blood bank blood. Transfusion therapy is the safest it has ever been in the practice of medicine. Before blood is taken from a donor, a complete history is taken to make sure that person has no history of diseases nor engages in risky social behavior (examples are intravenous drug use or sexual activity with multiple partners). The donor's travel history is screened to minimize risk of transmitting infections, such as malaria. The donated blood is tested for signs of infectious diseases, such as HIV and hepatitis. The blood is then tested to be sure it is compatible with you in order to minimize the chance of a transfusion reaction. If you or a relative donates blood, this is often done in anticipation of surgery and is not appropriate for emergency  situations. It takes many days to process the donated blood. RISKS AND COMPLICATIONS Although transfusion therapy is very safe and saves many lives, the main dangers of transfusion include:   Getting an infectious disease.  Developing a transfusion reaction. This is an allergic reaction to something in the blood you were given. Every precaution is taken to prevent this.  The decision to have a blood transfusion has been considered carefully by your caregiver before blood is given. Blood is not given unless the benefits outweigh the risks. AFTER THE TRANSFUSION  Right after receiving a blood transfusion, you will usually feel much better and more energetic. This is especially true if your red blood cells have gotten low (anemic). The transfusion raises the level of the red blood cells which carry oxygen, and this usually causes an energy increase.  The nurse administering the transfusion will monitor you carefully for complications. HOME CARE INSTRUCTIONS  No special instructions are needed after a transfusion. You may find your energy is better. Speak with your caregiver about any limitations on activity for underlying diseases you may have. SEEK MEDICAL CARE IF:   Your condition is not improving after your transfusion.  You develop redness or irritation at the intravenous (IV) site. SEEK IMMEDIATE MEDICAL CARE IF:  Any of the following symptoms occur over the next 12 hours:  Shaking chills.  You have a temperature by mouth above 102 F (38.9 C), not controlled by medicine.  Chest, back, or muscle pain.  People around you feel you are not acting correctly or are confused.  Shortness of breath or difficulty breathing.  Dizziness and fainting.  You get a rash or develop hives.  You have a decrease in urine output.  Your urine turns a dark color or changes to pink, red, or brown. Any of the following symptoms occur over the next 10 days:  You have a temperature by mouth above  102 F (38.9 C), not controlled by medicine.  Shortness of breath.  Weakness after normal activity.  The white part of the eye turns yellow (jaundice).  You have a decrease in the amount of urine or are urinating less often.  Your urine turns a dark color or changes to pink, red, or brown. Document Released: 02/13/2000 Document Revised: 05/10/2011 Document Reviewed: 10/02/2007 [facility] [facility] Patient Information 2014 [place], [place].  _______________________________________________________________________

## 2016-04-27 NOTE — Progress Notes (Signed)
Medical clearance Dr Joylene Draft 04-19-16 chart Dorthula Perfect NP 11-12-15 on chart

## 2016-04-28 ENCOUNTER — Encounter (HOSPITAL_COMMUNITY)
Admission: RE | Admit: 2016-04-28 | Discharge: 2016-04-28 | Disposition: A | Discharge status: Home / Self Care | Payer: Medicare Other | Source: Ambulatory Visit | Attending: Orthopedic Surgery | Admitting: Orthopedic Surgery

## 2016-04-28 ENCOUNTER — Encounter (HOSPITAL_COMMUNITY): Payer: Self-pay

## 2016-04-28 DIAGNOSIS — M1612 Unilateral primary osteoarthritis, left hip: Secondary | ICD-10-CM | POA: Diagnosis not present

## 2016-04-28 DIAGNOSIS — Z0183 Encounter for blood typing: Secondary | ICD-10-CM | POA: Insufficient documentation

## 2016-04-28 DIAGNOSIS — Z01812 Encounter for preprocedural laboratory examination: Secondary | ICD-10-CM | POA: Insufficient documentation

## 2016-04-28 DIAGNOSIS — H6121 Impacted cerumen, right ear: Secondary | ICD-10-CM | POA: Diagnosis not present

## 2016-04-28 DIAGNOSIS — Z01818 Encounter for other preprocedural examination: Secondary | ICD-10-CM | POA: Insufficient documentation

## 2016-04-28 DIAGNOSIS — I10 Essential (primary) hypertension: Secondary | ICD-10-CM | POA: Diagnosis not present

## 2016-04-28 HISTORY — DX: Dysphagia, unspecified: R13.10

## 2016-04-28 HISTORY — DX: Gout, unspecified: M10.9

## 2016-04-28 HISTORY — DX: Fracture of unspecified carpal bone, unspecified wrist, initial encounter for closed fracture: S62.109A

## 2016-04-28 HISTORY — DX: Other disorders following mastoidectomy, bilateral ears: H95.193

## 2016-04-28 LAB — SURGICAL PCR SCREEN
MRSA, PCR: NEGATIVE
Staphylococcus aureus: NEGATIVE

## 2016-04-28 LAB — CBC
HCT: 35.3 % — ABNORMAL LOW (ref 39.0–52.0)
Hemoglobin: 11.9 g/dL — ABNORMAL LOW (ref 13.0–17.0)
MCH: 32.1 pg (ref 26.0–34.0)
MCHC: 33.7 g/dL (ref 30.0–36.0)
MCV: 95.1 fL (ref 78.0–100.0)
PLATELETS: 242 10*3/uL (ref 150–400)
RBC: 3.71 MIL/uL — AB (ref 4.22–5.81)
RDW: 13 % (ref 11.5–15.5)
WBC: 5.2 10*3/uL (ref 4.0–10.5)

## 2016-04-28 LAB — COMPREHENSIVE METABOLIC PANEL
ALT: 19 U/L (ref 17–63)
ANION GAP: 7 (ref 5–15)
AST: 27 U/L (ref 15–41)
Albumin: 4 g/dL (ref 3.5–5.0)
Alkaline Phosphatase: 58 U/L (ref 38–126)
BUN: 21 mg/dL — ABNORMAL HIGH (ref 6–20)
CO2: 31 mmol/L (ref 22–32)
Calcium: 10 mg/dL (ref 8.9–10.3)
Chloride: 102 mmol/L (ref 101–111)
Creatinine, Ser: 1.13 mg/dL (ref 0.61–1.24)
GFR calc non Af Amer: 56 mL/min — ABNORMAL LOW (ref >60)
Glucose, Bld: 105 mg/dL — ABNORMAL HIGH (ref 65–99)
Potassium: 4.1 mmol/L (ref 3.5–5.1)
SODIUM: 140 mmol/L (ref 135–145)
Total Bilirubin: 0.5 mg/dL (ref 0.3–1.2)
Total Protein: 7.1 g/dL (ref 6.5–8.1)

## 2016-04-28 LAB — PROTIME-INR
INR: 0.99
PROTHROMBIN TIME: 13.1 s (ref 11.4–15.2)

## 2016-04-28 LAB — APTT: aPTT: 28 seconds (ref 24–36)

## 2016-04-28 LAB — ABO/RH: ABO/RH(D): A POS

## 2016-05-04 ENCOUNTER — Ambulatory Visit: Payer: Self-pay | Admitting: Orthopedic Surgery

## 2016-05-04 NOTE — H&P (Signed)
[name] DOB: 1925-01-16 Widowed / Language: Cleophus Molt / Race: White Male Date of Admission:  05/05/2016 CC:  left Hip Pain History of Present Illness The patient is a 83 year old male who comes in for a preoperative History and Physical. The patient is scheduled for a left total hip arthroplasty (anterior) to be performed by Dr. Dione Plover. Aluisio, MD at [facility] on 05-05-2016. The patient is a 83 year old male who presented with a left hip problem. The patient was seen for a second opinion.The patient reports left hip problems including pain and giving way symptoms that have been present for 1 year(s). The symptoms began without any known injury. Symptoms reported include hip pain and pain with weightbearing The patient reports symptoms radiating to the: left groin, left thigh and left knee. The patient describes the hip problem as aching (soreness). The patient feels as if their symptoms are does feel they are worsening. Symptoms are exacerbated by movement, flexing hip, squatting, weight bearing and walking. Symptoms are relieved by rest. Current treatment includes use of a walker (cane) and nonsteroidal anti-inflammatory drugs (Tylenol). Previous workup for this problem has included hip MRI. Previous treatment for this problem has included nonsteroidal anti-inflammatory drugs (IA injection s/p 11 months ago, helped for 2 days.). Note for "Hip problem": HX left knee OA, 11/19/15: cortisone injection: did not help. He has had problems with the hip for about a year now. He had some pain even before that, that has gotten progressively worse. 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 The pain became too difficult for him to tolerate, so he stopped playing tennis. He still walks, but is doing far less activity now than he did a year ago and the main culprit for this is his left hip. The pain can be intense at times. Occasionally, he will even have pain at  rest. Activity is far worse than rest. He is losing more mobility. He is not currently having any back pain, lower extremity weakness, or paresthesia with it. Right hip hurts, but to a much lesser degree. AP pelvis and lateral of the left hip which were taken today to compare to x-rays from a year ago showed that he has horrible bone-on-bone arthritis with femoral head erosion and a small amount of superior acetabular erosion. He has advanced erosive end-stage degenerative changes of the left hip. They are getting progressively worse and now he is even losing bone stock. At this point, the most predictable means of improving pain and function is total hip arthroplasty. The procedure, risks, potential complications and rehab course are discussed in detail and the patient elects to proceed.  They have been treated conservatively in the past for the above stated problem and despite conservative measures, they continue to have progressive pain and severe functional limitations and dysfunction. They have failed non-operative management including home exercise, medications, and activity modification. At this point that they would benefit from undergoing total joint replacement. Risks and benefits of the procedure have been discussed with the patient and they elect to proceed with surgery. There are no active contraindications to surgery such as ongoing infection or rapidly progressive neurological disease.   Problem List/Past Medical Primary osteoarthritis of left hip (M16.12)  Tenosynovitis, foot/ankle (727.06) [08/25/1999]: Asthma  Gout  Prostate Disease  Enlarged Prostate Ulcer disease  Impaired Vision  Impaired Hearing  Macular Degeneration  Cataract   Allergies  No Known Drug  Allergies  Family History  Cancer  Sister. Cerebrovascular Accident  Father, Mother. Congestive Heart Failure  Sister. First Degree Relatives  reported Heart disease in male family member before age 18    Social History Current work status  retired Furniture conservator/restorer daily; does running / walking and gym / [employer] Living situation  live alone Marital status  widowed Never consumed alcohol  04/15/2016: Never consumed alcohol No history of drug/alcohol rehab  Not under pain contract  Number of flights of stairs before winded  4-5 Tobacco / smoke exposure  04/15/2016: no Tobacco use  Never smoker. 04/15/2016 Advance Directives  Living Will, Healthcare POA  Medication History Tylenol ('325MG'$  Tablet, 1 (one) Oral) Active. MiraLax (Oral) Active. Benefiber On The GO (Oral) Active. Fish Oil ('1000MG'$  Capsule, Oral) Active. Vision Vitamins (Oral) Active. Calcium (1200-'1000MG'$ -UNIT Tablet Chewable, Oral) Active. Vitamin D (2000UNIT Capsule, Oral) Active. Vitamin B12 (1000MCG Tablet ER, Oral) Active. Aspirin ('81MG'$  Tablet DR, 1 (one) Oral) Active. Chlorthalidone ('15MG'$  Tablet, Oral) Active. Finasteride ('5MG'$  Tablet, Oral) Active. Rosuvastatin Calcium ('5MG'$  Tablet, Oral) Active.  Past Surgical History Cataract Surgery  bilateral Mastoidectomy  bilateral   Review of Systems General Not Present- Chills, Fatigue, Fever, Memory Loss, Night Sweats, Weight Gain and Weight Loss. Skin Not Present- Eczema, Hives, Itching, Lesions and Rash. HEENT Present- Hearing Loss. Not Present- Dentures, Double Vision, Headache, Tinnitus and Visual Loss. Respiratory Present- Cough. Not Present- Allergies, Chronic Cough, Coughing up blood, Shortness of breath at rest and Shortness of breath with exertion. Cardiovascular Not Present- Chest Pain, Difficulty Breathing Lying Down, Murmur, Palpitations, Racing/skipping heartbeats and Swelling. Gastrointestinal Present- Constipation. Not Present- Abdominal Pain, Bloody Stool, Diarrhea, Difficulty Swallowing, Heartburn, Jaundice, Loss of appetitie, Nausea and Vomiting. Male Genitourinary Present- Urinary frequency and Urinating at Night. Not  Present- Blood in Urine, Discharge, Flank Pain, Incontinence, Painful Urination, Urgency, Urinary Retention and Weak urinary stream. Musculoskeletal Present- Joint Pain. Not Present- Back Pain, Joint Swelling, Morning Stiffness, Muscle Pain, Muscle Weakness and Spasms. Neurological Not Present- Blackout spells, Difficulty with balance, Dizziness, Paralysis, Tremor and Weakness. Psychiatric Not Present- Insomnia.  Vitals  Weight: 130 lb Height: 67in Weight was reported by patient. Height was reported by patient. Body Surface Area: 1.68 m Body Mass Index: 20.36 kg/m  Pulse: 68 (Regular)  BP: 148/68 (Sitting, Right Arm, Standard)   Physical Exam General Mental Status -Alert, cooperative and good historian. General Appearance-pleasant, Not in acute distress. Orientation-Oriented X3. Build & Nutrition-Well nourished and Well developed.  Head and Neck Head-normocephalic, atraumatic . Neck Global Assessment - bruit auscultated on the left and supple, no bruit auscultated on the right. Carotid Arteries - Left - bruit(slight present on left caroitd area).  Eye Vision-Wears corrective lenses. Pupil - Bilateral-Regular and Round. Motion - Bilateral-EOMI.  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 Adventitious sounds - No Adventitious sounds.  Cardiovascular Auscultation Rhythm - Regular rate and rhythm. Heart Sounds - S1 WNL and S2 WNL. Murmurs & Other Heart Sounds - Auscultation of the heart reveals - No Murmurs.  Abdomen Palpation/Percussion Tenderness - Abdomen is non-tender to palpation. Rigidity (guarding) - Abdomen is soft. Auscultation Auscultation of the abdomen reveals - Bowel sounds normal.  Male Genitourinary Note: Not done, not pertinent to present illness   Musculoskeletal Note:  Well-developed male, appears much younger than his stated age. He is alert and oriented, in no  apparent distress. Right hip flex 110, rotate in 20 out 30, abduct 30 without discomfort. Left hip flexion 90, no internal or external rotation, only about 10 to 20 degrees of abduction. He is short about half inch on the left compared to the right. He has a significantly antalgic gait pattern. Pulses, sensation, and motor are intact distally.  RADIOGRAPHS AP pelvis and lateral of the left hip which were taken today to compare to x-rays from a year ago showed that he has horrible bone-on-bone arthritis with femoral head erosion and a small amount of superior acetabular erosion. It was nowhere near this bad a year ago.  Assessment & Plan  Primary osteoarthritis of left hip (M16.12)  Note:Surgical Plans: Left Total Hip Replacement - Anterior Approach  Disposition: Home  PCP: Dr. Crist Infante - Patient has been seen preoperatively and felt to be stable for surgery.  IV TXA  Anesthesia Issues: None  Patient was instructed on what medications to stop prior to surgery.  Please talk to patient when waking up into the right ear due to complete hearing loss in the left ear.  Signed electronically by Ok Edwards, III PA-C

## 2016-05-05 ENCOUNTER — Inpatient Hospital Stay (HOSPITAL_COMMUNITY): Payer: Medicare Other

## 2016-05-05 ENCOUNTER — Encounter (HOSPITAL_COMMUNITY): Payer: Self-pay

## 2016-05-05 ENCOUNTER — Inpatient Hospital Stay (HOSPITAL_COMMUNITY)
Admission: RE | Admit: 2016-05-05 | Discharge: 2016-05-07 | DRG: 470 | Disposition: A | Discharge status: Home / Self Care | Payer: Medicare Other | Source: Ambulatory Visit | Attending: Orthopedic Surgery | Admitting: Orthopedic Surgery

## 2016-05-05 ENCOUNTER — Inpatient Hospital Stay (HOSPITAL_COMMUNITY): Payer: Medicare Other | Admitting: Anesthesiology

## 2016-05-05 ENCOUNTER — Encounter (HOSPITAL_COMMUNITY)
Admission: RE | Disposition: A | Discharge status: Home / Self Care | Payer: Self-pay | Source: Ambulatory Visit | Attending: Orthopedic Surgery

## 2016-05-05 DIAGNOSIS — J45909 Unspecified asthma, uncomplicated: Secondary | ICD-10-CM | POA: Diagnosis present

## 2016-05-05 DIAGNOSIS — Z79899 Other long term (current) drug therapy: Secondary | ICD-10-CM | POA: Diagnosis not present

## 2016-05-05 DIAGNOSIS — H3530 Unspecified macular degeneration: Secondary | ICD-10-CM | POA: Diagnosis present

## 2016-05-05 DIAGNOSIS — K5900 Constipation, unspecified: Secondary | ICD-10-CM | POA: Diagnosis not present

## 2016-05-05 DIAGNOSIS — Z96642 Presence of left artificial hip joint: Secondary | ICD-10-CM | POA: Diagnosis not present

## 2016-05-05 DIAGNOSIS — M1612 Unilateral primary osteoarthritis, left hip: Principal | ICD-10-CM | POA: Diagnosis present

## 2016-05-05 DIAGNOSIS — M169 Osteoarthritis of hip, unspecified: Secondary | ICD-10-CM | POA: Diagnosis present

## 2016-05-05 DIAGNOSIS — H9192 Unspecified hearing loss, left ear: Secondary | ICD-10-CM | POA: Diagnosis present

## 2016-05-05 DIAGNOSIS — Z7982 Long term (current) use of aspirin: Secondary | ICD-10-CM | POA: Diagnosis not present

## 2016-05-05 DIAGNOSIS — R1310 Dysphagia, unspecified: Secondary | ICD-10-CM | POA: Diagnosis present

## 2016-05-05 DIAGNOSIS — M109 Gout, unspecified: Secondary | ICD-10-CM | POA: Diagnosis present

## 2016-05-05 DIAGNOSIS — Z471 Aftercare following joint replacement surgery: Secondary | ICD-10-CM | POA: Diagnosis not present

## 2016-05-05 DIAGNOSIS — I10 Essential (primary) hypertension: Secondary | ICD-10-CM | POA: Diagnosis present

## 2016-05-05 DIAGNOSIS — D649 Anemia, unspecified: Secondary | ICD-10-CM | POA: Diagnosis not present

## 2016-05-05 DIAGNOSIS — Z96649 Presence of unspecified artificial hip joint: Secondary | ICD-10-CM

## 2016-05-05 HISTORY — PX: TOTAL HIP ARTHROPLASTY: SHX124

## 2016-05-05 SURGERY — ARTHROPLASTY, HIP, TOTAL, ANTERIOR APPROACH
Anesthesia: Monitor Anesthesia Care | Site: Hip | Laterality: Left

## 2016-05-05 MED ORDER — ACETAMINOPHEN 500 MG PO TABS
1000.0000 mg | ORAL_TABLET | Freq: Four times a day (QID) | ORAL | Status: AC
  Administered 2016-05-06 (×4): 1000 mg via ORAL
  Filled 2016-05-05 (×4): qty 2

## 2016-05-05 MED ORDER — METHOCARBAMOL 1000 MG/10ML IJ SOLN
500.0000 mg | Freq: Four times a day (QID) | INTRAVENOUS | Status: DC | PRN
  Administered 2016-05-05: 500 mg via INTRAVENOUS
  Filled 2016-05-05: qty 5
  Filled 2016-05-05: qty 550

## 2016-05-05 MED ORDER — LIDOCAINE 2% (20 MG/ML) 5 ML SYRINGE
INTRAMUSCULAR | Status: DC | PRN
  Administered 2016-05-05: 40 mg via INTRAVENOUS

## 2016-05-05 MED ORDER — POLYETHYLENE GLYCOL 3350 17 G PO PACK
17.0000 g | PACK | Freq: Every day | ORAL | Status: DC | PRN
  Administered 2016-05-06: 21:00:00 17 g via ORAL
  Filled 2016-05-05: qty 1

## 2016-05-05 MED ORDER — ONDANSETRON HCL 4 MG/2ML IJ SOLN: 4.0000 mg | Freq: Once | INTRAMUSCULAR | Status: DC | PRN

## 2016-05-05 MED ORDER — METOCLOPRAMIDE HCL 5 MG PO TABS
5.0000 mg | ORAL_TABLET | Freq: Three times a day (TID) | ORAL | Status: DC | PRN
Start: 2016-05-05 — End: 2016-05-07

## 2016-05-05 MED ORDER — BUPIVACAINE IN DEXTROSE 0.75-8.25 % IT SOLN
INTRATHECAL | Status: DC | PRN
  Administered 2016-05-05: 2 mL via INTRATHECAL

## 2016-05-05 MED ORDER — RIVAROXABAN 10 MG PO TABS
10.0000 mg | ORAL_TABLET | Freq: Every day | ORAL | Status: DC
  Administered 2016-05-06 – 2016-05-07 (×2): 10 mg via ORAL
  Filled 2016-05-05 (×2): qty 1

## 2016-05-05 MED ORDER — FLEET ENEMA 7-19 GM/118ML RE ENEM: 1.0000 | ENEMA | Freq: Once | RECTAL | Status: DC | PRN

## 2016-05-05 MED ORDER — ROSUVASTATIN CALCIUM 5 MG PO TABS
5.0000 mg | ORAL_TABLET | Freq: Every day | ORAL | Status: DC
  Administered 2016-05-06 – 2016-05-07 (×2): 5 mg via ORAL
  Filled 2016-05-05 (×2): qty 1

## 2016-05-05 MED ORDER — PROPOFOL 10 MG/ML IV BOLUS
INTRAVENOUS | Status: AC
  Filled 2016-05-05: qty 20

## 2016-05-05 MED ORDER — DEXAMETHASONE SODIUM PHOSPHATE 10 MG/ML IJ SOLN
10.0000 mg | Freq: Once | INTRAMUSCULAR | Status: AC
Start: 2016-05-06 — End: 2016-05-06
  Administered 2016-05-06: 09:00:00 10 mg via INTRAVENOUS
  Filled 2016-05-05: qty 1

## 2016-05-05 MED ORDER — BUPIVACAINE HCL (PF) 0.25 % IJ SOLN
INTRAMUSCULAR | Status: DC | PRN
  Administered 2016-05-05: 30 mL

## 2016-05-05 MED ORDER — FENTANYL CITRATE (PF) 100 MCG/2ML IJ SOLN
INTRAMUSCULAR | Status: DC | PRN
  Administered 2016-05-05 (×2): 50 ug via INTRAVENOUS

## 2016-05-05 MED ORDER — FENTANYL CITRATE (PF) 100 MCG/2ML IJ SOLN: 25.0000 ug | INTRAMUSCULAR | Status: DC | PRN

## 2016-05-05 MED ORDER — PHENOL 1.4 % MT LIQD
1.0000 | OROMUCOSAL | Status: DC | PRN
  Filled 2016-05-05: qty 177

## 2016-05-05 MED ORDER — TRAMADOL HCL 50 MG PO TABS: 50.0000 mg | ORAL_TABLET | Freq: Four times a day (QID) | ORAL | Status: DC | PRN

## 2016-05-05 MED ORDER — ALBUMIN HUMAN 25 % IV SOLN
INTRAVENOUS | Status: DC | PRN
  Administered 2016-05-05: 19:00:00 via INTRAVENOUS

## 2016-05-05 MED ORDER — SODIUM CHLORIDE 0.9 % IV SOLN
INTRAVENOUS | Status: DC
  Administered 2016-05-05: 22:00:00 via INTRAVENOUS

## 2016-05-05 MED ORDER — ONDANSETRON HCL 4 MG/2ML IJ SOLN: 4.0000 mg | Freq: Four times a day (QID) | INTRAMUSCULAR | Status: DC | PRN

## 2016-05-05 MED ORDER — DIPHENHYDRAMINE HCL 12.5 MG/5ML PO ELIX: 12.5000 mg | ORAL_SOLUTION | ORAL | Status: DC | PRN

## 2016-05-05 MED ORDER — LACTATED RINGERS IV SOLN
INTRAVENOUS | Status: DC
  Administered 2016-05-05: 18:00:00 via INTRAVENOUS
  Administered 2016-05-05: 1000 mL via INTRAVENOUS

## 2016-05-05 MED ORDER — ACETAMINOPHEN 325 MG PO TABS
650.0000 mg | ORAL_TABLET | Freq: Four times a day (QID) | ORAL | Status: DC | PRN
  Administered 2016-05-07: 650 mg via ORAL
  Filled 2016-05-05: qty 2

## 2016-05-05 MED ORDER — BISACODYL 10 MG RE SUPP: 10.0000 mg | Freq: Every day | RECTAL | Status: DC | PRN

## 2016-05-05 MED ORDER — OXYCODONE HCL 5 MG PO TABS: 5.0000 mg | ORAL_TABLET | ORAL | Status: DC | PRN

## 2016-05-05 MED ORDER — ONDANSETRON HCL 4 MG/2ML IJ SOLN
INTRAMUSCULAR | Status: DC | PRN
  Administered 2016-05-05: 4 mg via INTRAVENOUS

## 2016-05-05 MED ORDER — DEXAMETHASONE SODIUM PHOSPHATE 10 MG/ML IJ SOLN
10.0000 mg | Freq: Once | INTRAMUSCULAR | Status: AC
  Administered 2016-05-05: 10 mg via INTRAVENOUS

## 2016-05-05 MED ORDER — ACETAMINOPHEN 650 MG RE SUPP: 650.0000 mg | Freq: Four times a day (QID) | RECTAL | Status: DC | PRN

## 2016-05-05 MED ORDER — METOCLOPRAMIDE HCL 5 MG/ML IJ SOLN: 5.0000 mg | Freq: Three times a day (TID) | INTRAMUSCULAR | Status: DC | PRN

## 2016-05-05 MED ORDER — MIDAZOLAM HCL 2 MG/2ML IJ SOLN
INTRAMUSCULAR | Status: AC
  Filled 2016-05-05: qty 2

## 2016-05-05 MED ORDER — CHLORTHALIDONE 25 MG PO TABS
12.5000 mg | ORAL_TABLET | Freq: Every day | ORAL | Status: DC
  Administered 2016-05-06 – 2016-05-07 (×2): 12.5 mg via ORAL
  Filled 2016-05-05 (×2): qty 1

## 2016-05-05 MED ORDER — ACETAMINOPHEN 10 MG/ML IV SOLN
1000.0000 mg | Freq: Once | INTRAVENOUS | Status: AC
  Administered 2016-05-05: 1000 mg via INTRAVENOUS
  Filled 2016-05-05: qty 100

## 2016-05-05 MED ORDER — EPHEDRINE SULFATE 50 MG/ML IJ SOLN
INTRAMUSCULAR | Status: DC | PRN
  Administered 2016-05-05 (×2): 10 mg via INTRAVENOUS

## 2016-05-05 MED ORDER — FENTANYL CITRATE (PF) 100 MCG/2ML IJ SOLN
INTRAMUSCULAR | Status: AC
  Filled 2016-05-05: qty 2

## 2016-05-05 MED ORDER — CHLORHEXIDINE GLUCONATE 4 % EX LIQD: 60.0000 mL | Freq: Once | CUTANEOUS | Status: DC

## 2016-05-05 MED ORDER — PROPOFOL 10 MG/ML IV BOLUS
INTRAVENOUS | Status: DC | PRN
  Administered 2016-05-05 (×3): 10 mg via INTRAVENOUS

## 2016-05-05 MED ORDER — FINASTERIDE 5 MG PO TABS
5.0000 mg | ORAL_TABLET | Freq: Every day | ORAL | Status: DC
  Administered 2016-05-06 – 2016-05-07 (×2): 5 mg via ORAL
  Filled 2016-05-05 (×2): qty 1

## 2016-05-05 MED ORDER — TRANEXAMIC ACID 1000 MG/10ML IV SOLN
1000.0000 mg | INTRAVENOUS | Status: AC
  Administered 2016-05-05: 1000 mg via INTRAVENOUS
  Filled 2016-05-05: qty 1100

## 2016-05-05 MED ORDER — METHOCARBAMOL 500 MG PO TABS: 500.0000 mg | ORAL_TABLET | Freq: Four times a day (QID) | ORAL | Status: DC | PRN

## 2016-05-05 MED ORDER — CEFAZOLIN SODIUM-DEXTROSE 2-4 GM/100ML-% IV SOLN
2.0000 g | Freq: Four times a day (QID) | INTRAVENOUS | Status: AC
  Administered 2016-05-06 (×2): 2 g via INTRAVENOUS
  Filled 2016-05-05 (×2): qty 100

## 2016-05-05 MED ORDER — ONDANSETRON HCL 4 MG PO TABS: 4.0000 mg | ORAL_TABLET | Freq: Four times a day (QID) | ORAL | Status: DC | PRN

## 2016-05-05 MED ORDER — PROPOFOL 500 MG/50ML IV EMUL
INTRAVENOUS | Status: DC | PRN
  Administered 2016-05-05: 50 ug/kg/min via INTRAVENOUS

## 2016-05-05 MED ORDER — CEFAZOLIN SODIUM-DEXTROSE 2-4 GM/100ML-% IV SOLN
2.0000 g | INTRAVENOUS | Status: AC
  Administered 2016-05-05: 2 g via INTRAVENOUS
  Filled 2016-05-05: qty 100

## 2016-05-05 MED ORDER — MENTHOL 3 MG MT LOZG: 1.0000 | LOZENGE | OROMUCOSAL | Status: DC | PRN

## 2016-05-05 MED ORDER — EPHEDRINE 5 MG/ML INJ
INTRAVENOUS | Status: AC
  Filled 2016-05-05: qty 10

## 2016-05-05 MED ORDER — BUPIVACAINE HCL (PF) 0.25 % IJ SOLN
INTRAMUSCULAR | Status: AC
  Filled 2016-05-05: qty 30

## 2016-05-05 MED ORDER — DOCUSATE SODIUM 100 MG PO CAPS
100.0000 mg | ORAL_CAPSULE | Freq: Two times a day (BID) | ORAL | Status: DC
  Administered 2016-05-05 – 2016-05-07 (×3): 100 mg via ORAL
  Filled 2016-05-05 (×3): qty 1

## 2016-05-05 SURGICAL SUPPLY — 36 items
BAG DECANTER FOR FLEXI CONT (MISCELLANEOUS) ×3 IMPLANT
BAG SPEC THK2 15X12 ZIP CLS (MISCELLANEOUS)
BAG ZIPLOCK 12X15 (MISCELLANEOUS) IMPLANT
BLADE SAG 18X100X1.27 (BLADE) ×3 IMPLANT
CAPT HIP TOTAL 2 ×2 IMPLANT
CLOSURE WOUND 1/2 X4 (GAUZE/BANDAGES/DRESSINGS) ×1
CLOTH BEACON ORANGE TIMEOUT ST (SAFETY) ×3 IMPLANT
COVER PERINEAL POST (MISCELLANEOUS) ×3 IMPLANT
DECANTER SPIKE VIAL GLASS SM (MISCELLANEOUS) ×3 IMPLANT
DRAPE STERI IOBAN 125X83 (DRAPES) ×3 IMPLANT
DRAPE U-SHAPE 47X51 STRL (DRAPES) ×6 IMPLANT
DRSG ADAPTIC 3X8 NADH LF (GAUZE/BANDAGES/DRESSINGS) ×3 IMPLANT
DRSG MEPILEX BORDER 4X4 (GAUZE/BANDAGES/DRESSINGS) ×3 IMPLANT
DRSG MEPILEX BORDER 4X8 (GAUZE/BANDAGES/DRESSINGS) ×3 IMPLANT
DURAPREP 26ML APPLICATOR (WOUND CARE) ×3 IMPLANT
ELECT REM PT RETURN 9FT ADLT (ELECTROSURGICAL) ×3
ELECTRODE REM PT RTRN 9FT ADLT (ELECTROSURGICAL) ×1 IMPLANT
EVACUATOR 1/8 PVC DRAIN (DRAIN) ×3 IMPLANT
GLOVE BIO SURGEON STRL SZ7.5 (GLOVE) ×3 IMPLANT
GLOVE BIO SURGEON STRL SZ8 (GLOVE) ×6 IMPLANT
GLOVE BIOGEL PI IND STRL 8 (GLOVE) ×2 IMPLANT
GLOVE BIOGEL PI INDICATOR 8 (GLOVE) ×4
GOWN STRL REUS W/TWL LRG LVL3 (GOWN DISPOSABLE) ×3 IMPLANT
GOWN STRL REUS W/TWL XL LVL3 (GOWN DISPOSABLE) ×3 IMPLANT
NS IRRIG 1000ML POUR BTL (IV SOLUTION) ×2 IMPLANT
PACK ANTERIOR HIP CUSTOM (KITS) ×3 IMPLANT
STRIP CLOSURE SKIN 1/2X4 (GAUZE/BANDAGES/DRESSINGS) ×2 IMPLANT
SUT ETHIBOND NAB CT1 #1 30IN (SUTURE) ×3 IMPLANT
SUT MNCRL AB 4-0 PS2 18 (SUTURE) ×3 IMPLANT
SUT VIC AB 2-0 CT1 27 (SUTURE) ×6
SUT VIC AB 2-0 CT1 TAPERPNT 27 (SUTURE) ×2 IMPLANT
SUT VLOC 180 0 24IN GS25 (SUTURE) ×3 IMPLANT
SYR 50ML LL SCALE MARK (SYRINGE) IMPLANT
TRAY FOLEY W/METER SILVER 16FR (SET/KITS/TRAYS/PACK) ×3 IMPLANT
WATER STERILE IRR 1000ML POUR (IV SOLUTION) ×4 IMPLANT
YANKAUER SUCT BULB TIP 10FT TU (MISCELLANEOUS) ×3 IMPLANT

## 2016-05-05 NOTE — Transfer of Care (Signed)
Immediate Anesthesia Transfer of Care Note  Patient: [name].  Procedure(s) Performed: Procedure(s): LEFT TOTAL HIP ARTHROPLASTY ANTERIOR APPROACH (Left)  Patient Location: PACU  Anesthesia Type:MAC and Spinal  Level of Consciousness:  sedated, patient cooperative and responds to stimulation  Airway & Oxygen Therapy:Patient Spontanous Breathing and Patient connected to face mask oxgen  Post-op Assessment:  Report given to PACU RN and Post -op Vital signs reviewed and stable  Post vital signs:  Reviewed and stable  Last Vitals:  Vitals:   05/05/16 1437  BP: (!) 188/91  Pulse: 67  Resp: 18  Temp: 31.4 C    Complications: No apparent anesthesia complications

## 2016-05-05 NOTE — Anesthesia Preprocedure Evaluation (Addendum)
Anesthesia Evaluation  Patient identified by MRN, date of birth, ID band Patient awake    Reviewed: Allergy & Precautions, NPO status , Patient's Chart, lab work & pertinent test results  Airway Mallampati: III  TM Distance: >3 FB Neck ROM: Full    Dental  (+) Dental Advisory Given, Edentulous Upper, Edentulous Lower   Pulmonary neg pulmonary ROS,    Pulmonary exam normal breath sounds clear to auscultation       Cardiovascular hypertension, Pt. on medications (-) angina(-) CAD, (-) Past MI and (-) CHF Normal cardiovascular exam Rhythm:Regular Rate:Normal     Neuro/Psych negative neurological ROS     GI/Hepatic negative GI ROS, Neg liver ROS,   Endo/Other  negative endocrine ROS  Renal/GU negative Renal ROS     Musculoskeletal  (+) Arthritis , Osteoarthritis,    Abdominal   Peds  Hematology  (+) Blood dyscrasia, anemia , Plt 242k   Anesthesia Other Findings Day of surgery medications reviewed with the patient.  Reproductive/Obstetrics                            Anesthesia Physical Anesthesia Plan  ASA: III  Anesthesia Plan: Spinal and MAC   Post-op Pain Management:    Induction: Intravenous  Airway Management Planned: Simple Face Mask  Additional Equipment:   Intra-op Plan:   Post-operative Plan:   Informed Consent: I have reviewed the patients History and Physical, chart, labs and discussed the procedure including the risks, benefits and alternatives for the proposed anesthesia with the patient or authorized representative who has indicated his/her understanding and acceptance.   Dental advisory given  Plan Discussed with: CRNA, Anesthesiologist and Surgeon  Anesthesia Plan Comments: (Discussed risks and benefits of and differences between spinal and general. Discussed risks of spinal including headache, backache, failure, bleeding, infection, and nerve damage. Patient  consents to spinal. Questions answered. Coagulation studies and platelet count acceptable.)        Anesthesia Quick Evaluation

## 2016-05-05 NOTE — Interval H&P Note (Signed)
History and Physical Interval Note:  05/05/2016 4:19 PM  [name].  has presented today for surgery, with the diagnosis of left hip osteoarthritis  The various methods of treatment have been discussed with the patient and family. After consideration of risks, benefits and other options for treatment, the patient has consented to  Procedure(s): LEFT TOTAL HIP ARTHROPLASTY ANTERIOR APPROACH (Left) as a surgical intervention .  The patient's history has been reviewed, patient examined, no change in status, stable for surgery.  I have reviewed the patient's chart and labs.  Questions were answered to the patient's satisfaction.     Gearlean Alf

## 2016-05-05 NOTE — Anesthesia Postprocedure Evaluation (Signed)
Anesthesia Post Note  Patient: [name].  Procedure(s) Performed: Procedure(s) (LRB): LEFT TOTAL HIP ARTHROPLASTY ANTERIOR APPROACH (Left)  Patient location during evaluation: PACU Anesthesia Type: MAC and Spinal Level of consciousness: oriented and awake and alert Pain management: pain level controlled Vital Signs Assessment: post-procedure vital signs reviewed and stable Respiratory status: spontaneous breathing, respiratory function stable and patient connected to nasal cannula oxygen Cardiovascular status: blood pressure returned to baseline and stable Postop Assessment: no headache, no backache, spinal receding, no signs of nausea or vomiting and patient able to bend at knees Anesthetic complications: no       Last Vitals:  Vitals:   05/05/16 2030 05/05/16 2045  BP: (!) 176/73 (!) 178/81  Pulse: 64 62  Resp: 16 16  Temp:      Last Pain:  Vitals:   05/05/16 1940  TempSrc:   PainSc: 0-No pain                 Catalina Gravel

## 2016-05-05 NOTE — H&P (View-Only) (Signed)
Yetta Glassman DOB: [birth year] Widowed / Language: Cleophus Molt / Race: White Male Date of Admission:  05/05/2016 CC:  left Hip Pain History of Present Illness The patient is a 83 year old male who comes in for a preoperative History and Physical. The patient is scheduled for a left total hip arthroplasty (anterior) to be performed by Dr. Dione Plover. Aluisio, MD at [facility] on 05-05-2016. The patient is a 83 year old male who presented with a left hip problem. The patient was seen for a second opinion.The patient reports left hip problems including pain and giving way symptoms that have been present for 1 year(s). The symptoms began without any known injury. Symptoms reported include hip pain and pain with weightbearing The patient reports symptoms radiating to the: left groin, left thigh and left knee. The patient describes the hip problem as aching (soreness). The patient feels as if their symptoms are does feel they are worsening. Symptoms are exacerbated by movement, flexing hip, squatting, weight bearing and walking. Symptoms are relieved by rest. Current treatment includes use of a walker (cane) and nonsteroidal anti-inflammatory drugs (Tylenol). Previous workup for this problem has included hip MRI. Previous treatment for this problem has included nonsteroidal anti-inflammatory drugs (IA injection s/p 11 months ago, helped for 2 days.). Note for "Hip problem": HX left knee OA, 11/19/15: cortisone injection: did not help. He has had problems with the hip for about a year now. He had some pain even before that, that has gotten progressively worse. 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 The pain became too difficult for him to tolerate, so he stopped playing tennis. He still walks, but is doing far less activity now than he did a year ago and the main culprit for this is his left hip. The pain can be intense at times. Occasionally, he will even have pain at  rest. Activity is far worse than rest. He is losing more mobility. He is not currently having any back pain, lower extremity weakness, or paresthesia with it. Right hip hurts, but to a much lesser degree. AP pelvis and lateral of the left hip which were taken today to compare to x-rays from a year ago showed that he has horrible bone-on-bone arthritis with femoral head erosion and a small amount of superior acetabular erosion. He has advanced erosive end-stage degenerative changes of the left hip. They are getting progressively worse and now he is even losing bone stock. At this point, the most predictable means of improving pain and function is total hip arthroplasty. The procedure, risks, potential complications and rehab course are discussed in detail and the patient elects to proceed.  They have been treated conservatively in the past for the above stated problem and despite conservative measures, they continue to have progressive pain and severe functional limitations and dysfunction. They have failed non-operative management including home exercise, medications, and activity modification. At this point that they would benefit from undergoing total joint replacement. Risks and benefits of the procedure have been discussed with the patient and they elect to proceed with surgery. There are no active contraindications to surgery such as ongoing infection or rapidly progressive neurological disease.   Problem List/Past Medical Primary osteoarthritis of left hip (M16.12)  Tenosynovitis, foot/ankle (727.06) [08/25/1999]: Asthma  Gout  Prostate Disease  Enlarged Prostate Ulcer disease  Impaired Vision  Impaired Hearing  Macular Degeneration  Cataract   Allergies  No Known Drug  Allergies  Family History  Cancer  Sister. Cerebrovascular Accident  Father, Mother. Congestive Heart Failure  Sister. First Degree Relatives  reported Heart disease in male family member before age 38    Social History Current work status  retired Furniture conservator/restorer daily; does running / walking and gym / [employer] Living situation  live alone Marital status  widowed Never consumed alcohol  04/15/2016: Never consumed alcohol No history of drug/alcohol rehab  Not under pain contract  Number of flights of stairs before winded  4-5 Tobacco / smoke exposure  04/15/2016: no Tobacco use  Never smoker. 04/15/2016 Advance Directives  Living Will, Healthcare POA  Medication History Tylenol ('325MG'$  Tablet, 1 (one) Oral) Active. MiraLax (Oral) Active. Benefiber On The GO (Oral) Active. Fish Oil ('1000MG'$  Capsule, Oral) Active. Vision Vitamins (Oral) Active. Calcium (1200-'1000MG'$ -UNIT Tablet Chewable, Oral) Active. Vitamin D (2000UNIT Capsule, Oral) Active. Vitamin B12 (1000MCG Tablet ER, Oral) Active. Aspirin ('81MG'$  Tablet DR, 1 (one) Oral) Active. Chlorthalidone ('15MG'$  Tablet, Oral) Active. Finasteride ('5MG'$  Tablet, Oral) Active. Rosuvastatin Calcium ('5MG'$  Tablet, Oral) Active.  Past Surgical History Cataract Surgery  bilateral Mastoidectomy  bilateral   Review of Systems General Not Present- Chills, Fatigue, Fever, Memory Loss, Night Sweats, Weight Gain and Weight Loss. Skin Not Present- Eczema, Hives, Itching, Lesions and Rash. HEENT Present- Hearing Loss. Not Present- Dentures, Double Vision, Headache, Tinnitus and Visual Loss. Respiratory Present- Cough. Not Present- Allergies, Chronic Cough, Coughing up blood, Shortness of breath at rest and Shortness of breath with exertion. Cardiovascular Not Present- Chest Pain, Difficulty Breathing Lying Down, Murmur, Palpitations, Racing/skipping heartbeats and Swelling. Gastrointestinal Present- Constipation. Not Present- Abdominal Pain, Bloody Stool, Diarrhea, Difficulty Swallowing, Heartburn, Jaundice, Loss of appetitie, Nausea and Vomiting. Male Genitourinary Present- Urinary frequency and Urinating at Night. Not  Present- Blood in Urine, Discharge, Flank Pain, Incontinence, Painful Urination, Urgency, Urinary Retention and Weak urinary stream. Musculoskeletal Present- Joint Pain. Not Present- Back Pain, Joint Swelling, Morning Stiffness, Muscle Pain, Muscle Weakness and Spasms. Neurological Not Present- Blackout spells, Difficulty with balance, Dizziness, Paralysis, Tremor and Weakness. Psychiatric Not Present- Insomnia.  Vitals  Weight: 130 lb Height: 67in Weight was reported by patient. Height was reported by patient. Body Surface Area: 1.68 m Body Mass Index: 20.36 kg/m  Pulse: 68 (Regular)  BP: 148/68 (Sitting, Right Arm, Standard)   Physical Exam General Mental Status -Alert, cooperative and good historian. General Appearance-pleasant, Not in acute distress. Orientation-Oriented X3. Build & Nutrition-Well nourished and Well developed.  Head and Neck Head-normocephalic, atraumatic . Neck Global Assessment - bruit auscultated on the left and supple, no bruit auscultated on the right. Carotid Arteries - Left - bruit(slight present on left caroitd area).  Eye Vision-Wears corrective lenses. Pupil - Bilateral-Regular and Round. Motion - Bilateral-EOMI.  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 Adventitious sounds - No Adventitious sounds.  Cardiovascular Auscultation Rhythm - Regular rate and rhythm. Heart Sounds - S1 WNL and S2 WNL. Murmurs & Other Heart Sounds - Auscultation of the heart reveals - No Murmurs.  Abdomen Palpation/Percussion Tenderness - Abdomen is non-tender to palpation. Rigidity (guarding) - Abdomen is soft. Auscultation Auscultation of the abdomen reveals - Bowel sounds normal.  Male Genitourinary Note: Not done, not pertinent to present illness   Musculoskeletal Note:  Well-developed male, appears much younger than his stated age. He is alert and oriented, in no  apparent distress. Right hip flex 110, rotate in 20 out 30, abduct 30 without discomfort. Left hip flexion 90, no internal or external rotation, only about 10 to 20 degrees of abduction. He is short about half inch on the left compared to the right. He has a significantly antalgic gait pattern. Pulses, sensation, and motor are intact distally.  RADIOGRAPHS AP pelvis and lateral of the left hip which were taken today to compare to x-rays from a year ago showed that he has horrible bone-on-bone arthritis with femoral head erosion and a small amount of superior acetabular erosion. It was nowhere near this bad a year ago.  Assessment & Plan  Primary osteoarthritis of left hip (M16.12)  Note:Surgical Plans: Left Total Hip Replacement - Anterior Approach  Disposition: Home  PCP: Dr. Crist Infante - Patient has been seen preoperatively and felt to be stable for surgery.  IV TXA  Anesthesia Issues: None  Patient was instructed on what medications to stop prior to surgery.  Please talk to patient when waking up into the right ear due to complete hearing loss in the left ear.  Signed electronically by Ok Edwards, III PA-C

## 2016-05-05 NOTE — Anesthesia Procedure Notes (Signed)
Spinal  Patient location during procedure: OR Start time: 05/05/2016 6:10 PM End time: 05/05/2016 6:12 PM Staffing Anesthesiologist: Catalina Gravel Performed: anesthesiologist  Preanesthetic Checklist Completed: patient identified, surgical consent, pre-op evaluation, timeout performed, IV checked, risks and benefits discussed and monitors and equipment checked Spinal Block Patient position: sitting Prep: site prepped and draped and DuraPrep Patient monitoring: continuous pulse ox and blood pressure Approach: midline Location: L3-4 Needle Needle type: Pencan and Sprotte  Needle gauge: 24 G Needle length: 9 cm Catheter type: closed end Assessment Sensory level: T8 Additional Notes Functioning IV was confirmed and monitors were applied. Sterile prep and drape, including hand hygiene, mask and sterile gloves were used. The patient was positioned and the spine was prepped. The skin was anesthetized with lidocaine.  Free flow of clear CSF was obtained prior to injecting local anesthetic into the CSF.  The spinal needle aspirated freely following injection.  The needle was carefully withdrawn.  The patient tolerated the procedure well. Consent was obtained prior to procedure with all questions answered and concerns addressed. Risks including but not limited to bleeding, infection, nerve damage, paralysis, failed block, inadequate analgesia, allergic reaction, high spinal, itching and headache were discussed and the patient wished to proceed.   Donald Morn, MD

## 2016-05-05 NOTE — Op Note (Signed)
OPERATIVE REPORT- TOTAL HIP ARTHROPLASTY   PREOPERATIVE DIAGNOSIS: Osteoarthritis of the Left hip.   POSTOPERATIVE DIAGNOSIS: Osteoarthritis of the Left  hip.   PROCEDURE: Left total hip arthroplasty, anterior approach.   SURGEON: Gaynelle Arabian, MD   ASSISTANT: Arlee Muslim, PA-C  ANESTHESIA:  Spinal  ESTIMATED BLOOD LOSS:-250 ml   DRAINS: Hemovac x1.   COMPLICATIONS: None   CONDITION: PACU - hemodynamically stable.   BRIEF CLINICAL NOTE: [name]. is a 83 y.o. male who has advanced end-  stage arthritis of their Left  hip with progressively worsening pain and  dysfunction.The patient has failed nonoperative management and presents for  total hip arthroplasty.   PROCEDURE IN DETAIL: After successful administration of spinal  anesthetic, the traction boots for the [facility] bed were placed on both  feet and the patient was placed onto the [facility] bed, boots placed into the leg  holders. The Left hip was then isolated from the perineum with plastic  drapes and prepped and draped in the usual sterile fashion. ASIS and  greater trochanter were marked and a oblique incision was made, starting  at about 1 cm lateral and 2 cm distal to the ASIS and coursing towards  the anterior cortex of the femur. The skin was cut with a 10 blade  through subcutaneous tissue to the level of the fascia overlying the  tensor fascia lata muscle. The fascia was then incised in line with the  incision at the junction of the anterior third and posterior 2/3rd. The  muscle was teased off the fascia and then the interval between the TFL  and the rectus was developed. The Hohmann retractor was then placed at  the top of the femoral neck over the capsule. The vessels overlying the  capsule were cauterized and the fat on top of the capsule was removed.  A Hohmann retractor was then placed anterior underneath the rectus  femoris to give exposure to the entire anterior capsule. A T-shaped   capsulotomy was performed. The edges were tagged and the femoral head  was identified.       Osteophytes are removed off the superior acetabulum.  The femoral neck was then cut in situ with an oscillating saw. Traction  was then applied to the left lower extremity utilizing the [facility]  traction. The femoral head was then removed. Retractors were placed  around the acetabulum and then circumferential removal of the labrum was  performed. Osteophytes were also removed. Reaming starts at 45 mm to  medialize and  Increased in 2 mm increments to 49 mm. We reamed in  approximately 40 degrees of abduction, 20 degrees anteversion. A 50 mm  pinnacle acetabular shell was then impacted in anatomic position under  fluoroscopic guidance with excellent purchase. We did not need to place  any additional dome screws. A 32 mm neutral + 4 marathon liner was then  placed into the acetabular shell.       The femoral lift was then placed along the lateral aspect of the femur  just distal to the vastus ridge. The leg was  externally rotated and capsule  was stripped off the inferior aspect of the femoral neck down to the  level of the lesser trochanter, this was done with electrocautery. The femur was lifted after this was performed. The  leg was then placed in an extended and adducted position essentially delivering the femur. We also removed the capsule superiorly and the piriformis from the piriformis  fossa to gain excellent exposure of the  proximal femur. Rongeur was used to remove some cancellous bone to get  into the lateral portion of the proximal femur for placement of the  initial starter reamer. The starter broaches was placed  the starter broach  and was shown to go down the center of the canal. Broaching  with the  Corail system was then performed starting at size 8, coursing  Up to size 11. A size 11 had excellent torsional and rotational  and axial stability. The trial high offset neck was then  placed  with a 32 + 5 trial head. The hip was then reduced. We confirmed that  the stem was in the canal both on AP and lateral x-rays. It also has excellent sizing. The hip was reduced with outstanding stability through full extension and full external rotation.. AP pelvis was taken and the leg lengths were measured and found to be equal. Hip was then dislocated again and the femoral head and neck removed. The  femoral broach was removed. Size 11 Corail stem with a high offset  neck was then impacted into the femur following native anteversion. Has  excellent purchase in the canal. Excellent torsional and rotational and  axial stability. It is confirmed to be in the canal on AP and lateral  fluoroscopic views. The 32 + 5 ceramic head was placed and the hip  reduced with outstanding stability. Again AP pelvis was taken and it  confirmed that the leg lengths were equal. The wound was then copiously  irrigated with saline solution and the capsule reattached and repaired  with Ethibond suture. 30 ml of .25% Bupivicaine was  injected into the capsule and into the edge of the tensor fascia lata as well as subcutaneous tissue. The fascia overlying the tensor fascia lata was then closed with a running #1 V-Loc. Subcu was closed with interrupted 2-0 Vicryl and subcuticular running 4-0 Monocryl. Incision was cleaned  and dried. Steri-Strips and a bulky sterile dressing applied. Hemovac  drain was hooked to suction and then the patient was awakened and transported to  recovery in stable condition.        Please note that a surgical assistant was a medical necessity for this procedure to perform it in a safe and expeditious manner. Assistant was necessary to provide appropriate retraction of vital neurovascular structures and to prevent femoral fracture and allow for anatomic placement of the prosthesis.  Gaynelle Arabian, M.D.

## 2016-05-06 ENCOUNTER — Encounter (HOSPITAL_COMMUNITY): Payer: Self-pay | Admitting: Orthopedic Surgery

## 2016-05-06 LAB — BASIC METABOLIC PANEL
Anion gap: 5 (ref 5–15)
BUN: 18 mg/dL (ref 6–20)
CALCIUM: 9.4 mg/dL (ref 8.9–10.3)
CHLORIDE: 106 mmol/L (ref 101–111)
CO2: 29 mmol/L (ref 22–32)
CREATININE: 1.04 mg/dL (ref 0.61–1.24)
GFR calc non Af Amer: >60 mL/min (ref >60)
GLUCOSE: 178 mg/dL — AB (ref 65–99)
Potassium: 4.5 mmol/L (ref 3.5–5.1)
Sodium: 140 mmol/L (ref 135–145)

## 2016-05-06 LAB — CBC
HEMATOCRIT: 30.1 % — AB (ref 39.0–52.0)
HEMOGLOBIN: 10.2 g/dL — AB (ref 13.0–17.0)
MCH: 31.4 pg (ref 26.0–34.0)
MCHC: 33.9 g/dL (ref 30.0–36.0)
MCV: 92.6 fL (ref 78.0–100.0)
Platelets: 189 10*3/uL (ref 150–400)
RBC: 3.25 MIL/uL — ABNORMAL LOW (ref 4.22–5.81)
RDW: 12.7 % (ref 11.5–15.5)
WBC: 6.5 10*3/uL (ref 4.0–10.5)

## 2016-05-06 LAB — TYPE AND SCREEN
ABO/RH(D): A POS
Antibody Screen: NEGATIVE

## 2016-05-06 MED ORDER — ENSURE ENLIVE PO LIQD
237.0000 mL | Freq: Two times a day (BID) | ORAL | Status: DC
  Administered 2016-05-07: 10:00:00 237 mL via ORAL

## 2016-05-06 MED ORDER — ADULT MULTIVITAMIN W/MINERALS CH
1.0000 | ORAL_TABLET | Freq: Every day | ORAL | Status: DC
  Filled 2016-05-06: qty 1

## 2016-05-06 NOTE — Evaluation (Signed)
Physical Therapy Evaluation Patient Details Name: [name]. MRN: [id number] DOB: 1925/02/02 Today's Date: 05/06/2016   History of Present Illness  Pt s/p L THR   Clinical Impression  Pt s/p L THR and presents with decreased L LE strength/ROM and post op pain limiting functional mobility.  Pt should progress to dc home with assist of family and plans follow up as OP.    Follow Up Recommendations Outpatient PT (Pt states he starts OP on 05/10/16)    Equipment Recommendations  None recommended by PT    Recommendations for Other Services OT consult     Precautions / Restrictions Precautions Precautions: Fall Restrictions Weight Bearing Restrictions: No      Mobility  Bed Mobility                  Transfers Overall transfer level: Needs assistance Equipment used: Rolling walker (2 wheeled) Transfers: Sit to/from [facility] Sit to Stand: Min assist         General transfer comment: VC for safety and technique  Ambulation/Gait Ambulation/Gait assistance: Min assist Ambulation Distance (Feet): 120 Feet Assistive device: Rolling walker (2 wheeled) Gait Pattern/deviations: Step-to pattern;Step-through pattern;Decreased step length - right;Decreased step length - left;Shuffle;Trunk flexed Gait velocity: decr Gait velocity interpretation: Below normal speed for age/gender General Gait Details: cues for posture, position from RW and initial sequence  Stairs            Wheelchair Mobility    Modified Rankin (Stroke Patients Only)       Balance                                             Pertinent Vitals/Pain Pain Assessment: 0-10 Pain Score: 2  Pain Location: L hip Pain Descriptors / Indicators: Sore Pain Intervention(s): Limited activity within patient's tolerance;Monitored during session    Home Living Family/patient expects to be discharged to:: Private residence Living Arrangements: Alone Available  Help at Discharge: Family Type of Home: House Home Access: Level entry     Home Layout: One level Home Equipment: Environmental consultant - 2 wheels;Toilet riser;Grab bars - toilet;Grab bars - tub/shower;Hand held shower head;Shower seat - built in Additional Comments: Daughter will be staying with    Prior Function Level of Independence: Independent               Hand Dominance        Extremity/Trunk Assessment   Upper Extremity Assessment Upper Extremity Assessment: Overall WFL for tasks assessed    Lower Extremity Assessment Lower Extremity Assessment: LLE deficits/detail       Communication   Communication: HOH  Cognition Arousal/Alertness: Awake/alert Behavior During Therapy: WFL for tasks assessed/performed Overall Cognitive Status: Within Functional Limits for tasks assessed                      General Comments      Exercises     Assessment/Plan    PT Assessment Patient needs continued PT services  PT Problem List Decreased strength;Decreased range of motion;Decreased activity tolerance;Decreased balance;Decreased mobility;Decreased knowledge of use of DME;Pain       PT Treatment Interventions DME instruction;Gait training;Stair training;Functional mobility training;Therapeutic activities;Therapeutic exercise;Patient/family education    PT Goals (Current goals can be found in the Care Plan section)  Acute Rehab PT Goals Patient Stated Goal: home - maybe play tennis PT  Goal Formulation: With patient Time For Goal Achievement: 05/09/16 Potential to Achieve Goals: Good    Frequency 7X/week   Barriers to discharge        Co-evaluation               End of Session Equipment Utilized During Treatment: Gait belt Activity Tolerance: Patient tolerated treatment well Patient left: in chair;with call bell/phone within reach;with family/visitor present Nurse Communication: Mobility status PT Visit Diagnosis: Difficulty in walking, not elsewhere  classified (R26.2)         Time: 7847-8412 PT Time Calculation (min) (ACUTE ONLY): 20 min   Charges:   PT Evaluation $PT Eval Low Complexity: 1 Procedure     PT G Codes:         Sulamita Lafountain 05/06/2016, 1:05 PM

## 2016-05-06 NOTE — Care Management Note (Signed)
Case Management Note  Patient Details  Name: [name]. MRN: [id number] Date of Birth: September 17, 1924  Subjective/Objective:                  LEFT TOTAL HIP ARTHROPLASTY ANTERIOR APPROACH (Left) Action/Plan: Discharge planning Expected Discharge Date:                  Expected Discharge Plan:  Home/Self Care  In-House Referral:     Discharge planning Services  CM Consult  Post Acute Care Choice:  NA Choice offered to:  NA  DME Arranged:  N/A DME Agency:  NA  HH Arranged:  NA HH Agency:  NA  Status of Service:  Completed, signed off  If discussed at [place] of Stay Meetings, dates discussed:    Additional Comments: CM met with pt and pt's daughter in room to confirm plan is for outpt PT; family confirms. Pt states he has all DME needed at home. No other CM needs were communicated. Dellie Catholic, RN 05/06/2016, 11:40 AM

## 2016-05-06 NOTE — Discharge Instructions (Addendum)
° °Dr. Frank Aluisio °Total Joint Specialist °[facility] °[address] °, [place] [address] °[phone] ° °ANTERIOR APPROACH TOTAL HIP REPLACEMENT POSTOPERATIVE DIRECTIONS ° ° °Hip Rehabilitation, Guidelines Following Surgery  °The results of a hip operation are greatly improved after range of motion and muscle strengthening exercises. Follow all safety measures which are given to protect your hip. If any of these exercises cause increased pain or swelling in your joint, decrease the amount until you are comfortable again. Then slowly increase the exercises. Call your caregiver if you have problems or questions.  ° °HOME CARE INSTRUCTIONS  °Remove items at home which could result in a fall. This includes throw rugs or furniture in walking pathways.  °· ICE to the affected hip every three hours for 30 minutes at a time and then as needed for pain and swelling.  Continue to use ice on the hip for pain and swelling from surgery. You may notice swelling that will progress down to the foot and ankle.  This is normal after surgery.  Elevate the leg when you are not up walking on it.   °· Continue to use the breathing machine which will help keep your temperature down.  It is common for your temperature to cycle up and down following surgery, especially at night when you are not up moving around and exerting yourself.  The breathing machine keeps your lungs expanded and your temperature down. ° ° °DIET °You may resume your previous home diet once your are discharged from the hospital. ° °DRESSING / WOUND CARE / SHOWERING °You may shower 3 days after surgery, but keep the wounds dry during showering.  You may use an occlusive plastic wrap (Press'n Seal for example), NO SOAKING/SUBMERGING IN THE BATHTUB.  If the bandage gets wet, change with a clean dry gauze.  If the incision gets wet, pat the wound dry with a clean towel. °You may start showering once you are discharged home but do not  submerge the incision under water. Just pat the incision dry and apply a dry gauze dressing on daily. °Change the surgical dressing daily and reapply a dry dressing each time. ° °ACTIVITY °Walk with your walker as instructed. °Use walker as long as suggested by your caregivers. °Avoid periods of inactivity such as sitting longer than an hour when not asleep. This helps prevent blood clots.  °You may resume a sexual relationship in one month or when given the OK by your doctor.  °You may return to work once you are cleared by your doctor.  °Do not drive a car for 6 weeks or until released by you surgeon.  °Do not drive while taking narcotics. ° °WEIGHT BEARING °Weight bearing as tolerated with assist device (walker, cane, etc) as directed, use it as long as suggested by your surgeon or therapist, typically at least 4-6 weeks. ° °POSTOPERATIVE CONSTIPATION PROTOCOL °Constipation - defined medically as fewer than three stools per week and severe constipation as less than one stool per week. ° °One of the most common issues patients have following surgery is constipation.  Even if you have a regular bowel pattern at home, your normal regimen is likely to be disrupted due to multiple reasons following surgery.  Combination of anesthesia, postoperative narcotics, change in appetite and fluid intake all can affect your bowels.  In order to avoid complications following surgery, here are some recommendations in order to help you during your recovery period. ° °Colace (docusate) - Pick up an over-the-counter   form of Colace or another stool softener and take twice a day as long as you are requiring postoperative pain medications.  Take with a full glass of water daily.  If you experience loose stools or diarrhea, hold the colace until you stool forms back up.  If your symptoms do not get better within 1 week or if they get worse, check with your doctor. ° °Dulcolax (bisacodyl) - Pick up over-the-counter and take as directed  by the product packaging as needed to assist with the movement of your bowels.  Take with a full glass of water.  Use this product as needed if not relieved by Colace only.  ° °MiraLax (polyethylene glycol) - Pick up over-the-counter to have on hand.  MiraLax is a solution that will increase the amount of water in your bowels to assist with bowel movements.  Take as directed and can mix with a glass of water, juice, soda, coffee, or tea.  Take if you go more than two days without a movement. °Do not use MiraLax more than once per day. Call your doctor if you are still constipated or irregular after using this medication for 7 days in a row. ° °If you continue to have problems with postoperative constipation, please contact the office for further assistance and recommendations.  If you experience "the worst abdominal pain ever" or develop nausea or vomiting, please contact the office immediatly for further recommendations for treatment. ° °ITCHING ° If you experience itching with your medications, try taking only a single pain pill, or even half a pain pill at a time.  You can also use Benadryl over the counter for itching or also to help with sleep.  ° °TED HOSE STOCKINGS °Wear the elastic stockings on both legs for three weeks following surgery during the day but you may remove then at night for sleeping. ° °MEDICATIONS °See your medication summary on the “After Visit Summary” that the nursing staff will review with you prior to discharge.  You may have some home medications which will be placed on hold until you complete the course of blood thinner medication.  It is important for you to complete the blood thinner medication as prescribed by your surgeon.  Continue your approved medications as instructed at time of discharge. ° °PRECAUTIONS °If you experience chest pain or shortness of breath - call 911 immediately for transfer to the hospital emergency department.  °If you develop a fever greater that 101 F,  purulent drainage from wound, increased redness or drainage from wound, foul odor from the wound/dressing, or calf pain - CONTACT YOUR SURGEON.   °                                                °FOLLOW-UP APPOINTMENTS °Make sure you keep all of your appointments after your operation with your surgeon and caregivers. You should call the office at the above phone number and make an appointment for approximately two weeks after the date of your surgery or on the date instructed by your surgeon outlined in the "After Visit Summary". ° °RANGE OF MOTION AND STRENGTHENING EXERCISES  °These exercises are designed to help you keep full movement of your hip joint. Follow your caregiver's or physical therapist's instructions. Perform all exercises about fifteen times, three times per day or as directed. Exercise both hips, even if you   have had only one joint replacement. These exercises can be done on a training (exercise) mat, on the floor, on a table or on a bed. Use whatever works the best and is most comfortable for you. Use music or television while you are exercising so that the exercises are a pleasant break in your day. This will make your life better with the exercises acting as a break in routine you can look forward to.  Lying on your back, slowly slide your foot toward your buttocks, raising your knee up off the floor. Then slowly slide your foot back down until your leg is straight again.  Lying on your back spread your legs as far apart as you can without causing discomfort.  Lying on your side, raise your upper leg and foot straight up from the floor as far as is comfortable. Slowly lower the leg and repeat.  Lying on your back, tighten up the muscle in the front of your thigh (quadriceps muscles). You can do this by keeping your leg straight and trying to raise your heel off the floor. This helps strengthen the largest muscle supporting your knee.  Lying on your back, tighten up the muscles of your  buttocks both with the legs straight and with the knee bent at a comfortable angle while keeping your heel on the floor.   IF YOU ARE TRANSFERRED TO A SKILLED REHAB FACILITY If the patient is transferred to a skilled rehab facility following release from the hospital, a list of the current medications will be sent to the facility for the patient to continue.  When discharged from the skilled rehab facility, please have the facility set up the patient's [place] prior to being released. Also, the skilled facility will be responsible for providing the patient with their medications at time of release from the facility to include their pain medication, the muscle relaxants, and their blood thinner medication. If the patient is still at the rehab facility at time of the two week follow up appointment, the skilled rehab facility will also need to assist the patient in arranging follow up appointment in our office and any transportation needs.  MAKE SURE YOU:  Understand these instructions.  Get help right away if you are not doing well or get worse.    Pick up stool softner and laxative for home use following surgery while on pain medications. Do not submerge incision under water. Please use good hand washing techniques while changing dressing each day. May shower starting three days after surgery. Please use a clean towel to pat the incision dry following showers. Continue to use ice for pain and swelling after surgery. Do not use any lotions or creams on the incision until instructed by your surgeon.  Take Xarelto for two and a half more weeks following discharge from the hospital, then discontinue Xarelto. Once the patient has completed the Xarelto, they may resume the 81 mg Aspirin.    Information on my medicine - XARELTO (Rivaroxaban)  This medication education was reviewed with me or my healthcare representative as part of my discharge preparation.   Why was Xarelto  prescribed for you? Xarelto was prescribed for you to reduce the risk of blood clots forming after orthopedic surgery. The medical term for these abnormal blood clots is venous thromboembolism (VTE).  What do you need to know about xarelto ? Take your Xarelto ONCE DAILY at the same time every day. You may take it either with or without food.  If you have difficulty swallowing the tablet whole, you may crush it and mix in applesauce just prior to taking your dose.  Take Xarelto exactly as prescribed by your doctor and DO NOT stop taking Xarelto without talking to the doctor who prescribed the medication.  Stopping without other VTE prevention medication to take the place of Xarelto may increase your risk of developing a clot.  After discharge, you should have regular check-up appointments with your healthcare provider that is prescribing your Xarelto.    What do you do if you miss a dose? If you miss a dose, take it as soon as you remember on the same day then continue your regularly scheduled once daily regimen the next day. Do not take two doses of Xarelto on the same day.   Important Safety Information A possible side effect of Xarelto is bleeding. You should call your healthcare provider right away if you experience any of the following: ? Bleeding from an injury or your nose that does not stop. ? Unusual colored urine (red or dark brown) or unusual colored stools (red or black). ? Unusual bruising for unknown reasons. ? A serious fall or if you hit your head (even if there is no bleeding).  Some medicines may interact with Xarelto and might increase your risk of bleeding while on Xarelto. To help avoid this, consult your healthcare provider or pharmacist prior to using any new prescription or non-prescription medications, including herbals, vitamins, non-steroidal anti-inflammatory drugs (NSAIDs) and supplements.  This website has more information on Xarelto:  [email or web address]

## 2016-05-06 NOTE — Progress Notes (Addendum)
   Subjective: 1 Day Post-Op Procedure(s) (LRB): LEFT TOTAL HIP ARTHROPLASTY ANTERIOR APPROACH (Left) Patient reports pain as mild.   Patient seen in rounds by Dr. Wynelle Link. Patient is well, and has had no acute complaints or problems We will start therapy today.  Plan is to go Home after hospital stay.  Objective: Vital signs in last 24 hours: Temp:  [97.2 F (36.2 C)-98.4 F (36.9 C)] 97.6 F (36.4 C) (03/08 0618) Pulse Rate:  [59-76] 65 (03/08 0618) Resp:  [15-19] 17 (03/08 0618) BP: (129-188)/(56-91) 151/70 (03/08 0618) SpO2:  [98 %-100 %] 100 % (03/08 0618) Weight:  [58.5 kg (129 lb)] 58.5 kg (129 lb) (03/07 1517)  Intake/Output from previous day:  Intake/Output Summary (Last 24 hours) at 05/06/16 0815 Last data filed at 05/06/16 0600  Gross per 24 hour  Intake          2408.33 ml  Output           1977.5 ml  Net           430.83 ml    Intake/Output this shift: No intake/output data recorded.  Labs:  Recent Labs  05/06/16 0428  HGB 10.2*    Recent Labs  05/06/16 0428  WBC 6.5  RBC 3.25*  HCT 30.1*  PLT 189    Recent Labs  05/06/16 0428  NA 140  K 4.5  CL 106  CO2 29  BUN 18  CREATININE 1.04  GLUCOSE 178*  CALCIUM 9.4   No results for input(s): LABPT, INR in the last 72 hours.  EXAM General - Patient is Alert, Appropriate and Oriented Extremity - Neurovascular intact Sensation intact distally Intact pulses distally Dressing - dressing C/D/I Motor Function - intact, moving foot and toes well on exam.  Hemovac pulled without difficulty. (patient is hard of hearing)  Past Medical History:  Diagnosis Date  . Anemia   . Arthritis   . Cataract   . Complication following bilateral mastoidectomy   . Dysphagia    with large pills  . Gout   . Wrist fracture    left    Assessment/Plan: 1 Day Post-Op Procedure(s) (LRB): LEFT TOTAL HIP ARTHROPLASTY ANTERIOR APPROACH (Left) Principal Problem:   OA (osteoarthritis) of hip  Estimated  body mass index is 19.91 kg/m as calculated from the following:   Height as of this encounter: 5' 7.5" (1.715 m).   Weight as of this encounter: 58.5 kg (129 lb). Advance diet Up with therapy Plan for discharge tomorrow Discharge home - straight to outpatient therapy Monday.  DVT Prophylaxis - Xarelto Weight Bearing As Tolerated left Leg Hemovac Pulled Begin Therapy  Arlee Muslim, PA-C Orthopaedic Surgery 05/06/2016, 8:15 AM

## 2016-05-06 NOTE — Progress Notes (Signed)
Initial Nutrition Assessment  DOCUMENTATION CODES:   Underweight  INTERVENTION:   Ensure Enlive po BID, each supplement provides 350 kcal and 20 grams of protein  MVI  NUTRITION DIAGNOSIS:   Increased nutrient needs related to other (see comment) (hip surgery) as evidenced by increased estimated needs from protein.  GOAL:   Patient will meet greater than or equal to 90% of their needs  MONITOR:   PO intake, Supplement acceptance, Weight trends, Labs  REASON FOR ASSESSMENT:   Other (Comment) (Low BMI)    ASSESSMENT:   83 y/o male presents for left total hip arthroplasty (anterior) 3/7.    Met with pt in room today. Pt reports good appetite pta and currently. Per chart, pt has lost 5lbs(4%) over the past 6 months; this is not significant. RD discussed with pt the importance of adequate protein and calcium intake. Pt would like to try Ensure; RD will order.   Medications reviewed and include: colace  Labs reviewed: Hgb 10.2(L), Hct 30.1(L)  Nutrition-Focused physical exam completed. Findings are mild fat depletion in chest and upper arms, moderate muscle depletion over entire body, and mild edema.   Diet Order:  Diet regular Room service appropriate? Yes; Fluid consistency: Thin  Skin:  Reviewed, no issues  Last BM:  3/6  Height:   Ht Readings from Last 1 Encounters:  05/05/16 5' 7.5" (1.715 m)    Weight:   Wt Readings from Last 1 Encounters:  05/05/16 129 lb (58.5 kg)    Ideal Body Weight:  68.6 kg  BMI:  Body mass index is 19.91 kg/m.  Estimated Nutritional Needs:   Kcal:  1750-2050kcal/day   Protein:  82-94g/day   Fluid:  >1.7L/day   EDUCATION NEEDS:   No education needs identified at this time  Koleen Distance, RD, LDN Pager #[phone]

## 2016-05-06 NOTE — Evaluation (Signed)
Occupational Therapy Evaluation Patient Details Name: [name]. MRN: [id number] DOB: 11/20/1924 Today's Date: 05/06/2016    History of Present Illness LEFT TOTAL HIP ARTHROPLASTY ANTERIOR APPROACH    Clinical Impression   Pt is s/p THA resulting in the deficits listed below (see OT Problem List).  Pt will benefit from skilled OT to increase their safety and independence with ADL and functional mobility for ADL to facilitate discharge to venue listed below.        Follow Up Recommendations  No OT follow up    Equipment Recommendations  None recommended by OT       Precautions / Restrictions Precautions Precautions: Fall Restrictions Weight Bearing Restrictions: No      Mobility Bed Mobility Overal bed mobility: Needs Assistance Bed Mobility: Supine to Sit     Supine to sit: Min assist        Transfers Overall transfer level: Needs assistance Equipment used: Rolling walker (2 wheeled) Transfers: Sit to/from [facility] Sit to Stand: Min assist Stand pivot transfers: Min assist       General transfer comment: VC for safety and technique         ADL Overall ADL's : Needs assistance/impaired     Grooming: Set up;Sitting   Upper Body Bathing: Set up;Sitting   Lower Body Bathing: Minimal assistance;Sit to/from stand;Cueing for safety;Cueing for sequencing   Upper Body Dressing : Set up;Sitting   Lower Body Dressing: Minimal assistance;Sit to/from stand;Cueing for safety;Cueing for sequencing   Toilet Transfer: Minimal assistance;RW;Ambulation;Cueing for sequencing;Cueing for safety   Toileting- Clothing Manipulation and Hygiene: Minimal assistance;Sit to/from stand       Functional mobility during ADLs: Minimal assistance;Rolling walker       Vision Patient Visual Report: No change from baseline              Pertinent Vitals/Pain Pain Assessment: 0-10 Pain Score: 2  Pain Descriptors / Indicators: Sore Pain  Intervention(s): Monitored during session     Hand Dominance     Extremity/Trunk Assessment Upper Extremity Assessment Upper Extremity Assessment: Overall WFL for tasks assessed           Communication Communication Communication: No difficulties   Cognition Arousal/Alertness: Awake/alert Behavior During Therapy: WFL for tasks assessed/performed Overall Cognitive Status: Within Functional Limits for tasks assessed                     General Comments    Pt enjoyed playing tennis!           Home Living Family/patient expects to be discharged to:: Private residence Living Arrangements: Alone Available Help at Discharge: Family Type of Home: House Home Access: Level entry     [place]: One level     Bathroom Shower/Tub: Teacher, early years/pre: [place]: Environmental consultant - 2 wheels;Toilet riser;Grab bars - toilet;Grab bars - tub/shower;Hand held shower head;Shower seat - built in          Prior Functioning/Environment Level of Independence: Independent                 OT Problem List: Decreased strength;Decreased activity tolerance      OT Treatment/Interventions: Self-care/ADL training;DME and/or AE instruction;Patient/family education    OT Goals(Current goals can be found in the care plan section) Acute Rehab OT Goals Patient Stated Goal: home - maybe play tennis OT Goal Formulation: With patient Time For Goal Achievement: 05/13/16  OT Frequency: Min 2X/week  Barriers to D/C:               End of Session Equipment Utilized During Treatment: Surveyor, mining Communication: Mobility status  Activity Tolerance: Patient tolerated treatment well Patient left: in chair  OT Visit Diagnosis: Unsteadiness on feet (R26.81)                ADL either performed or assessed with clinical judgement  Time: 1020-1038 OT Time Calculation (min): 18 min Charges:  OT General Charges $OT Visit: 1 Procedure OT  Evaluation $OT Eval Moderate Complexity: 1 Procedure G-Codes:     Kari Baars, OT [phone]  Payton Mccallum D 05/06/2016, 12:23 PM

## 2016-05-06 NOTE — Progress Notes (Signed)
  Physical Therapy Treatment Patient Details Name: [name]. MRN: [id number] DOB: Apr 24, 1924 Today's Date: 05/16/16    History of Present Illness Pt s/p L THR     PT Comments    Pt progressing well with mobility and hopeful for dc home tomorrow.   Follow Up Recommendations  Outpatient PT     Equipment Recommendations  None recommended by PT    Recommendations for Other Services OT consult     Precautions / Restrictions Precautions Precautions: Fall Restrictions Weight Bearing Restrictions: No    Mobility  Bed Mobility                  Transfers Overall transfer level: Needs assistance Equipment used: Rolling walker (2 wheeled) Transfers: Sit to/from Stand Sit to Stand: Min guard         General transfer comment: VC for safety and technique  Ambulation/Gait Ambulation/Gait assistance: Min assist;Min guard Ambulation Distance (Feet): 300 Feet Assistive device: Rolling walker (2 wheeled) Gait Pattern/deviations: Step-to pattern;Step-through pattern;Decreased step length - right;Decreased step length - left;Shuffle;Trunk flexed Gait velocity: decr Gait velocity interpretation: Below normal speed for age/gender General Gait Details: cues for posture, position from RW and initial sequence   Stairs            Wheelchair Mobility    Modified Rankin (Stroke Patients Only)       Balance                                    Cognition Arousal/Alertness: Awake/alert Behavior During Therapy: WFL for tasks assessed/performed Overall Cognitive Status: Within Functional Limits for tasks assessed                      Exercises Total Joint Exercises Ankle Circles/Pumps: AROM;Both;15 reps;Supine Quad Sets: AROM;Both;10 reps;Supine Heel Slides: AAROM;Left;20 reps;Supine Hip ABduction/ADduction: AAROM;Left;15 reps;Supine    General Comments        Pertinent Vitals/Pain Pain Assessment: 0-10 Pain Score: 2  Pain  Location: L hip Pain Descriptors / Indicators: Sore Pain Intervention(s): Monitored during session;Limited activity within patient's tolerance;Ice applied    Home Living                      Prior Function            PT Goals (current goals can now be found in the care plan section) Acute Rehab PT Goals Patient Stated Goal: home - maybe play tennis PT Goal Formulation: With patient Time For Goal Achievement: 05/09/16 Potential to Achieve Goals: Good Progress towards PT goals: Progressing toward goals    Frequency    7X/week      PT Plan Current plan remains appropriate    Co-evaluation             End of Session Equipment Utilized During Treatment: Gait belt Activity Tolerance: Patient tolerated treatment well Patient left: in chair;with call bell/phone within reach;with family/visitor present Nurse Communication: Mobility status PT Visit Diagnosis: Difficulty in walking, not elsewhere classified (R26.2)     Time: 8295-6213 PT Time Calculation (min) (ACUTE ONLY): 29 min  Charges:  $Gait Training: 8-22 mins $Therapeutic Exercise: 8-22 mins                    G Codes:       Chiquetta Langner 05/16/16, 5:15 PM

## 2016-05-07 LAB — BASIC METABOLIC PANEL
Anion gap: 3 — ABNORMAL LOW (ref 5–15)
BUN: 34 mg/dL — AB (ref 6–20)
CHLORIDE: 110 mmol/L (ref 101–111)
CO2: 30 mmol/L (ref 22–32)
CREATININE: 1.14 mg/dL (ref 0.61–1.24)
Calcium: 9.3 mg/dL (ref 8.9–10.3)
GFR calc non Af Amer: 55 mL/min — ABNORMAL LOW (ref >60)
Glucose, Bld: 134 mg/dL — ABNORMAL HIGH (ref 65–99)
POTASSIUM: 4.5 mmol/L (ref 3.5–5.1)
Sodium: 143 mmol/L (ref 135–145)

## 2016-05-07 LAB — CBC
HEMATOCRIT: 26.7 % — AB (ref 39.0–52.0)
HEMOGLOBIN: 9.2 g/dL — AB (ref 13.0–17.0)
MCH: 32.5 pg (ref 26.0–34.0)
MCHC: 34.5 g/dL (ref 30.0–36.0)
MCV: 94.3 fL (ref 78.0–100.0)
Platelets: 188 10*3/uL (ref 150–400)
RBC: 2.83 MIL/uL — ABNORMAL LOW (ref 4.22–5.81)
RDW: 13.1 % (ref 11.5–15.5)
WBC: 10.2 10*3/uL (ref 4.0–10.5)

## 2016-05-07 MED ORDER — TRAMADOL HCL 50 MG PO TABS: 50.0000 mg | ORAL_TABLET | Freq: Four times a day (QID) | ORAL | 0 refills | Status: DC | PRN

## 2016-05-07 MED ORDER — RIVAROXABAN 10 MG PO TABS: 10.0000 mg | ORAL_TABLET | Freq: Every day | ORAL | 0 refills | Status: DC

## 2016-05-07 MED ORDER — METHOCARBAMOL 500 MG PO TABS: 500.0000 mg | ORAL_TABLET | Freq: Four times a day (QID) | ORAL | 0 refills | Status: DC | PRN

## 2016-05-07 MED ORDER — OXYCODONE HCL 5 MG PO TABS: 5.0000 mg | ORAL_TABLET | ORAL | 0 refills | Status: DC | PRN

## 2016-05-07 NOTE — Progress Notes (Signed)
Occupational Therapy Treatment Patient Details Name: Niyam Bisping. MRN: [id number] DOB: 07/28/1924 Today's Date: 05/07/2016    History of present illness Pt s/p L THR    OT comments  All education completed this session. Pt does not need further OT   Follow Up Recommendations  No OT follow up    Equipment Recommendations  None recommended by OT    Recommendations for Other Services      Precautions / Restrictions Precautions Precautions: Fall Restrictions Weight Bearing Restrictions: No       Mobility Bed Mobility               General bed mobility comments: oob  Transfers   Equipment used: Rolling walker (2 wheeled)   Sit to Stand: Supervision         General transfer comment: cues for UE placement    Balance                                   ADL       Grooming: Oral care;Supervision/safety;Standing                   Toilet Transfer: Min guard;Ambulation;BSC;RW             General ADL Comments: pt has a long tub bench. He had assisted wife with this and feels comfortable using it.  Educated on sidestepping through tight spaces. Daughter present and did not have any further questions      Vision                     Perception     Praxis      Cognition   Behavior During Therapy: WFL for tasks assessed/performed Overall Cognitive Status: Within Functional Limits for tasks assessed                         Exercises     Shoulder Instructions       General Comments      Pertinent Vitals/ Pain       Pain Score: 2  Pain Location: L hip Pain Descriptors / Indicators: Sore Pain Intervention(s): Limited activity within patient's tolerance;Monitored during session;Repositioned;Ice applied  Home Living                                          Prior Functioning/Environment              Frequency  Min 2X/week        Progress Toward Goals  OT Goals(current goals  can now be found in the care plan section)  Progress towards OT goals: Progressing toward goals (goals not met, but education complete)     Plan      Co-evaluation                 End of Session    OT Visit Diagnosis: Unsteadiness on feet (R26.81)   Activity Tolerance Patient tolerated treatment well   Patient Left in chair   Nurse Communication          Time: 1324-4010 OT Time Calculation (min): 22 min  Charges: OT General Charges $OT Visit: 1 Procedure OT Treatments $Self Care/Home Management : 8-22 mins  Lesle Chris, OTR/L [phone] 05/07/2016   Adrien Dietzman 05/07/2016,  12:13 PM

## 2016-05-07 NOTE — Progress Notes (Signed)
   Subjective: 2 Days Post-Op Procedure(s) (LRB): LEFT TOTAL HIP ARTHROPLASTY ANTERIOR APPROACH (Left) Patient reports pain as mild.   Patient seen in rounds FOR Dr. Wynelle Link. Patient is well, but has had some minor complaints of pain in the hip and thigh, requiring pain medications Patient is ready to go to home later today  Objective: Vital signs in last 24 hours: Temp:  [97.3 F (36.3 C)-97.9 F (36.6 C)] 97.6 F (36.4 C) (03/09 0506) Pulse Rate:  [59-85] 59 (03/09 0506) Resp:  [17] 17 (03/09 0506) BP: (122-153)/(51-76) 130/58 (03/09 0506) SpO2:  [96 %-100 %] 99 % (03/09 0506)  Intake/Output from previous day:  Intake/Output Summary (Last 24 hours) at 05/07/16 0851 Last data filed at 05/07/16 0807  Gross per 24 hour  Intake             1320 ml  Output              660 ml  Net              660 ml    Intake/Output this shift: Total I/O In: 240 [P.O.:240] Out: 200 [Urine:200]  Labs:  Recent Labs  05/06/16 0428 05/07/16 0427  HGB 10.2* 9.2*    Recent Labs  05/06/16 0428 05/07/16 0427  WBC 6.5 10.2  RBC 3.25* 2.83*  HCT 30.1* 26.7*  PLT 189 188    Recent Labs  05/06/16 0428 05/07/16 0427  NA 140 143  K 4.5 4.5  CL 106 110  CO2 29 30  BUN 18 34*  CREATININE 1.04 1.14  GLUCOSE 178* 134*  CALCIUM 9.4 9.3   No results for input(s): LABPT, INR in the last 72 hours.  EXAM: General - Patient is Alert, Appropriate and Oriented Extremity - Neurovascular intact Sensation intact distally Dorsiflexion/Plantar flexion intact Incision - clean, dry, no drainage Motor Function - intact, moving foot and toes well on exam.   Assessment/Plan: 2 Days Post-Op Procedure(s) (LRB): LEFT TOTAL HIP ARTHROPLASTY ANTERIOR APPROACH (Left) Procedure(s) (LRB): LEFT TOTAL HIP ARTHROPLASTY ANTERIOR APPROACH (Left) Past Medical History:  Diagnosis Date  . Anemia   . Arthritis   . Cataract   . Complication following bilateral mastoidectomy   . Dysphagia    with large  pills  . Gout   . Wrist fracture    left   Principal Problem:   OA (osteoarthritis) of hip  Estimated body mass index is 19.91 kg/m as calculated from the following:   Height as of this encounter: 5' 7.5" (1.715 m).   Weight as of this encounter: 58.5 kg (129 lb). Up with therapy Discharge home - straight to outpatient therapy Monday. Diet - Regular diet Follow up - in 2 weeks Activity - WBAT Disposition - Home Condition Upon Discharge - Good D/C Meds - See DC Summary DVT Prophylaxis - [place], PA-C Orthopaedic Surgery 05/07/2016, 8:51 AM

## 2016-05-07 NOTE — Discharge Summary (Signed)
Physician Discharge Summary   Patient ID: [name]. MRN: [id number] DOB/AGE: 06-27-1924 83 y.o.  Admit date: 05/05/2016 Discharge date: 05/08/2015  Primary Diagnosis:  Osteoarthritis of the Left hip.  Admission Diagnoses:  Past Medical History:  Diagnosis Date  . Anemia   . Arthritis   . Cataract   . Complication following bilateral mastoidectomy   . Dysphagia    with large pills  . Gout   . Wrist fracture    left   Discharge Diagnoses:   Principal Problem:   OA (osteoarthritis) of hip  Estimated body mass index is 19.91 kg/m as calculated from the following:   Height as of this encounter: 5' 7.5" (1.715 m).   Weight as of this encounter: 58.5 kg (129 lb).  Procedure(s) (LRB): LEFT TOTAL HIP ARTHROPLASTY ANTERIOR APPROACH (Left)   Consults: None  HPI: [name]. is a 83 y.o. male who has advanced end-  stage arthritis of their Left  hip with progressively worsening pain and  dysfunction.The patient has failed nonoperative management and presents for  total hip arthroplasty.  Laboratory Data: Admission on 05/05/2016  Component Date Value Ref Range Status  . WBC 05/06/2016 6.5  4.0 - 10.5 K/uL Final  . RBC 05/06/2016 3.25* 4.22 - 5.81 MIL/uL Final  . Hemoglobin 05/06/2016 10.2* 13.0 - 17.0 g/dL Final  . HCT 05/06/2016 30.1* 39.0 - 52.0 % Final  . MCV 05/06/2016 92.6  78.0 - 100.0 fL Final  . MCH 05/06/2016 31.4  26.0 - 34.0 pg Final  . MCHC 05/06/2016 33.9  30.0 - 36.0 g/dL Final  . RDW 05/06/2016 12.7  11.5 - 15.5 % Final  . Platelets 05/06/2016 189  150 - 400 K/uL Final  . Sodium 05/06/2016 140  135 - 145 mmol/L Final  . Potassium 05/06/2016 4.5  3.5 - 5.1 mmol/L Final  . Chloride 05/06/2016 106  101 - 111 mmol/L Final  . CO2 05/06/2016 29  22 - 32 mmol/L Final  . Glucose, Bld 05/06/2016 178* 65 - 99 mg/dL Final  . BUN 05/06/2016 18  6 - 20 mg/dL Final  . Creatinine, Ser 05/06/2016 1.04  0.61 - 1.24 mg/dL Final  . Calcium 05/06/2016 9.4  8.9 - 10.3  mg/dL Final  . GFR calc non Af Amer 05/06/2016 >60  >60 mL/min Final  . GFR calc Af Amer 05/06/2016 >60  >60 mL/min Final   Comment: (NOTE) The eGFR has been calculated using the CKD EPI equation. This calculation has not been validated in all clinical situations. eGFR's persistently <60 mL/min signify possible Chronic Kidney Disease.   . Anion gap 05/06/2016 5  5 - 15 Final  . WBC 05/07/2016 10.2  4.0 - 10.5 K/uL Final  . RBC 05/07/2016 2.83* 4.22 - 5.81 MIL/uL Final  . Hemoglobin 05/07/2016 9.2* 13.0 - 17.0 g/dL Final  . HCT 05/07/2016 26.7* 39.0 - 52.0 % Final  . MCV 05/07/2016 94.3  78.0 - 100.0 fL Final  . MCH 05/07/2016 32.5  26.0 - 34.0 pg Final  . MCHC 05/07/2016 34.5  30.0 - 36.0 g/dL Final  . RDW 05/07/2016 13.1  11.5 - 15.5 % Final  . Platelets 05/07/2016 188  150 - 400 K/uL Final  . Sodium 05/07/2016 143  135 - 145 mmol/L Final  . Potassium 05/07/2016 4.5  3.5 - 5.1 mmol/L Final  . Chloride 05/07/2016 110  101 - 111 mmol/L Final  . CO2 05/07/2016 30  22 - 32 mmol/L Final  . Glucose,  Bld 05/07/2016 134* 65 - 99 mg/dL Final  . BUN 05/07/2016 34* 6 - 20 mg/dL Final  . Creatinine, Ser 05/07/2016 1.14  0.61 - 1.24 mg/dL Final  . Calcium 05/07/2016 9.3  8.9 - 10.3 mg/dL Final  . GFR calc non Af Amer 05/07/2016 55* >60 mL/min Final  . GFR calc Af Amer 05/07/2016 >60  >60 mL/min Final   Comment: (NOTE) The eGFR has been calculated using the CKD EPI equation. This calculation has not been validated in all clinical situations. eGFR's persistently <60 mL/min signify possible Chronic Kidney Disease.   Georgiann Hahn gap 05/07/2016 3* 5 - 15 Final  Hospital Outpatient Visit on 04/28/2016  Component Date Value Ref Range Status  . aPTT 04/28/2016 28  24 - 36 seconds Final  . WBC 04/28/2016 5.2  4.0 - 10.5 K/uL Final  . RBC 04/28/2016 3.71* 4.22 - 5.81 MIL/uL Final  . Hemoglobin 04/28/2016 11.9* 13.0 - 17.0 g/dL Final  . HCT 04/28/2016 35.3* 39.0 - 52.0 % Final  . MCV 04/28/2016 95.1   78.0 - 100.0 fL Final  . MCH 04/28/2016 32.1  26.0 - 34.0 pg Final  . MCHC 04/28/2016 33.7  30.0 - 36.0 g/dL Final  . RDW 04/28/2016 13.0  11.5 - 15.5 % Final  . Platelets 04/28/2016 242  150 - 400 K/uL Final  . Sodium 04/28/2016 140  135 - 145 mmol/L Final  . Potassium 04/28/2016 4.1  3.5 - 5.1 mmol/L Final  . Chloride 04/28/2016 102  101 - 111 mmol/L Final  . CO2 04/28/2016 31  22 - 32 mmol/L Final  . Glucose, Bld 04/28/2016 105* 65 - 99 mg/dL Final  . BUN 04/28/2016 21* 6 - 20 mg/dL Final  . Creatinine, Ser 04/28/2016 1.13  0.61 - 1.24 mg/dL Final  . Calcium 04/28/2016 10.0  8.9 - 10.3 mg/dL Final  . Total Protein 04/28/2016 7.1  6.5 - 8.1 g/dL Final  . Albumin 04/28/2016 4.0  3.5 - 5.0 g/dL Final  . AST 04/28/2016 27  15 - 41 U/L Final  . ALT 04/28/2016 19  17 - 63 U/L Final  . Alkaline Phosphatase 04/28/2016 58  38 - 126 U/L Final  . Total Bilirubin 04/28/2016 0.5  0.3 - 1.2 mg/dL Final  . GFR calc non Af Amer 04/28/2016 56* >60 mL/min Final  . GFR calc Af Amer 04/28/2016 >60  >60 mL/min Final   Comment: (NOTE) The eGFR has been calculated using the CKD EPI equation. This calculation has not been validated in all clinical situations. eGFR's persistently <60 mL/min signify possible Chronic Kidney Disease.   . Anion gap 04/28/2016 7  5 - 15 Final  . Prothrombin Time 04/28/2016 13.1  11.4 - 15.2 seconds Final  . INR 04/28/2016 0.99   Final  . ABO/RH(D) 04/28/2016 A POS   Final  . Antibody Screen 04/28/2016 NEG   Final  . Sample Expiration 04/28/2016 05/09/2016   Final  . Extend sample reason 04/28/2016 NO TRANSFUSIONS OR PREGNANCY IN THE PAST 3 MONTHS   Final  . MRSA, PCR 04/28/2016 NEGATIVE  NEGATIVE Final  . Staphylococcus aureus 04/28/2016 NEGATIVE  NEGATIVE Final   Comment:        The Xpert SA Assay (FDA approved for NASAL specimens in patients over 26 years of age), is one component of a comprehensive surveillance program.  Test performance has been validated by  [facility] for patients greater than or equal to 49 year old. It is not intended to diagnose infection  nor to guide or monitor treatment.   . ABO/RH(D) 04/28/2016 A POS   Final     X-Rays:Dg Pelvis Portable  Result Date: 05/05/2016 CLINICAL DATA:  Anterior approach left total hip arthroplasty. EXAM: PORTABLE PELVIS 1-2 VIEWS COMPARISON:  None. FINDINGS: Left total hip arthroplasty is present. The femoral have projects over the acetabular component. A drain is in place. The visualized pelvis is intact. Atherosclerotic changes are noted. A Foley catheter is in place. IMPRESSION: 1. Left total hip arthroplasty without radiographic evidence for complication. 2. Atherosclerosis. Electronically Signed   By: San Morelle M.D.   On: 05/05/2016 20:48   Dg C-arm 1-60 Min-no Report  Result Date: 05/05/2016 Fluoroscopy was utilized by the requesting physician.  No radiographic interpretation.    EKG: Orders placed or performed during the hospital encounter of 04/28/16  . EKG 12 lead  . EKG 12 lead     Hospital Course: Patient was admitted to [facility] and taken to the OR and underwent the above state procedure without complications.  Patient tolerated the procedure well and was later transferred to the recovery room and then to the orthopaedic floor for postoperative care.  They were given PO and IV analgesics for pain control following their surgery.  They were given 24 hours of postoperative antibiotics of  Anti-infectives    Start     Dose/Rate Route Frequency Ordered Stop   05/06/16 0600  ceFAZolin (ANCEF) IVPB 2g/100 mL premix     2 g 200 mL/hr over 30 Minutes Intravenous On call to O.R. 05/05/16 1434 05/05/16 1811   05/06/16 0000  ceFAZolin (ANCEF) IVPB 2g/100 mL premix     2 g 200 mL/hr over 30 Minutes Intravenous Every 6 hours 05/05/16 2122 05/06/16 0607     and started on DVT prophylaxis in the form of Xarelto.   PT and OT were ordered for total hip protocol.  The  patient was allowed to be WBAT with therapy. Discharge planning was consulted to help with postop disposition and equipment needs.  Patient had a good night on the evening of surgery.  They started to get up OOB with therapy on day one.  Hemovac drain was pulled without difficulty.  Continued to work with therapy into day two.  Dressing was changed on day two and the incision was healing well.   Patient was seen in rounds and was ready to go home on POD 2   Discharge home - straight to outpatient therapy Monday. Diet - Regular diet Follow up - in 2 weeks Activity - WBAT Disposition - Home Condition Upon Discharge - Good D/C Meds - See DC Summary DVT Prophylaxis - Xarelto  Discharge Instructions    Call MD / Call 911    Complete by:  As directed    If you experience chest pain or shortness of breath, CALL 911 and be transported to the hospital emergency room.  If you develope a fever above 101 F, pus (white drainage) or increased drainage or redness at the wound, or calf pain, call your surgeon's office.   Change dressing    Complete by:  As directed    You may change your dressing dressing daily with sterile 4 x 4 inch gauze dressing and paper tape.  Do not submerge the incision under water.   Constipation Prevention    Complete by:  As directed    Drink plenty of fluids.  Prune juice may be helpful.  You may use a stool softener, such  as Colace (over the counter) 100 mg twice a day.  Use MiraLax (over the counter) for constipation as needed.   Diet - low sodium heart healthy    Complete by:  As directed    Discharge instructions    Complete by:  As directed    Pick up stool softner and laxative for home use following surgery while on pain medications. Do not submerge incision under water. Please use good hand washing techniques while changing dressing each day. May shower starting three days after surgery. Please use a clean towel to pat the incision dry following showers. Continue to  use ice for pain and swelling after surgery. Do not use any lotions or creams on the incision until instructed by your surgeon.  Wear both TED hose on both legs during the day every day for three weeks, but may have off at night at home.  Postoperative Constipation Protocol  Constipation - defined medically as fewer than three stools per week and severe constipation as less than one stool per week.  One of the most common issues patients have following surgery is constipation.  Even if you have a regular bowel pattern at home, your normal regimen is likely to be disrupted due to multiple reasons following surgery.  Combination of anesthesia, postoperative narcotics, change in appetite and fluid intake all can affect your bowels.  In order to avoid complications following surgery, here are some recommendations in order to help you during your recovery period.  Colace (docusate) - Pick up an over-the-counter form of Colace or another stool softener and take twice a day as long as you are requiring postoperative pain medications.  Take with a full glass of water daily.  If you experience loose stools or diarrhea, hold the colace until you stool forms back up.  If your symptoms do not get better within 1 week or if they get worse, check with your doctor.  Dulcolax (bisacodyl) - Pick up over-the-counter and take as directed by the product packaging as needed to assist with the movement of your bowels.  Take with a full glass of water.  Use this product as needed if not relieved by Colace only.   MiraLax (polyethylene glycol) - Pick up over-the-counter to have on hand.  MiraLax is a solution that will increase the amount of water in your bowels to assist with bowel movements.  Take as directed and can mix with a glass of water, juice, soda, coffee, or tea.  Take if you go more than two days without a movement. Do not use MiraLax more than once per day. Call your doctor if you are still constipated or  irregular after using this medication for 7 days in a row.  If you continue to have problems with postoperative constipation, please contact the office for further assistance and recommendations.  If you experience "the worst abdominal pain ever" or develop nausea or vomiting, please contact the office immediatly for further recommendations for treatment.   Take Xarelto for two and a half more weeks, then discontinue Xarelto. Once the patient has completed the Xarelto, they may resume the 81 mg Aspirin.   Do not sit on low chairs, stoools or toilet seats, as it may be difficult to get up from low surfaces    Complete by:  As directed    Driving restrictions    Complete by:  As directed    No driving until released by the physician.   Increase activity slowly as tolerated  Complete by:  As directed    Lifting restrictions    Complete by:  As directed    No lifting until released by the physician.   Patient may shower    Complete by:  As directed    You may shower without a dressing once there is no drainage.  Do not wash over the wound.  If drainage remains, do not shower until drainage stops.   TED hose    Complete by:  As directed    Use stockings (TED hose) for 3 weeks on both leg(s).  You may remove them at night for sleeping.   Weight bearing as tolerated    Complete by:  As directed    Laterality:  left   Extremity:  Lower     Allergies as of 05/07/2016   No Known Allergies     Medication List    STOP taking these medications   aspirin 81 MG tablet   B-12 2500 MCG Tabs   BENEFIBER DRINK MIX PO   CALCIUM + D PO   Fish Oil Oil   Vitamin D 2000 units tablet     TAKE these medications   acetaminophen 500 MG tablet Commonly known as:  TYLENOL Take 500-1,000 mg by mouth 2 (two) times daily as needed for mild pain.   chlorthalidone 25 MG tablet Commonly known as:  HYGROTON Take 12.5 mg by mouth daily.   finasteride 5 MG tablet Commonly known as:  PROSCAR Take  5 mg by mouth daily.   methocarbamol 500 MG tablet Commonly known as:  ROBAXIN Take 1 tablet (500 mg total) by mouth every 6 (six) hours as needed for muscle spasms.   NON FORMULARY Take 1 tablet by mouth daily. Vision Vite - 1 tablet daily   oxyCODONE 5 MG immediate release tablet Commonly known as:  Oxy IR/ROXICODONE Take 1-2 tablets (5-10 mg total) by mouth every 4 (four) hours as needed for moderate pain or severe pain.   polyethylene glycol packet Commonly known as:  MIRALAX / GLYCOLAX Take 17 g by mouth daily as needed for mild constipation.   rivaroxaban 10 MG Tabs tablet Commonly known as:  XARELTO Take 1 tablet (10 mg total) by mouth daily with breakfast. Take Xarelto for two and a half more weeks following discharge from the hospital, then discontinue Xarelto. Once the patient has completed the Xarelto, they may resume the 81 mg Aspirin. Start taking on:  05/08/2016   rosuvastatin 5 MG tablet Commonly known as:  CRESTOR Take 5 mg by mouth daily.   traMADol 50 MG tablet Commonly known as:  ULTRAM Take 1-2 tablets (50-100 mg total) by mouth every 6 (six) hours as needed for moderate pain.      Follow-up Information    Gearlean Alf, MD. Schedule an appointment as soon as possible for a visit on 05/18/2016.   Specialty:  Orthopedic Surgery Contact information: [address] [place] [address] [phone]           Signed: Arlee Muslim, PA-C Orthopaedic Surgery 05/07/2016, 8:57 AM

## 2016-05-07 NOTE — Progress Notes (Signed)
Physical Therapy Treatment Patient Details Name: [name]. MRN: [id number] DOB: 1924-08-20 Today's Date: 05/07/2016    History of Present Illness Pt s/p L THR     PT Comments    Pt progressing well.  Will follow up when dtr is present with home therex program and car transfers.   Follow Up Recommendations  Outpatient PT     Equipment Recommendations  None recommended by PT    Recommendations for Other Services OT consult     Precautions / Restrictions Precautions Precautions: Fall Restrictions Weight Bearing Restrictions: No    Mobility  Bed Mobility Overal bed mobility: Needs Assistance Bed Mobility: Supine to Sit;Sit to Supine     Supine to sit: Min guard Sit to supine: Min guard   General bed mobility comments: oob  Transfers Overall transfer level: Needs assistance Equipment used: Rolling walker (2 wheeled) Transfers: Sit to/from Stand Sit to Stand: Supervision         General transfer comment: cues for UE placement  Ambulation/Gait Ambulation/Gait assistance: Min guard;Supervision Ambulation Distance (Feet): 200 Feet Assistive device: Rolling walker (2 wheeled) Gait Pattern/deviations: Step-to pattern;Step-through pattern;Decreased step length - right;Decreased step length - left;Shuffle;Trunk flexed Gait velocity: decr Gait velocity interpretation: Below normal speed for age/gender General Gait Details: cues for posture, position from RW and initial sequence   Stairs            Wheelchair Mobility    Modified Rankin (Stroke Patients Only)       Balance                                    Cognition Arousal/Alertness: Awake/alert Behavior During Therapy: WFL for tasks assessed/performed Overall Cognitive Status: Within Functional Limits for tasks assessed                      Exercises Total Joint Exercises Ankle Circles/Pumps: AROM;Both;15 reps;Supine Quad Sets: AROM;Both;10 reps;Supine Heel  Slides: AAROM;Left;20 reps;Supine Hip ABduction/ADduction: AAROM;Left;15 reps;Supine Long Arc Quad: 10 reps;Seated;Left;AROM    General Comments        Pertinent Vitals/Pain Pain Assessment: 0-10 Pain Score: 2  Pain Location: L hip Pain Descriptors / Indicators: Sore Pain Intervention(s): Limited activity within patient's tolerance;Monitored during session;Repositioned;Ice applied    Home Living                      Prior Function            PT Goals (current goals can now be found in the care plan section) Acute Rehab PT Goals Patient Stated Goal: home - maybe play tennis PT Goal Formulation: With patient Time For Goal Achievement: 05/09/16 Potential to Achieve Goals: Good Progress towards PT goals: Progressing toward goals    Frequency    7X/week      PT Plan Current plan remains appropriate    Co-evaluation             End of Session Equipment Utilized During Treatment: Gait belt Activity Tolerance: Patient tolerated treatment well Patient left: in chair;with call bell/phone within reach;with family/visitor present Nurse Communication: Mobility status PT Visit Diagnosis: Difficulty in walking, not elsewhere classified (R26.2)     Time: 4765-4650 PT Time Calculation (min) (ACUTE ONLY): 30 min  Charges:  $Gait Training: 8-22 mins $Therapeutic Exercise: 8-22 mins  G Codes:       Lysandra Loughmiller 01-Jun-2016, 12:25 PM

## 2016-05-07 NOTE — Progress Notes (Signed)
Physical Therapy Treatment Patient Details Name: [name]. MRN: [id number] DOB: December 19, 1924 Today's Date: 05/07/2016    History of Present Illness Pt s/p L THR     PT Comments    Pt progressing well and eager for return home.  Pt's dtr present to review home therex and car transfers.   Follow Up Recommendations  Outpatient PT     Equipment Recommendations  None recommended by PT    Recommendations for Other Services OT consult     Precautions / Restrictions Precautions Precautions: Fall Restrictions Weight Bearing Restrictions: No    Mobility  Bed Mobility Overal bed mobility: Needs Assistance Bed Mobility: Supine to Sit;Sit to Supine     Supine to sit: Min guard Sit to supine: Min guard   General bed mobility comments: oob  Transfers Overall transfer level: Needs assistance Equipment used: Rolling walker (2 wheeled) Transfers: Sit to/from Stand Sit to Stand: Supervision         General transfer comment: cues for UE placement  Ambulation/Gait Ambulation/Gait assistance: Supervision Ambulation Distance (Feet): 10 Feet Assistive device: Rolling walker (2 wheeled) Gait Pattern/deviations: Step-to pattern;Step-through pattern;Decreased step length - right;Decreased step length - left;Shuffle;Trunk flexed Gait velocity: decr Gait velocity interpretation: Below normal speed for age/gender General Gait Details: cues for posture, position from RW and initial sequence   Stairs            Wheelchair Mobility    Modified Rankin (Stroke Patients Only)       Balance                                    Cognition Arousal/Alertness: Awake/alert Behavior During Therapy: WFL for tasks assessed/performed Overall Cognitive Status: Within Functional Limits for tasks assessed                      Exercises Total Joint Exercises Ankle Circles/Pumps: AROM;Both;15 reps;Supine Quad Sets: AROM;Both;10 reps;Supine Heel Slides:  AAROM;Left;20 reps;Supine Hip ABduction/ADduction: AAROM;Left;15 reps;Supine Long Arc Quad: 10 reps;Seated;Left;AROM    General Comments        Pertinent Vitals/Pain Pain Assessment: 0-10 Pain Score: 2  Pain Location: L hip Pain Descriptors / Indicators: Sore Pain Intervention(s): Limited activity within patient's tolerance;Monitored during session;Repositioned;Ice applied    Home Living                      Prior Function            PT Goals (current goals can now be found in the care plan section) Acute Rehab PT Goals Patient Stated Goal: home - maybe play tennis PT Goal Formulation: With patient Time For Goal Achievement: 05/09/16 Potential to Achieve Goals: Good Progress towards PT goals: Progressing toward goals    Frequency    7X/week      PT Plan Current plan remains appropriate    Co-evaluation             End of Session Equipment Utilized During Treatment: Gait belt Activity Tolerance: Patient tolerated treatment well Patient left: in chair;with call bell/phone within reach;with family/visitor present Nurse Communication: Mobility status PT Visit Diagnosis: Difficulty in walking, not elsewhere classified (R26.2)     Time: 4431-5400 PT Time Calculation (min) (ACUTE ONLY): 38 min  Charges:  $Gait Training: 8-22 mins $Therapeutic Exercise: 23-37 mins $Therapeutic Activity: 8-22 mins  G Codes:       Khushboo Chuck 2016-05-20, 12:29 PM

## 2016-05-07 NOTE — Progress Notes (Signed)
D/C instructions reviewed w/ pt and dtr. Both verbalize understanding and all questions answered. Pt in stable condition, in possession of d/c instructions, scripts, and all personal belongings. Pt and dtr awaiting ride who they state is en route.

## 2016-05-11 DIAGNOSIS — M25552 Pain in left hip: Secondary | ICD-10-CM | POA: Diagnosis not present

## 2016-05-14 DIAGNOSIS — M25552 Pain in left hip: Secondary | ICD-10-CM | POA: Diagnosis not present

## 2016-05-18 DIAGNOSIS — Z96642 Presence of left artificial hip joint: Secondary | ICD-10-CM | POA: Diagnosis not present

## 2016-05-18 DIAGNOSIS — Z471 Aftercare following joint replacement surgery: Secondary | ICD-10-CM | POA: Diagnosis not present

## 2016-05-19 DIAGNOSIS — M25552 Pain in left hip: Secondary | ICD-10-CM | POA: Diagnosis not present

## 2016-05-21 DIAGNOSIS — M25552 Pain in left hip: Secondary | ICD-10-CM | POA: Diagnosis not present

## 2016-05-24 DIAGNOSIS — M25552 Pain in left hip: Secondary | ICD-10-CM | POA: Diagnosis not present

## 2016-05-26 DIAGNOSIS — M25552 Pain in left hip: Secondary | ICD-10-CM | POA: Diagnosis not present

## 2016-05-31 DIAGNOSIS — M25552 Pain in left hip: Secondary | ICD-10-CM | POA: Diagnosis not present

## 2016-06-02 DIAGNOSIS — M25552 Pain in left hip: Secondary | ICD-10-CM | POA: Diagnosis not present

## 2016-06-07 DIAGNOSIS — M25552 Pain in left hip: Secondary | ICD-10-CM | POA: Diagnosis not present

## 2016-06-09 DIAGNOSIS — M25552 Pain in left hip: Secondary | ICD-10-CM | POA: Diagnosis not present

## 2016-06-15 DIAGNOSIS — Z471 Aftercare following joint replacement surgery: Secondary | ICD-10-CM | POA: Diagnosis not present

## 2016-06-15 DIAGNOSIS — Z96642 Presence of left artificial hip joint: Secondary | ICD-10-CM | POA: Diagnosis not present

## 2016-06-25 DIAGNOSIS — H35362 Drusen (degenerative) of macula, left eye: Secondary | ICD-10-CM | POA: Diagnosis not present

## 2016-06-29 DIAGNOSIS — R351 Nocturia: Secondary | ICD-10-CM | POA: Diagnosis not present

## 2016-06-29 DIAGNOSIS — N401 Enlarged prostate with lower urinary tract symptoms: Secondary | ICD-10-CM | POA: Diagnosis not present

## 2016-07-20 DIAGNOSIS — Z471 Aftercare following joint replacement surgery: Secondary | ICD-10-CM | POA: Diagnosis not present

## 2016-07-20 DIAGNOSIS — Z96642 Presence of left artificial hip joint: Secondary | ICD-10-CM | POA: Diagnosis not present

## 2016-07-20 DIAGNOSIS — M1612 Unilateral primary osteoarthritis, left hip: Secondary | ICD-10-CM | POA: Diagnosis not present

## 2016-08-30 DIAGNOSIS — H353122 Nonexudative age-related macular degeneration, left eye, intermediate dry stage: Secondary | ICD-10-CM | POA: Diagnosis not present

## 2016-08-30 DIAGNOSIS — H353112 Nonexudative age-related macular degeneration, right eye, intermediate dry stage: Secondary | ICD-10-CM | POA: Diagnosis not present

## 2016-08-30 DIAGNOSIS — H35371 Puckering of macula, right eye: Secondary | ICD-10-CM | POA: Diagnosis not present

## 2016-08-30 DIAGNOSIS — H43811 Vitreous degeneration, right eye: Secondary | ICD-10-CM | POA: Diagnosis not present

## 2016-09-15 DIAGNOSIS — L821 Other seborrheic keratosis: Secondary | ICD-10-CM | POA: Diagnosis not present

## 2016-09-15 DIAGNOSIS — D2272 Melanocytic nevi of left lower limb, including hip: Secondary | ICD-10-CM | POA: Diagnosis not present

## 2016-09-15 DIAGNOSIS — L814 Other melanin hyperpigmentation: Secondary | ICD-10-CM | POA: Diagnosis not present

## 2016-09-15 DIAGNOSIS — D2271 Melanocytic nevi of right lower limb, including hip: Secondary | ICD-10-CM | POA: Diagnosis not present

## 2016-09-15 DIAGNOSIS — Z85828 Personal history of other malignant neoplasm of skin: Secondary | ICD-10-CM | POA: Diagnosis not present

## 2016-09-15 DIAGNOSIS — L570 Actinic keratosis: Secondary | ICD-10-CM | POA: Diagnosis not present

## 2016-09-15 DIAGNOSIS — D692 Other nonthrombocytopenic purpura: Secondary | ICD-10-CM | POA: Diagnosis not present

## 2016-09-15 DIAGNOSIS — D2261 Melanocytic nevi of right upper limb, including shoulder: Secondary | ICD-10-CM | POA: Diagnosis not present

## 2016-09-15 DIAGNOSIS — D1801 Hemangioma of skin and subcutaneous tissue: Secondary | ICD-10-CM | POA: Diagnosis not present

## 2016-09-15 DIAGNOSIS — D2262 Melanocytic nevi of left upper limb, including shoulder: Secondary | ICD-10-CM | POA: Diagnosis not present

## 2016-09-15 DIAGNOSIS — D225 Melanocytic nevi of trunk: Secondary | ICD-10-CM | POA: Diagnosis not present

## 2016-09-16 DIAGNOSIS — H6062 Unspecified chronic otitis externa, left ear: Secondary | ICD-10-CM | POA: Diagnosis not present

## 2016-09-16 DIAGNOSIS — H6122 Impacted cerumen, left ear: Secondary | ICD-10-CM | POA: Diagnosis not present

## 2016-09-29 ENCOUNTER — Other Ambulatory Visit (HOSPITAL_BASED_OUTPATIENT_CLINIC_OR_DEPARTMENT_OTHER): Payer: Medicare Other

## 2016-09-29 DIAGNOSIS — D472 Monoclonal gammopathy: Secondary | ICD-10-CM | POA: Diagnosis not present

## 2016-09-29 LAB — CBC WITH DIFFERENTIAL/PLATELET
BASO%: 0.4 % (ref 0.0–2.0)
Basophils Absolute: 0 10*3/uL (ref 0.0–0.1)
EOS%: 5.9 % (ref 0.0–7.0)
Eosinophils Absolute: 0.3 10*3/uL (ref 0.0–0.5)
HEMATOCRIT: 35.5 % — AB (ref 38.4–49.9)
HGB: 11.9 g/dL — ABNORMAL LOW (ref 13.0–17.1)
LYMPH#: 1.2 10*3/uL (ref 0.9–3.3)
LYMPH%: 25.3 % (ref 14.0–49.0)
MCH: 32.2 pg (ref 27.2–33.4)
MCHC: 33.5 g/dL (ref 32.0–36.0)
MCV: 96.2 fL (ref 79.3–98.0)
MONO#: 0.3 10*3/uL (ref 0.1–0.9)
MONO%: 7.2 % (ref 0.0–14.0)
NEUT%: 61.2 % (ref 39.0–75.0)
NEUTROS ABS: 2.8 10*3/uL (ref 1.5–6.5)
PLATELETS: 175 10*3/uL (ref 140–400)
RBC: 3.69 10*6/uL — AB (ref 4.20–5.82)
RDW: 13.4 % (ref 11.0–14.6)
WBC: 4.6 10*3/uL (ref 4.0–10.3)

## 2016-09-29 LAB — COMPREHENSIVE METABOLIC PANEL
ALT: 18 U/L (ref 0–55)
AST: 23 U/L (ref 5–34)
Albumin: 3.7 g/dL (ref 3.5–5.0)
Alkaline Phosphatase: 64 U/L (ref 40–150)
Anion Gap: 7 mEq/L (ref 3–11)
BILIRUBIN TOTAL: 0.44 mg/dL (ref 0.20–1.20)
BUN: 24.2 mg/dL (ref 7.0–26.0)
CHLORIDE: 106 meq/L (ref 98–109)
CO2: 31 meq/L — AB (ref 22–29)
CREATININE: 1.3 mg/dL (ref 0.7–1.3)
Calcium: 10.4 mg/dL (ref 8.4–10.4)
EGFR: 46 mL/min/{1.73_m2} — ABNORMAL LOW (ref >90)
GLUCOSE: 105 mg/dL (ref 70–140)
Potassium: 4 mEq/L (ref 3.5–5.1)
SODIUM: 144 meq/L (ref 136–145)
TOTAL PROTEIN: 6.8 g/dL (ref 6.4–8.3)

## 2016-09-30 LAB — KAPPA/LAMBDA LIGHT CHAINS
IG LAMBDA FREE LIGHT CHAIN: 22.7 mg/L (ref 5.7–26.3)
Ig Kappa Free Light Chain: 33 mg/L — ABNORMAL HIGH (ref 3.3–19.4)
KAPPA/LAMBDA FLC RATIO: 1.45 (ref 0.26–1.65)

## 2016-10-02 LAB — MULTIPLE MYELOMA PANEL, SERUM
ALBUMIN SERPL ELPH-MCNC: 3.6 g/dL (ref 2.9–4.4)
ALPHA2 GLOB SERPL ELPH-MCNC: 0.6 g/dL (ref 0.4–1.0)
Albumin/Glob SerPl: 1.4 (ref 0.7–1.7)
Alpha 1: 0.2 g/dL (ref 0.0–0.4)
B-GLOBULIN SERPL ELPH-MCNC: 0.8 g/dL (ref 0.7–1.3)
Gamma Glob SerPl Elph-Mcnc: 1.1 g/dL (ref 0.4–1.8)
Globulin, Total: 2.7 g/dL (ref 2.2–3.9)
IGM (IMMUNOGLOBIN M), SRM: 54 mg/dL (ref 15–143)
IgA, Qn, Serum: 96 mg/dL (ref 61–437)
M PROTEIN SERPL ELPH-MCNC: 0.7 g/dL — AB
TOTAL PROTEIN: 6.3 g/dL (ref 6.0–8.5)

## 2016-10-05 DIAGNOSIS — Z125 Encounter for screening for malignant neoplasm of prostate: Secondary | ICD-10-CM | POA: Diagnosis not present

## 2016-10-05 DIAGNOSIS — R7301 Impaired fasting glucose: Secondary | ICD-10-CM | POA: Diagnosis not present

## 2016-10-05 DIAGNOSIS — E784 Other hyperlipidemia: Secondary | ICD-10-CM | POA: Diagnosis not present

## 2016-10-05 DIAGNOSIS — M859 Disorder of bone density and structure, unspecified: Secondary | ICD-10-CM | POA: Diagnosis not present

## 2016-10-05 DIAGNOSIS — N183 Chronic kidney disease, stage 3 (moderate): Secondary | ICD-10-CM | POA: Diagnosis not present

## 2016-10-06 ENCOUNTER — Ambulatory Visit (HOSPITAL_BASED_OUTPATIENT_CLINIC_OR_DEPARTMENT_OTHER): Payer: Medicare Other | Admitting: Oncology

## 2016-10-06 VITALS — BP 138/65 | HR 69 | Temp 97.4°F | Resp 18 | Ht 67.5 in | Wt 136.2 lb

## 2016-10-06 DIAGNOSIS — D472 Monoclonal gammopathy: Secondary | ICD-10-CM

## 2016-10-06 DIAGNOSIS — D649 Anemia, unspecified: Secondary | ICD-10-CM

## 2016-10-06 NOTE — Progress Notes (Signed)
[facility] OFFICE PROGRESS NOTE 10/06/16  Donald Infante, MD [place] [address]  DIAGNOSIS: 83 year old gentleman with IgG kappa MGUS diagnosed in 2009. He has been on active surveillance since that time without any evidence of multiple myeloma. His M spike had been around 0.6 g/dL and have not changed.   CURRENT TREATMENT:  Observation.  INTERVAL HISTORY: [name] presents today for a follow-up visit. Since his last visit, he underwent left hip replacement surgery on March 2018. He tolerated the procedure well and currently fully recovered. He is ambulating without any difficulties or pain. He continues to attend to her activities of daily living. He is not able to drive because of his vision. He has not reported any peripheral neuropathy, opportunistic infections, pathological fractures or constitutional symptoms. His performance status and quality of life remains excellent.  He does not report any headaches, blurry vision, syncope or seizures. He does not report any fevers, chills, sweats or weight loss. He does not report any chest pain, palpitation, orthopnea or leg edema. He does not report any nausea, vomiting or abdominal pain. He does not report any frequency urgency or hesitancy. He does not report any skeletal complaints. Remaining review of systems unremarkable.  MEDICAL HISTORY: Past Medical History:  Diagnosis Date  . Anemia   . Arthritis   . Cataract   . Complication following bilateral mastoidectomy   . Dysphagia    with large pills  . Gout   . Wrist fracture    left     ALLERGIES:  has No Known Allergies.  MEDICATIONS:   Current Outpatient Prescriptions  Medication Sig Dispense Refill  . acetaminophen (TYLENOL) 500 MG tablet Take 500-1,000 mg by mouth 2 (two) times daily as needed for mild pain.    . chlorthalidone (HYGROTON) 25 MG tablet Take 12.5 mg by mouth daily.    . finasteride (PROSCAR) 5 MG tablet Take 5 mg by mouth  daily.    . methocarbamol (ROBAXIN) 500 MG tablet Take 1 tablet (500 mg total) by mouth every 6 (six) hours as needed for muscle spasms. 80 tablet 0  . NON FORMULARY Take 1 tablet by mouth daily. Vision Vite - 1 tablet daily      . oxyCODONE (OXY IR/ROXICODONE) 5 MG immediate release tablet Take 1-2 tablets (5-10 mg total) by mouth every 4 (four) hours as needed for moderate pain or severe pain. 84 tablet 0  . polyethylene glycol (MIRALAX / GLYCOLAX) packet Take 17 g by mouth daily as needed for mild constipation.     . rivaroxaban (XARELTO) 10 MG TABS tablet Take 1 tablet (10 mg total) by mouth daily with breakfast. Take Xarelto for two and a half more weeks following discharge from the hospital, then discontinue Xarelto. Once the patient has completed the Xarelto, they may resume the 81 mg Aspirin. 19 tablet 0  . rosuvastatin (CRESTOR) 5 MG tablet Take 5 mg by mouth daily.    . traMADol (ULTRAM) 50 MG tablet Take 1-2 tablets (50-100 mg total) by mouth every 6 (six) hours as needed for moderate pain. 56 tablet 0   No current facility-administered medications for this visit.     SURGICAL HISTORY:  Past Surgical History:  Procedure Laterality Date  . cataracts    . TOTAL HIP ARTHROPLASTY Left 05/05/2016   Procedure: LEFT TOTAL HIP ARTHROPLASTY ANTERIOR APPROACH;  Surgeon: Gaynelle Arabian, MD;  Location: [facility];  Service: Orthopedics;  Laterality: Left;     PHYSICAL EXAMINATION: ECOG  PERFORMANCE STATUS: 0 - Asymptomatic  Blood pressure 138/65, pulse 69, temperature (!) 97.4 F (36.3 C), temperature source Oral, resp. rate 18, height 5' 7.5" (1.715 m), weight 136 lb 3.2 oz (61.8 kg), SpO2 98 %.   GENERAL:Alert, awake gentleman without distress. SKIN: No rashes or lesions. EYES: normal, Conjunctiva are pink and non-injected, sclera clear OROPHARYNX:no exudate, no oral ulcers or lesions. NECK: supple, thyroid normal size, no lymphadenopathy. LYMPH:  no palpable lymphadenopathy in the  cervical, axillary or supraclavicular LUNGS: clear to auscultation with normal breathing effort, no wheezes or rhonchi HEART: regular rate & rhythm and no murmur.  ABDOMEN:abdomen soft, non-tender and normal bowel sounds no rebound or guarding. Musculoskeletal:no cyanosis of digits and no clubbing  NEURO: No deficits noted.  Labs:  Lab Results  Component Value Date   WBC 4.6 09/29/2016   HGB 11.9 (L) 09/29/2016   HCT 35.5 (L) 09/29/2016   MCV 96.2 09/29/2016   PLT 175 09/29/2016   NEUTROABS 2.8 09/29/2016      Chemistry      Component Value Date/Time   NA 144 09/29/2016 0801   K 4.0 09/29/2016 0801   CL 110 05/07/2016 0427   CL 104 08/02/2012 0836   CO2 31 (H) 09/29/2016 0801   BUN 24.2 09/29/2016 0801   CREATININE 1.3 09/29/2016 0801      Component Value Date/Time   CALCIUM 10.4 09/29/2016 0801   ALKPHOS 64 09/29/2016 0801   AST 23 09/29/2016 0801   ALT 18 09/29/2016 0801   BILITOT 0.44 09/29/2016 0801       Results for Ramsey, [name]. (MRN [id number]) as of 10/06/2016 10:30  Ref. Range 10/01/2015 10:54 09/29/2016 08:01  M Protein SerPl Elph-Mcnc Latest Ref Range: Not Observed g/dL 0.6 (H) 0.7 (H)   Results for [name], [name] (MRN [id number]) as of 10/06/2016 10:30  Ref. Range 10/01/2015 10:54 09/29/2016 08:01  IgG (Immunoglobin G), Serum Latest Ref Range: 700 - 1600 mg/dL 1,049 1,123   Results for [name], [name] (MRN [id number]) as of 10/06/2016 10:30  Ref. Range 09/29/2016 08:01  Ig Kappa Free Light Chain Latest Ref Range: 3.3 - 19.4 mg/L 33.0 (H)  Ig Lambda Free Light Chain Latest Ref Range: 5.7 - 26.3 mg/L 22.7  Kappa/Lambda FluidC Ratio Latest Ref Range: 0.26 - 1.65  1.45    Assessment and plan:   83 year old gentleman with the following issues:  1. IgG Kappa MGUS. This was diagnosed at least since 2009. He presented with normal IgG level and an M spike of 0.6 g/dL.  Laboratory data obtained on 09/29/2016 were reviewed today and continues to show stable  findings. His M spike Remains about the same since 2009. He has no evidence of end organ damage with his kappa to lambda ratio within normal range. He has mild anemia which is recently exacerbated by his hip surgery and not a sign of a plasma cell disorder.  These findings suggest very low likelihood of converting to active multiple myeloma.  I have recommended routine laboratory testing as part of his annual physical examination and only to repeat protein studies as needed.   2. Follow up: I'm happy to see him in the future as needed.   [facility], MD 10/06/16 10:40 AM

## 2016-10-12 DIAGNOSIS — Z0000 Encounter for general adult medical examination without abnormal findings: Secondary | ICD-10-CM | POA: Diagnosis not present

## 2016-10-12 DIAGNOSIS — Z1389 Encounter for screening for other disorder: Secondary | ICD-10-CM | POA: Diagnosis not present

## 2016-10-12 DIAGNOSIS — N401 Enlarged prostate with lower urinary tract symptoms: Secondary | ICD-10-CM | POA: Diagnosis not present

## 2016-10-12 DIAGNOSIS — N183 Chronic kidney disease, stage 3 (moderate): Secondary | ICD-10-CM | POA: Diagnosis not present

## 2016-10-12 DIAGNOSIS — D6489 Other specified anemias: Secondary | ICD-10-CM | POA: Diagnosis not present

## 2016-10-12 DIAGNOSIS — M25552 Pain in left hip: Secondary | ICD-10-CM | POA: Diagnosis not present

## 2016-10-12 DIAGNOSIS — L988 Other specified disorders of the skin and subcutaneous tissue: Secondary | ICD-10-CM | POA: Diagnosis not present

## 2016-10-12 DIAGNOSIS — E784 Other hyperlipidemia: Secondary | ICD-10-CM | POA: Diagnosis not present

## 2016-10-12 DIAGNOSIS — M48061 Spinal stenosis, lumbar region without neurogenic claudication: Secondary | ICD-10-CM | POA: Diagnosis not present

## 2016-10-12 DIAGNOSIS — M5416 Radiculopathy, lumbar region: Secondary | ICD-10-CM | POA: Diagnosis not present

## 2016-10-12 DIAGNOSIS — R42 Dizziness and giddiness: Secondary | ICD-10-CM | POA: Diagnosis not present

## 2016-10-12 DIAGNOSIS — Z6821 Body mass index (BMI) 21.0-21.9, adult: Secondary | ICD-10-CM | POA: Diagnosis not present

## 2016-10-15 DIAGNOSIS — Z1212 Encounter for screening for malignant neoplasm of rectum: Secondary | ICD-10-CM | POA: Diagnosis not present

## 2016-12-10 DIAGNOSIS — Z23 Encounter for immunization: Secondary | ICD-10-CM | POA: Diagnosis not present

## 2017-03-17 DIAGNOSIS — H6121 Impacted cerumen, right ear: Secondary | ICD-10-CM | POA: Diagnosis not present

## 2017-03-17 DIAGNOSIS — H903 Sensorineural hearing loss, bilateral: Secondary | ICD-10-CM | POA: Diagnosis not present

## 2017-04-04 DIAGNOSIS — R946 Abnormal results of thyroid function studies: Secondary | ICD-10-CM | POA: Diagnosis not present

## 2017-04-11 DIAGNOSIS — Z6820 Body mass index (BMI) 20.0-20.9, adult: Secondary | ICD-10-CM | POA: Diagnosis not present

## 2017-04-11 DIAGNOSIS — I10 Essential (primary) hypertension: Secondary | ICD-10-CM | POA: Diagnosis not present

## 2017-04-11 DIAGNOSIS — K5909 Other constipation: Secondary | ICD-10-CM | POA: Diagnosis not present

## 2017-04-11 DIAGNOSIS — R946 Abnormal results of thyroid function studies: Secondary | ICD-10-CM | POA: Diagnosis not present

## 2017-04-11 DIAGNOSIS — N401 Enlarged prostate with lower urinary tract symptoms: Secondary | ICD-10-CM | POA: Diagnosis not present

## 2017-04-11 DIAGNOSIS — R7301 Impaired fasting glucose: Secondary | ICD-10-CM | POA: Diagnosis not present

## 2017-04-28 DIAGNOSIS — Z471 Aftercare following joint replacement surgery: Secondary | ICD-10-CM | POA: Diagnosis not present

## 2017-04-28 DIAGNOSIS — M1612 Unilateral primary osteoarthritis, left hip: Secondary | ICD-10-CM | POA: Diagnosis not present

## 2017-04-28 DIAGNOSIS — Z96649 Presence of unspecified artificial hip joint: Secondary | ICD-10-CM | POA: Diagnosis not present

## 2017-04-28 DIAGNOSIS — Z96642 Presence of left artificial hip joint: Secondary | ICD-10-CM | POA: Diagnosis not present

## 2017-05-19 DIAGNOSIS — Z6821 Body mass index (BMI) 21.0-21.9, adult: Secondary | ICD-10-CM | POA: Diagnosis not present

## 2017-05-19 DIAGNOSIS — I10 Essential (primary) hypertension: Secondary | ICD-10-CM | POA: Diagnosis not present

## 2017-05-27 DIAGNOSIS — I10 Essential (primary) hypertension: Secondary | ICD-10-CM | POA: Diagnosis not present

## 2017-07-07 DIAGNOSIS — N401 Enlarged prostate with lower urinary tract symptoms: Secondary | ICD-10-CM | POA: Diagnosis not present

## 2017-07-07 DIAGNOSIS — R351 Nocturia: Secondary | ICD-10-CM | POA: Diagnosis not present

## 2017-07-13 DIAGNOSIS — H6121 Impacted cerumen, right ear: Secondary | ICD-10-CM | POA: Diagnosis not present

## 2017-07-13 DIAGNOSIS — J301 Allergic rhinitis due to pollen: Secondary | ICD-10-CM | POA: Diagnosis not present

## 2017-07-13 DIAGNOSIS — H903 Sensorineural hearing loss, bilateral: Secondary | ICD-10-CM | POA: Diagnosis not present

## 2017-08-15 DIAGNOSIS — R05 Cough: Secondary | ICD-10-CM | POA: Diagnosis not present

## 2017-08-15 DIAGNOSIS — J3089 Other allergic rhinitis: Secondary | ICD-10-CM | POA: Diagnosis not present

## 2017-08-15 DIAGNOSIS — J45998 Other asthma: Secondary | ICD-10-CM | POA: Diagnosis not present

## 2017-08-15 DIAGNOSIS — I10 Essential (primary) hypertension: Secondary | ICD-10-CM | POA: Diagnosis not present

## 2017-08-15 DIAGNOSIS — Z6820 Body mass index (BMI) 20.0-20.9, adult: Secondary | ICD-10-CM | POA: Diagnosis not present

## 2017-08-30 DIAGNOSIS — H43811 Vitreous degeneration, right eye: Secondary | ICD-10-CM | POA: Diagnosis not present

## 2017-08-30 DIAGNOSIS — H353112 Nonexudative age-related macular degeneration, right eye, intermediate dry stage: Secondary | ICD-10-CM | POA: Diagnosis not present

## 2017-08-30 DIAGNOSIS — H353122 Nonexudative age-related macular degeneration, left eye, intermediate dry stage: Secondary | ICD-10-CM | POA: Diagnosis not present

## 2017-08-30 DIAGNOSIS — H35371 Puckering of macula, right eye: Secondary | ICD-10-CM | POA: Diagnosis not present

## 2017-09-15 DIAGNOSIS — H35362 Drusen (degenerative) of macula, left eye: Secondary | ICD-10-CM | POA: Diagnosis not present

## 2017-09-21 DIAGNOSIS — Z85828 Personal history of other malignant neoplasm of skin: Secondary | ICD-10-CM | POA: Diagnosis not present

## 2017-09-21 DIAGNOSIS — C44712 Basal cell carcinoma of skin of right lower limb, including hip: Secondary | ICD-10-CM | POA: Diagnosis not present

## 2017-09-21 DIAGNOSIS — L821 Other seborrheic keratosis: Secondary | ICD-10-CM | POA: Diagnosis not present

## 2017-09-21 DIAGNOSIS — D1801 Hemangioma of skin and subcutaneous tissue: Secondary | ICD-10-CM | POA: Diagnosis not present

## 2017-09-21 DIAGNOSIS — L565 Disseminated superficial actinic porokeratosis (DSAP): Secondary | ICD-10-CM | POA: Diagnosis not present

## 2017-09-21 DIAGNOSIS — D225 Melanocytic nevi of trunk: Secondary | ICD-10-CM | POA: Diagnosis not present

## 2017-11-09 DIAGNOSIS — H6122 Impacted cerumen, left ear: Secondary | ICD-10-CM | POA: Diagnosis not present

## 2017-11-09 DIAGNOSIS — H903 Sensorineural hearing loss, bilateral: Secondary | ICD-10-CM | POA: Diagnosis not present

## 2017-11-15 DIAGNOSIS — Z125 Encounter for screening for malignant neoplasm of prostate: Secondary | ICD-10-CM | POA: Diagnosis not present

## 2017-11-15 DIAGNOSIS — E7849 Other hyperlipidemia: Secondary | ICD-10-CM | POA: Diagnosis not present

## 2017-11-15 DIAGNOSIS — I10 Essential (primary) hypertension: Secondary | ICD-10-CM | POA: Diagnosis not present

## 2017-11-15 DIAGNOSIS — R7301 Impaired fasting glucose: Secondary | ICD-10-CM | POA: Diagnosis not present

## 2017-11-15 DIAGNOSIS — M859 Disorder of bone density and structure, unspecified: Secondary | ICD-10-CM | POA: Diagnosis not present

## 2017-11-15 DIAGNOSIS — R946 Abnormal results of thyroid function studies: Secondary | ICD-10-CM | POA: Diagnosis not present

## 2017-11-15 DIAGNOSIS — R82998 Other abnormal findings in urine: Secondary | ICD-10-CM | POA: Diagnosis not present

## 2017-11-22 DIAGNOSIS — R946 Abnormal results of thyroid function studies: Secondary | ICD-10-CM | POA: Diagnosis not present

## 2017-11-22 DIAGNOSIS — Z6820 Body mass index (BMI) 20.0-20.9, adult: Secondary | ICD-10-CM | POA: Diagnosis not present

## 2017-11-22 DIAGNOSIS — J3089 Other allergic rhinitis: Secondary | ICD-10-CM | POA: Diagnosis not present

## 2017-11-22 DIAGNOSIS — N183 Chronic kidney disease, stage 3 (moderate): Secondary | ICD-10-CM | POA: Diagnosis not present

## 2017-11-22 DIAGNOSIS — Z1389 Encounter for screening for other disorder: Secondary | ICD-10-CM | POA: Diagnosis not present

## 2017-11-22 DIAGNOSIS — R05 Cough: Secondary | ICD-10-CM | POA: Diagnosis not present

## 2017-11-22 DIAGNOSIS — D6489 Other specified anemias: Secondary | ICD-10-CM | POA: Diagnosis not present

## 2017-11-22 DIAGNOSIS — J45998 Other asthma: Secondary | ICD-10-CM | POA: Diagnosis not present

## 2017-11-22 DIAGNOSIS — Z0000 Encounter for general adult medical examination without abnormal findings: Secondary | ICD-10-CM | POA: Diagnosis not present

## 2017-11-22 DIAGNOSIS — N401 Enlarged prostate with lower urinary tract symptoms: Secondary | ICD-10-CM | POA: Diagnosis not present

## 2017-11-22 DIAGNOSIS — K5909 Other constipation: Secondary | ICD-10-CM | POA: Diagnosis not present

## 2017-11-22 DIAGNOSIS — Z23 Encounter for immunization: Secondary | ICD-10-CM | POA: Diagnosis not present

## 2017-11-22 DIAGNOSIS — H6122 Impacted cerumen, left ear: Secondary | ICD-10-CM | POA: Diagnosis not present

## 2017-11-29 DIAGNOSIS — Z1212 Encounter for screening for malignant neoplasm of rectum: Secondary | ICD-10-CM | POA: Diagnosis not present

## 2017-12-01 ENCOUNTER — Ambulatory Visit (INDEPENDENT_AMBULATORY_CARE_PROVIDER_SITE_OTHER): Payer: Medicare Other

## 2017-12-01 ENCOUNTER — Encounter (INDEPENDENT_AMBULATORY_CARE_PROVIDER_SITE_OTHER): Payer: Self-pay | Admitting: Orthopedic Surgery

## 2017-12-01 ENCOUNTER — Ambulatory Visit (INDEPENDENT_AMBULATORY_CARE_PROVIDER_SITE_OTHER): Payer: Medicare Other | Admitting: Orthopedic Surgery

## 2017-12-01 DIAGNOSIS — M25511 Pain in right shoulder: Secondary | ICD-10-CM | POA: Diagnosis not present

## 2017-12-02 ENCOUNTER — Encounter (INDEPENDENT_AMBULATORY_CARE_PROVIDER_SITE_OTHER): Payer: Self-pay | Admitting: Orthopedic Surgery

## 2017-12-02 NOTE — Progress Notes (Signed)
Office Visit Note   Patient: [name].           Date of Birth: Feb 02, 1925           MRN: [id number] Visit Date: 12/01/2017 Requested by: Crist Infante, MD [address] [place], [place] [address] PCP: Crist Infante, MD  Subjective: Chief Complaint  Patient presents with  . Right Shoulder - Pain    HPI: [name] is a patient with right shoulder pain.  He said pain for several years.  It comes and goes.  Better with rest and worse with activity.  He is right-hand dominant.  Hurts him at night.  Pain typically does go away but overhead motion is worse.  Does not take any medication for the problem.              ROS: All systems reviewed are negative as they relate to the chief complaint within the history of present illness.  Patient denies  fevers or chills.   Assessment & Plan: Visit Diagnoses:  1. Right shoulder pain, unspecified chronicity     Plan: Impression is right shoulder pain with evidence of possible rotator cuff chronic pathology.  He may also have a component of bursitis.  Subacromial injection performed.  We will see how that does.  I do not think he is at the point where he needs any type of intervention but I think an injection is indicated.  I will see him back as needed  Follow-Up Instructions: Return if symptoms worsen or fail to improve.   Orders:  Orders Placed This Encounter  Procedures  . XR Shoulder Right   No orders of the defined types were placed in this encounter.     Procedures: No procedures performed   Clinical Data: No additional findings.  Objective: Vital Signs: There were no vitals taken for this visit.  Physical Exam:   Constitutional: Patient appears well-developed HEENT:  Head: Normocephalic Eyes:EOM are normal Neck: Normal range of motion Cardiovascular: Normal rate Pulmonary/chest: Effort normal Neurologic: Patient is alert Skin: Skin is warm Psychiatric: Patient has normal mood and affect    Ortho Exam: Ortho  exam demonstrates pretty good active and passive range of motion of that right shoulder with only slight weakness to infraspinatus testing on the right compared to the left.  No other masses lymph adenopathy or skin changes noted in that shoulder girdle region.  He is got no AC joint tenderness to direct palpation and slight coarse grinding and crepitus with active and passive range of motion of that right shoulder in the left shoulder.  No evidence of restriction of external rotation of 15 degrees of abduction  Specialty Comments:  No specialty comments available.  Imaging: No results found.   PMFS History: Patient Active Problem List   Diagnosis Date Noted  . OA (osteoarthritis) of hip 05/05/2016  . MGUS (monoclonal gammopathy of unknown significance) 08/10/2011  . ANEMIA-UNSPECIFIED 01/15/2010  . CONSTIPATION 01/15/2010  . PERSONAL HX COLONIC POLYPS 01/15/2010   Past Medical History:  Diagnosis Date  . Anemia   . Arthritis   . Cataract   . Complication following bilateral mastoidectomy   . Dysphagia    with large pills  . Gout   . Wrist fracture    left    History reviewed. No pertinent family history.  Past Surgical History:  Procedure Laterality Date  . cataracts    . TOTAL HIP ARTHROPLASTY Left 05/05/2016   Procedure: LEFT TOTAL HIP ARTHROPLASTY ANTERIOR APPROACH;  Surgeon: Gaynelle Arabian, MD;  Location: [facility];  Service: Orthopedics;  Laterality: Left;   Social History   Occupational History  . Not on file  Tobacco Use  . Smoking status: Never Smoker  . Smokeless tobacco: Never Used  Substance and Sexual Activity  . Alcohol use: No  . Drug use: No  . Sexual activity: Not on file

## 2018-04-07 DIAGNOSIS — H903 Sensorineural hearing loss, bilateral: Secondary | ICD-10-CM | POA: Diagnosis not present

## 2018-04-07 DIAGNOSIS — H6121 Impacted cerumen, right ear: Secondary | ICD-10-CM | POA: Diagnosis not present

## 2018-05-15 DIAGNOSIS — R946 Abnormal results of thyroid function studies: Secondary | ICD-10-CM | POA: Diagnosis not present

## 2018-05-22 DIAGNOSIS — D649 Anemia, unspecified: Secondary | ICD-10-CM | POA: Diagnosis not present

## 2018-05-22 DIAGNOSIS — J309 Allergic rhinitis, unspecified: Secondary | ICD-10-CM | POA: Diagnosis not present

## 2018-05-22 DIAGNOSIS — D472 Monoclonal gammopathy: Secondary | ICD-10-CM | POA: Diagnosis not present

## 2018-05-22 DIAGNOSIS — Z6820 Body mass index (BMI) 20.0-20.9, adult: Secondary | ICD-10-CM | POA: Diagnosis not present

## 2018-05-22 DIAGNOSIS — M25511 Pain in right shoulder: Secondary | ICD-10-CM | POA: Diagnosis not present

## 2018-05-22 DIAGNOSIS — R7301 Impaired fasting glucose: Secondary | ICD-10-CM | POA: Diagnosis not present

## 2018-05-22 DIAGNOSIS — I10 Essential (primary) hypertension: Secondary | ICD-10-CM | POA: Diagnosis not present

## 2018-05-22 DIAGNOSIS — R946 Abnormal results of thyroid function studies: Secondary | ICD-10-CM | POA: Diagnosis not present

## 2018-05-22 DIAGNOSIS — M25512 Pain in left shoulder: Secondary | ICD-10-CM | POA: Diagnosis not present

## 2018-07-20 DIAGNOSIS — N401 Enlarged prostate with lower urinary tract symptoms: Secondary | ICD-10-CM | POA: Diagnosis not present

## 2018-07-20 DIAGNOSIS — R351 Nocturia: Secondary | ICD-10-CM | POA: Diagnosis not present

## 2018-07-31 ENCOUNTER — Emergency Department (HOSPITAL_COMMUNITY)
Admission: EM | Admit: 2018-07-31 | Discharge: 2018-07-31 | Disposition: A | Discharge status: Home / Self Care | Payer: Medicare Other | Attending: Emergency Medicine | Admitting: Emergency Medicine

## 2018-07-31 ENCOUNTER — Emergency Department (HOSPITAL_COMMUNITY): Payer: Medicare Other

## 2018-07-31 ENCOUNTER — Other Ambulatory Visit: Payer: Self-pay

## 2018-07-31 ENCOUNTER — Encounter (HOSPITAL_COMMUNITY): Payer: Self-pay

## 2018-07-31 DIAGNOSIS — Z79899 Other long term (current) drug therapy: Secondary | ICD-10-CM | POA: Diagnosis not present

## 2018-07-31 DIAGNOSIS — Y92312 Tennis court as the place of occurrence of the external cause: Secondary | ICD-10-CM | POA: Diagnosis not present

## 2018-07-31 DIAGNOSIS — S76111A Strain of right quadriceps muscle, fascia and tendon, initial encounter: Secondary | ICD-10-CM | POA: Diagnosis not present

## 2018-07-31 DIAGNOSIS — Y9373 Activity, racquet and hand sports: Secondary | ICD-10-CM | POA: Insufficient documentation

## 2018-07-31 DIAGNOSIS — W19XXXA Unspecified fall, initial encounter: Secondary | ICD-10-CM | POA: Diagnosis not present

## 2018-07-31 DIAGNOSIS — X501XXA Overexertion from prolonged static or awkward postures, initial encounter: Secondary | ICD-10-CM | POA: Insufficient documentation

## 2018-07-31 DIAGNOSIS — Y999 Unspecified external cause status: Secondary | ICD-10-CM | POA: Diagnosis not present

## 2018-07-31 DIAGNOSIS — S8991XA Unspecified injury of right lower leg, initial encounter: Secondary | ICD-10-CM | POA: Diagnosis present

## 2018-07-31 DIAGNOSIS — M25561 Pain in right knee: Secondary | ICD-10-CM | POA: Diagnosis not present

## 2018-07-31 DIAGNOSIS — I739 Peripheral vascular disease, unspecified: Secondary | ICD-10-CM | POA: Diagnosis not present

## 2018-07-31 DIAGNOSIS — R52 Pain, unspecified: Secondary | ICD-10-CM | POA: Diagnosis not present

## 2018-07-31 DIAGNOSIS — S76101A Unspecified injury of right quadriceps muscle, fascia and tendon, initial encounter: Secondary | ICD-10-CM | POA: Diagnosis not present

## 2018-07-31 NOTE — ED Notes (Signed)
Patient transported to X-ray 

## 2018-07-31 NOTE — ED Notes (Signed)
PT at bedside.

## 2018-07-31 NOTE — ED Triage Notes (Signed)
Pt brought in via EMS. Pt was on tennis court playing tennis for his birthday. Over extended while playing and injured right knee. No LOC, pt did not hit head. Pt not able to bear weight on right LE.  No meds given by EMS

## 2018-07-31 NOTE — ED Notes (Signed)
Pt d/c home per MD order. Discharge summary reviewed, pt verbalizes understanding. Off unit via wheelchair. Pt family here for discharge ride home.

## 2018-07-31 NOTE — ED Notes (Signed)
Bed: WA14 Expected date:  Expected time:  Means of arrival:  Comments: EMS 

## 2018-07-31 NOTE — Discharge Instructions (Addendum)
Make sure to keep the knee immobilizer on.  Use your walker at home to help support you as you walk.  Call your orthopedic doctor tomorrow to schedule an outpatient appointment in the next week

## 2018-07-31 NOTE — ED Provider Notes (Signed)
[facility] DEPT Provider Note   CSN: [id number] Arrival date & time: 07/31/18  1505    History   Chief Complaint Chief Complaint  Patient presents with  . Knee Injury    right    HPI [name]. is a 85 y.o. male.     HPI Patient presents to the emergency room for evaluation of knee pain.  Patient was playing tennis today in celebration of his 89nd birthday.  Patient was reaching out for a ball when he felt a sudden pop in his right knee.  Patient is not having much pain but is unable to straighten his leg now.  He denies any other injuries.  He denies any numbness or weakness.  Patient has never had this issue before. Past Medical History:  Diagnosis Date  . Anemia   . Arthritis   . Cataract   . Complication following bilateral mastoidectomy   . Dysphagia    with large pills  . Gout   . Wrist fracture    left    Patient Active Problem List   Diagnosis Date Noted  . OA (osteoarthritis) of hip 05/05/2016  . MGUS (monoclonal gammopathy of unknown significance) 08/10/2011  . ANEMIA-UNSPECIFIED 01/15/2010  . CONSTIPATION 01/15/2010  . PERSONAL HX COLONIC POLYPS 01/15/2010    Past Surgical History:  Procedure Laterality Date  . cataracts    . TOTAL HIP ARTHROPLASTY Left 05/05/2016   Procedure: LEFT TOTAL HIP ARTHROPLASTY ANTERIOR APPROACH;  Surgeon: Gaynelle Arabian, MD;  Location: [facility];  Service: Orthopedics;  Laterality: Left;        Home Medications    Prior to Admission medications   Medication Sig Start Date End Date Taking? Authorizing Provider  chlorthalidone (HYGROTON) 25 MG tablet Take 12.5 mg by mouth daily.    [provider]  finasteride (PROSCAR) 5 MG tablet Take 5 mg by mouth daily.    [provider]  methocarbamol (ROBAXIN) 500 MG tablet Take 1 tablet (500 mg total) by mouth every 6 (six) hours as needed for muscle spasms. 05/07/16   Perkins, Alexzandrew L, PA-C  NON FORMULARY Take 1 tablet by  mouth daily. Vision Vite - 1 tablet daily      [provider]  oxyCODONE (OXY IR/ROXICODONE) 5 MG immediate release tablet Take 1-2 tablets (5-10 mg total) by mouth every 4 (four) hours as needed for moderate pain or severe pain. 05/07/16   Perkins, Alexzandrew L, PA-C  polyethylene glycol (MIRALAX / GLYCOLAX) packet Take 17 g by mouth daily as needed for mild constipation.     [provider]  rosuvastatin (CRESTOR) 5 MG tablet Take 5 mg by mouth daily.    [provider]  traMADol (ULTRAM) 50 MG tablet Take 1-2 tablets (50-100 mg total) by mouth every 6 (six) hours as needed for moderate pain. 05/07/16   Perkins, Alexzandrew L, PA-C    Family History History reviewed. No pertinent family history.  Social History Social History   Tobacco Use  . Smoking status: Never Smoker  . Smokeless tobacco: Never Used  Substance Use Topics  . Alcohol use: No  . Drug use: No     Allergies   Patient has no known allergies.   Review of Systems Review of Systems  All other systems reviewed and are negative.    Physical Exam Updated Vital Signs BP (!) 146/75   Pulse (!) 116   Temp 97.9 F (36.6 C) (Oral)   Resp 16  Ht 1.702 m (5\' 7" )   Wt 57.2 kg   SpO2 100%   BMI 19.73 kg/m   Physical Exam Vitals signs and nursing note reviewed.  Constitutional:      General: He is not in acute distress.    Appearance: He is well-developed.  HENT:     Head: Normocephalic and atraumatic.     Right Ear: External ear normal.     Left Ear: External ear normal.  Eyes:     General: No scleral icterus.       Right eye: No discharge.        Left eye: No discharge.     Conjunctiva/sclera: Conjunctivae normal.  Neck:     Musculoskeletal: Neck supple.     Trachea: No tracheal deviation.  Cardiovascular:     Rate and Rhythm: Normal rate.  Pulmonary:     Effort: Pulmonary effort is normal. No respiratory distress.     Breath sounds: No stridor.  Abdominal:     General:  There is no distension.  Musculoskeletal:        General: No swelling.     Right knee: He exhibits decreased range of motion, swelling and deformity. No tenderness found.     Comments: Patient is unable to extend his lower leg.  Palpable defect proximal to the patella in the quadriceps tendon region  Skin:    General: Skin is warm and dry.     Findings: No rash.  Neurological:     Mental Status: He is alert.     Cranial Nerves: Cranial nerve deficit: no gross deficits.      ED Treatments / Results   Radiology Dg Knee Complete 4 Views Right  Result Date: 07/31/2018 CLINICAL DATA:  Knee pain.  Injured playing tennis. EXAM: RIGHT KNEE - COMPLETE 4+ VIEW COMPARISON:  None. FINDINGS: No evidence of fracture, dislocation, or joint effusion. No evidence of arthropathy or other focal bone abnormality. Soft tissues are unremarkable. Peripheral vascular atherosclerotic disease. Haziness around the distal quadriceps tendon insertion may reflect forceps tendon injury. IMPRESSION: No acute osseous injury of the right knee. Peripheral vascular atherosclerotic disease. Haziness around the distal quadriceps tendon insertion may reflect forceps tendon injury. Electronically Signed   By: Kathreen Devoid   On: 07/31/2018 16:47    Procedures Procedures (including critical care time)  Medications Ordered in ED Medications - No data to display   Initial Impression / Assessment and Plan / ED Course  I have reviewed the triage vital signs and the nursing notes.  Pertinent labs & imaging results that were available during my care of the patient were reviewed by me and considered in my medical decision making (see chart for details).     Patient presented to the ED for evaluation of a knee injury.  His exam and history were consistent with a quadricep tendon rupture.  Patient's x-rays do not show any acute abnormalities.  The patient is very healthy and active especially for his age.  He was out playing  tennis today.  Patient does have a walker at home he used with prior orthopedic injuries.  I will have him use his walker and placed in the knee immobilizer.  I discussed close outpatient follow-up with his orthopedic doctor.  Final Clinical Impressions(s) / ED Diagnoses   Final diagnoses:  Quadriceps tendon rupture, right, initial encounter    ED Discharge Orders    None       Dorie Rank, MD 07/31/18 1732

## 2018-07-31 NOTE — ED Notes (Signed)
EDP at bedside  

## 2018-08-09 DIAGNOSIS — M25561 Pain in right knee: Secondary | ICD-10-CM | POA: Diagnosis not present

## 2018-08-10 DIAGNOSIS — S76191A Other specified injury of right quadriceps muscle, fascia and tendon, initial encounter: Secondary | ICD-10-CM | POA: Diagnosis not present

## 2018-08-11 NOTE — Progress Notes (Signed)
Sent Amber C. an inbox request through epic for pre op orders.

## 2018-08-18 NOTE — Patient Instructions (Addendum)
[place]    Your procedure is scheduled on: 08-23-2018   Report to [facility] Main  Entrance Report to admitting at 4:00 PM    [place] 19 TEST ON__6/22_____ @_After  this visit  If you have not had a test.______,  THIS TEST MUST BE DONE BEFORE SURGERY, COME TO [place]. ONCE YOUR COVID TEST IS COMPLETED, PLEASE BEGIN THE QUARANTINE INSTRUCTIONS AS OUTLINED IN YOUR HANDOUT.    Call this number if you have problems the morning of surgery [phone]     Remember: [place],                                  NO CHEWING GUM CANDY OR MINTS.   NO SOLID FOOD AFTER MIDNIGHT THE NIGHT PRIOR TO SURGERY.  NOTHING BY MOUTH EXCEPT CLEAR LIQUIDS UNTIL  3:00 PM.  PLEASE FINISH ENSURE DRINK PER SURGEON ORDER .     TIME WHICH NEEDS TO BE COMPLETED AT 3:00 PM.    CLEAR LIQUID DIET   Foods Allowed                                                                     Foods Excluded  Coffee and tea, regular and decaf                             liquids that you cannot  Plain Jell-O in any flavor                                             see through such as: Fruit ices (not with fruit pulp)                                     milk, soups, orange juice  Iced Popsicles                                    All solid food Carbonated beverages, regular and diet                                    Cranberry, grape and apple juices Sports drinks like Gatorade Lightly seasoned clear broth or consume(fat free) Sugar, honey syrup     Take these medicines the morning of surgery with A SIP OF WATER:            ROSUVASTTAIN (CRESTOR), AMLODIPINE (NORVASC), FINASTERIDE (PROSCAR)                                You may not have  any metal on your body including hair pins and piercings              Do not wear jewelry,  lotions,  deodorant                       Men may  shave face and neck.   Do not bring valuables to the hospital. [place].  Contacts, dentures or bridgework may not be worn into surgery.      Patients discharged the day of surgery will not be allowed to drive home.  IF YOU ARE HAVING SURGERY AND GOING HOME THE SAME DAY, YOU MUST HAVE AN ADULT TO DRIVE YOU HOME AND BE WITH YOU FOR 24 HOURS.  YOU MAY GO HOME BY TAXI OR UBER OR ORTHERWISE, BUT AN ADULT MUST ACCOMPANY YOU HOME AND STAY WITH YOU FOR 24 HOURS.   Name and phone number of your driver:  Special Instructions: N/A              Please read over the following fact sheets you were given:   [facility] - Preparing for Surgery  Before surgery, you can play an important role .  Because skin is not sterile, your skin needs to be as free of germs as possible.   You can reduce the number of germs on your skin by washing with CHG (chlorahexidine gluconate) soap before surgery.   CHG is an antiseptic cleaner which kills germs and bonds with the skin to continue killing germs even after washing. Please DO NOT use if you have an allergy to CHG or antibacterial soaps.   If your skin becomes reddened/irritated stop using the CHG and inform your nurse when you arrive at Short Stay. Do not shave (including legs and underarms) for at least 48 hours prior to the first CHG shower.   You may shave your face/neck.  Please follow these instructions carefully:   1.  Shower with CHG Soap the night before surgery and the  morning of Surgery.   2.  If you choose to wash your hair, wash your hair first as usual with your  normal  Shampoo.   3.  After you shampoo, rinse your hair and body thoroughly to remove the  Shampoo .                                        4.  Use CHG as you would any other liquid soap.  You can apply chg directly  to the skin and wash                   Gently with a scrungie or clean washcloth .  5.  Apply the CHG Soap to  your body ONLY FROM THE NECK DOWN.                   Do not use on face/ open                           Wound or open sores. Avoid contact with eyes, ears mouth and genitals (private parts).  Wash face,  Genitals (private parts) with your normal soap.             6.  Wash thoroughly, paying special attention to the area where your surgery  will be performed.   7.  Thoroughly rinse your body with warm water from the neck down.   8.  DO NOT shower/wash with your normal soap after using and rinsing off  the CHG Soap.             9.  Pat yourself dry with a clean towel.             10.  Wear clean pajamas .            11.  Place clean sheets on your bed the night of your first shower and do not  sleep with pets.  Day of Surgery : Do not apply any lotions/deodorants the morning of surgery.  Please wear clean clothes to the hospital/surgery center.   FAILURE TO FOLLOW THESE INSTRUCTIONS MAY RESULT IN THE CANCELLATION OF YOUR SURGERY PATIENT SIGNATURE_________________________________  NURSE SIGNATURE__________________________________   _ ____________ Incentive Spirometer  An incentive spirometer is a tool that can help keep your lungs clear and active. This tool measures how well you are filling your lungs with each breath. Taking long deep breaths may help reverse or decrease the chance of developing breathing (pulmonary) problems (especially infection) following:  A long period of time when you are unable to move or be active. BEFORE THE PROCEDURE   If the spirometer includes an indicator to show your best effort, your nurse or respiratory therapist will set it to a desired goal.  If possible, sit up straight or lean slightly forward. Try not to slouch.  Hold the incentive spirometer in an upright position. INSTRUCTIONS FOR USE  1. Sit on the edge of your bed if possible, or sit up as far as you can in bed or on a chair. 2. Hold the incentive spirometer in an  upright position. 3. Breathe out normally. 4. Place the mouthpiece in your mouth and seal your lips tightly around it. 5. Breathe in slowly and as deeply as possible, raising the piston or the ball toward the top of the column. 6. Hold your breath for 3-5 seconds or for as long as possible. Allow the piston or ball to fall to the bottom of the column. 7. Remove the mouthpiece from your mouth and breathe out normally. 8. Rest for a few seconds and repeat Steps 1 through 7 at least 10 times every 1-2 hours when you are awake. Take your time and take a few normal breaths between deep breaths. 9. The spirometer may include an indicator to show your best effort. Use the indicator as a goal to work toward during each repetition. 10. After each set of 10 deep breaths, practice coughing to be sure your lungs are clear. If you have an incision (the cut made at the time of surgery), support your incision when coughing by placing a pillow or rolled up towels firmly against it. Once you are able to get out of bed, walk around indoors and cough well. You may stop using the incentive spirometer when instructed by your caregiver.  RISKS AND COMPLICATIONS  Take your time so you do not get dizzy or light-headed.  If you are in pain, you may need to take or ask for pain medication before doing incentive spirometry. It is harder to take a deep breath if you  are having pain. AFTER USE  Rest and breathe slowly and easily.  It can be helpful to keep track of a log of your progress. Your caregiver can provide you with a simple table to help with this. If you are using the spirometer at home, follow these instructions: [place] IF:   You are having difficultly using the spirometer.  You have trouble using the spirometer as often as instructed.  Your pain medication is not giving enough relief while using the spirometer.  You develop fever of 100.5 F (38.1 C) or higher. SEEK IMMEDIATE MEDICAL CARE  IF:   You cough up bloody sputum that had not been present before.  You develop fever of 102 F (38.9 C) or greater.  You develop worsening pain at or near the incision site. MAKE SURE YOU:   Understand these instructions.  Will watch your condition.  Will get help right away if you are not doing well or get worse. Document Released: 06/28/2006 Document Revised: 05/10/2011 Document Reviewed: 08/29/2006 ExitCare Patient Information 2014 ExitCare, [place].   ________________________________________________________________________  WHAT IS A BLOOD TRANSFUSION? Blood Transfusion Information  A transfusion is the replacement of blood or some of its parts. Blood is made up of multiple cells which provide different functions.  Red blood cells carry oxygen and are used for blood loss replacement.  White blood cells fight against infection.  Platelets control bleeding.  Plasma helps clot blood.  Other blood products are available for specialized needs, such as hemophilia or other clotting disorders. BEFORE THE TRANSFUSION  Who gives blood for transfusions?   Healthy volunteers who are fully evaluated to make sure their blood is safe. This is blood bank blood. Transfusion therapy is the safest it has ever been in the practice of medicine. Before blood is taken from a donor, a complete history is taken to make sure that person has no history of diseases nor engages in risky social behavior (examples are intravenous drug use or sexual activity with multiple partners). The donor's travel history is screened to minimize risk of transmitting infections, such as malaria. The donated blood is tested for signs of infectious diseases, such as HIV and hepatitis. The blood is then tested to be sure it is compatible with you in order to minimize the chance of a transfusion reaction. If you or a relative donates blood, this is often done in anticipation of surgery and is not appropriate for emergency  situations. It takes many days to process the donated blood. RISKS AND COMPLICATIONS Although transfusion therapy is very safe and saves many lives, the main dangers of transfusion include:   Getting an infectious disease.  Developing a transfusion reaction. This is an allergic reaction to something in the blood you were given. Every precaution is taken to prevent this. The decision to have a blood transfusion has been considered carefully by your caregiver before blood is given. Blood is not given unless the benefits outweigh the risks. AFTER THE TRANSFUSION  Right after receiving a blood transfusion, you will usually feel much better and more energetic. This is especially true if your red blood cells have gotten low (anemic). The transfusion raises the level of the red blood cells which carry oxygen, and this usually causes an energy increase.  The nurse administering the transfusion will monitor you carefully for complications. HOME CARE INSTRUCTIONS  No special instructions are needed after a transfusion. You may find your energy is better. Speak with your caregiver about any limitations on activity  for underlying diseases you may have. SEEK MEDICAL CARE IF:   Your condition is not improving after your transfusion.  You develop redness or irritation at the intravenous (IV) site. SEEK IMMEDIATE MEDICAL CARE IF:  Any of the following symptoms occur over the next 12 hours:  Shaking chills.  You have a temperature by mouth above 102 F (38.9 C), not controlled by medicine.  Chest, back, or muscle pain.  People around you feel you are not acting correctly or are confused.  Shortness of breath or difficulty breathing.  Dizziness and fainting.  You get a rash or develop hives.  You have a decrease in urine output.  Your urine turns a dark color or changes to pink, red, or brown. Any of the following symptoms occur over the next 10 days:  You have a temperature by mouth above  102 F (38.9 C), not controlled by medicine.  Shortness of breath.  Weakness after normal activity.  The white part of the eye turns yellow (jaundice).  You have a decrease in the amount of urine or are urinating less often.  Your urine turns a dark color or changes to pink, red, or brown. Document Released: 02/13/2000 Document Revised: 05/10/2011 Document Reviewed: 10/02/2007 [facility] Patient Information 2014 [place], [place].  _______________________________________________________________________

## 2018-08-19 ENCOUNTER — Other Ambulatory Visit (HOSPITAL_COMMUNITY)
Admission: RE | Admit: 2018-08-19 | Discharge: 2018-08-19 | Disposition: A | Discharge status: Home / Self Care | Payer: Medicare Other | Source: Ambulatory Visit | Attending: Orthopedic Surgery | Admitting: Orthopedic Surgery

## 2018-08-19 DIAGNOSIS — Z1159 Encounter for screening for other viral diseases: Secondary | ICD-10-CM | POA: Diagnosis not present

## 2018-08-20 LAB — SARS CORONAVIRUS 2 (TAT 6-24 HRS)

## 2018-08-21 ENCOUNTER — Other Ambulatory Visit: Payer: Self-pay

## 2018-08-21 ENCOUNTER — Other Ambulatory Visit (HOSPITAL_COMMUNITY)
Admission: RE | Admit: 2018-08-21 | Discharge: 2018-08-21 | Disposition: A | Discharge status: Home / Self Care | Payer: Medicare Other | Source: Ambulatory Visit | Attending: Orthopedic Surgery | Admitting: Orthopedic Surgery

## 2018-08-21 ENCOUNTER — Encounter (HOSPITAL_COMMUNITY)
Admission: RE | Admit: 2018-08-21 | Discharge: 2018-08-21 | Disposition: A | Discharge status: Home / Self Care | Payer: Medicare Other | Source: Ambulatory Visit | Attending: Orthopedic Surgery | Admitting: Orthopedic Surgery

## 2018-08-21 ENCOUNTER — Encounter (HOSPITAL_COMMUNITY): Payer: Self-pay

## 2018-08-21 DIAGNOSIS — Z01818 Encounter for other preprocedural examination: Secondary | ICD-10-CM | POA: Insufficient documentation

## 2018-08-21 DIAGNOSIS — Z1159 Encounter for screening for other viral diseases: Secondary | ICD-10-CM | POA: Diagnosis not present

## 2018-08-21 DIAGNOSIS — Z79899 Other long term (current) drug therapy: Secondary | ICD-10-CM | POA: Diagnosis not present

## 2018-08-21 DIAGNOSIS — Z96642 Presence of left artificial hip joint: Secondary | ICD-10-CM | POA: Diagnosis not present

## 2018-08-21 DIAGNOSIS — I10 Essential (primary) hypertension: Secondary | ICD-10-CM | POA: Insufficient documentation

## 2018-08-21 DIAGNOSIS — S76111A Strain of right quadriceps muscle, fascia and tendon, initial encounter: Secondary | ICD-10-CM | POA: Diagnosis not present

## 2018-08-21 DIAGNOSIS — I491 Atrial premature depolarization: Secondary | ICD-10-CM | POA: Insufficient documentation

## 2018-08-21 DIAGNOSIS — M1990 Unspecified osteoarthritis, unspecified site: Secondary | ICD-10-CM | POA: Diagnosis not present

## 2018-08-21 DIAGNOSIS — Y9373 Activity, racquet and hand sports: Secondary | ICD-10-CM | POA: Diagnosis not present

## 2018-08-21 DIAGNOSIS — X58XXXA Exposure to other specified factors, initial encounter: Secondary | ICD-10-CM | POA: Diagnosis not present

## 2018-08-21 DIAGNOSIS — Z7982 Long term (current) use of aspirin: Secondary | ICD-10-CM | POA: Diagnosis not present

## 2018-08-21 LAB — PROTIME-INR
INR: 0.9 (ref 0.8–1.2)
Prothrombin Time: 12.5 seconds (ref 11.4–15.2)

## 2018-08-21 LAB — CBC
HCT: 35 % — ABNORMAL LOW (ref 39.0–52.0)
Hemoglobin: 11.2 g/dL — ABNORMAL LOW (ref 13.0–17.0)
MCH: 32.6 pg (ref 26.0–34.0)
MCHC: 32 g/dL (ref 30.0–36.0)
MCV: 101.7 fL — ABNORMAL HIGH (ref 80.0–100.0)
Platelets: 236 10*3/uL (ref 150–400)
RBC: 3.44 MIL/uL — ABNORMAL LOW (ref 4.22–5.81)
RDW: 12.7 % (ref 11.5–15.5)
WBC: 5.3 10*3/uL (ref 4.0–10.5)
nRBC: 0 % (ref 0.0–0.2)

## 2018-08-21 LAB — COMPREHENSIVE METABOLIC PANEL
ALT: 17 U/L (ref 0–44)
AST: 22 U/L (ref 15–41)
Albumin: 4 g/dL (ref 3.5–5.0)
Alkaline Phosphatase: 56 U/L (ref 38–126)
Anion gap: 10 (ref 5–15)
BUN: 39 mg/dL — ABNORMAL HIGH (ref 8–23)
CO2: 28 mmol/L (ref 22–32)
Calcium: 9.6 mg/dL (ref 8.9–10.3)
Chloride: 103 mmol/L (ref 98–111)
Creatinine, Ser: 1.31 mg/dL — ABNORMAL HIGH (ref 0.61–1.24)
GFR calc Af Amer: 54 mL/min — ABNORMAL LOW (ref >60)
GFR calc non Af Amer: 47 mL/min — ABNORMAL LOW (ref >60)
Glucose, Bld: 122 mg/dL — ABNORMAL HIGH (ref 70–99)
Potassium: 4.1 mmol/L (ref 3.5–5.1)
Sodium: 141 mmol/L (ref 135–145)
Total Bilirubin: 0.4 mg/dL (ref 0.3–1.2)
Total Protein: 7 g/dL (ref 6.5–8.1)

## 2018-08-21 LAB — APTT: aPTT: 30 seconds (ref 24–36)

## 2018-08-21 LAB — SARS CORONAVIRUS 2 BY RT PCR (HOSPITAL ORDER, PERFORMED IN ~~LOC~~ HOSPITAL LAB): SARS Coronavirus 2: NEGATIVE

## 2018-08-21 NOTE — Progress Notes (Signed)
Konrad Felix PA  Dr. Crist Infante prescribed ASA and asked pt to stop taking 08/21/18 EKG and Labs are in Epic.

## 2018-08-23 ENCOUNTER — Ambulatory Visit (HOSPITAL_COMMUNITY): Payer: Medicare Other | Admitting: Registered Nurse

## 2018-08-23 ENCOUNTER — Encounter (HOSPITAL_COMMUNITY): Payer: Self-pay | Admitting: *Deleted

## 2018-08-23 ENCOUNTER — Other Ambulatory Visit: Payer: Self-pay

## 2018-08-23 ENCOUNTER — Ambulatory Visit (HOSPITAL_COMMUNITY): Payer: Medicare Other | Admitting: Physician Assistant

## 2018-08-23 ENCOUNTER — Encounter (HOSPITAL_COMMUNITY)
Admission: RE | Disposition: A | Discharge status: Home / Self Care | Payer: Self-pay | Source: Home / Self Care | Attending: Orthopedic Surgery

## 2018-08-23 ENCOUNTER — Observation Stay (HOSPITAL_COMMUNITY)
Admission: RE | Admit: 2018-08-23 | Discharge: 2018-08-24 | Disposition: A | Discharge status: Home Health | Payer: Medicare Other | Attending: Orthopedic Surgery | Admitting: Orthopedic Surgery

## 2018-08-23 DIAGNOSIS — Z96642 Presence of left artificial hip joint: Secondary | ICD-10-CM | POA: Insufficient documentation

## 2018-08-23 DIAGNOSIS — Y9373 Activity, racquet and hand sports: Secondary | ICD-10-CM | POA: Insufficient documentation

## 2018-08-23 DIAGNOSIS — D649 Anemia, unspecified: Secondary | ICD-10-CM | POA: Diagnosis not present

## 2018-08-23 DIAGNOSIS — M1990 Unspecified osteoarthritis, unspecified site: Secondary | ICD-10-CM | POA: Diagnosis not present

## 2018-08-23 DIAGNOSIS — Z1159 Encounter for screening for other viral diseases: Secondary | ICD-10-CM | POA: Diagnosis not present

## 2018-08-23 DIAGNOSIS — S76111D Strain of right quadriceps muscle, fascia and tendon, subsequent encounter: Secondary | ICD-10-CM

## 2018-08-23 DIAGNOSIS — S76191A Other specified injury of right quadriceps muscle, fascia and tendon, initial encounter: Secondary | ICD-10-CM | POA: Diagnosis not present

## 2018-08-23 DIAGNOSIS — Z79899 Other long term (current) drug therapy: Secondary | ICD-10-CM | POA: Insufficient documentation

## 2018-08-23 DIAGNOSIS — X58XXXA Exposure to other specified factors, initial encounter: Secondary | ICD-10-CM | POA: Insufficient documentation

## 2018-08-23 DIAGNOSIS — S76111A Strain of right quadriceps muscle, fascia and tendon, initial encounter: Principal | ICD-10-CM | POA: Insufficient documentation

## 2018-08-23 DIAGNOSIS — G8918 Other acute postprocedural pain: Secondary | ICD-10-CM | POA: Diagnosis not present

## 2018-08-23 DIAGNOSIS — M1610 Unilateral primary osteoarthritis, unspecified hip: Secondary | ICD-10-CM | POA: Diagnosis not present

## 2018-08-23 DIAGNOSIS — Z7982 Long term (current) use of aspirin: Secondary | ICD-10-CM | POA: Diagnosis not present

## 2018-08-23 HISTORY — PX: QUADRICEPS TENDON REPAIR: SHX756

## 2018-08-23 LAB — TYPE AND SCREEN
ABO/RH(D): A POS
Antibody Screen: NEGATIVE

## 2018-08-23 SURGERY — REPAIR, TENDON, QUADRICEPS
Anesthesia: Spinal | Laterality: Right

## 2018-08-23 MED ORDER — HYDROMORPHONE HCL 1 MG/ML IJ SOLN: 0.2500 mg | INTRAMUSCULAR | Status: DC | PRN

## 2018-08-23 MED ORDER — ASPIRIN EC 325 MG PO TBEC
325.0000 mg | DELAYED_RELEASE_TABLET | Freq: Every day | ORAL | Status: DC
  Administered 2018-08-24: 325 mg via ORAL
  Filled 2018-08-23: qty 1

## 2018-08-23 MED ORDER — ONDANSETRON HCL 4 MG PO TABS: 4.0000 mg | ORAL_TABLET | Freq: Four times a day (QID) | ORAL | Status: DC | PRN

## 2018-08-23 MED ORDER — MEPERIDINE HCL 50 MG/ML IJ SOLN: 6.2500 mg | INTRAMUSCULAR | Status: DC | PRN

## 2018-08-23 MED ORDER — FENTANYL CITRATE (PF) 100 MCG/2ML IJ SOLN
INTRAMUSCULAR | Status: DC | PRN
  Administered 2018-08-23: 25 ug via INTRAVENOUS

## 2018-08-23 MED ORDER — CHLORHEXIDINE GLUCONATE 4 % EX LIQD
60.0000 mL | Freq: Once | CUTANEOUS | Status: AC
  Administered 2018-08-23: 4 via TOPICAL

## 2018-08-23 MED ORDER — SODIUM CHLORIDE 0.9 % IV SOLN
INTRAVENOUS | Status: DC
  Administered 2018-08-23: 18:00:00 via INTRAVENOUS

## 2018-08-23 MED ORDER — PROPOFOL 10 MG/ML IV BOLUS
INTRAVENOUS | Status: DC | PRN
  Administered 2018-08-23: 20 mg via INTRAVENOUS

## 2018-08-23 MED ORDER — POVIDONE-IODINE 10 % EX SWAB
2.0000 "application " | Freq: Once | CUTANEOUS | Status: AC
  Administered 2018-08-23: 2 via TOPICAL

## 2018-08-23 MED ORDER — 0.9 % SODIUM CHLORIDE (POUR BTL) OPTIME
TOPICAL | Status: DC | PRN
  Administered 2018-08-23: 15:00:00 1000 mL

## 2018-08-23 MED ORDER — ONDANSETRON HCL 4 MG/2ML IJ SOLN: 4.0000 mg | Freq: Once | INTRAMUSCULAR | Status: DC | PRN

## 2018-08-23 MED ORDER — CEFAZOLIN SODIUM-DEXTROSE 2-4 GM/100ML-% IV SOLN
2.0000 g | Freq: Four times a day (QID) | INTRAVENOUS | Status: AC
  Administered 2018-08-23 – 2018-08-24 (×3): 2 g via INTRAVENOUS
  Filled 2018-08-23 (×3): qty 100

## 2018-08-23 MED ORDER — TRAMADOL HCL 50 MG PO TABS: 100.0000 mg | ORAL_TABLET | Freq: Two times a day (BID) | ORAL | Status: DC | PRN

## 2018-08-23 MED ORDER — METOCLOPRAMIDE HCL 5 MG/ML IJ SOLN: 5.0000 mg | Freq: Three times a day (TID) | INTRAMUSCULAR | Status: DC | PRN

## 2018-08-23 MED ORDER — METHOCARBAMOL 500 MG IVPB - SIMPLE MED
500.0000 mg | Freq: Four times a day (QID) | INTRAVENOUS | Status: DC | PRN
  Filled 2018-08-23 (×2): qty 50

## 2018-08-23 MED ORDER — ROSUVASTATIN CALCIUM 5 MG PO TABS
5.0000 mg | ORAL_TABLET | Freq: Every day | ORAL | Status: DC
  Administered 2018-08-24: 5 mg via ORAL
  Filled 2018-08-23: qty 1

## 2018-08-23 MED ORDER — MIDAZOLAM HCL 2 MG/2ML IJ SOLN
1.0000 mg | INTRAMUSCULAR | Status: DC
  Filled 2018-08-23: qty 2

## 2018-08-23 MED ORDER — ONDANSETRON HCL 4 MG/2ML IJ SOLN: 4.0000 mg | Freq: Four times a day (QID) | INTRAMUSCULAR | Status: DC | PRN

## 2018-08-23 MED ORDER — DOCUSATE SODIUM 100 MG PO CAPS
100.0000 mg | ORAL_CAPSULE | Freq: Two times a day (BID) | ORAL | Status: DC
  Filled 2018-08-23 (×2): qty 1

## 2018-08-23 MED ORDER — ONDANSETRON HCL 4 MG/2ML IJ SOLN
INTRAMUSCULAR | Status: DC | PRN
  Administered 2018-08-23: 4 mg via INTRAVENOUS

## 2018-08-23 MED ORDER — LACTATED RINGERS IV SOLN
INTRAVENOUS | Status: DC
  Administered 2018-08-23: 12:00:00 via INTRAVENOUS

## 2018-08-23 MED ORDER — TRAMADOL HCL 50 MG PO TABS: 50.0000 mg | ORAL_TABLET | Freq: Four times a day (QID) | ORAL | Status: DC | PRN

## 2018-08-23 MED ORDER — METHOCARBAMOL 500 MG PO TABS
500.0000 mg | ORAL_TABLET | Freq: Four times a day (QID) | ORAL | Status: DC | PRN
  Administered 2018-08-24: 500 mg via ORAL
  Filled 2018-08-23: qty 1

## 2018-08-23 MED ORDER — AMLODIPINE BESYLATE 5 MG PO TABS
2.5000 mg | ORAL_TABLET | Freq: Every day | ORAL | Status: DC
  Administered 2018-08-24: 2.5 mg via ORAL
  Filled 2018-08-23: qty 1

## 2018-08-23 MED ORDER — CEFAZOLIN SODIUM-DEXTROSE 2-4 GM/100ML-% IV SOLN
2.0000 g | INTRAVENOUS | Status: AC
  Administered 2018-08-23: 14:00:00 2 g via INTRAVENOUS
  Filled 2018-08-23: qty 100

## 2018-08-23 MED ORDER — HYDROCODONE-ACETAMINOPHEN 5-325 MG PO TABS
1.0000 | ORAL_TABLET | ORAL | Status: DC | PRN
  Administered 2018-08-23 – 2018-08-24 (×3): 1 via ORAL
  Filled 2018-08-23 (×3): qty 1

## 2018-08-23 MED ORDER — PROPOFOL 500 MG/50ML IV EMUL
INTRAVENOUS | Status: DC | PRN
  Administered 2018-08-23: 25 ug/kg/min via INTRAVENOUS

## 2018-08-23 MED ORDER — DEXAMETHASONE SODIUM PHOSPHATE 10 MG/ML IJ SOLN
8.0000 mg | Freq: Once | INTRAMUSCULAR | Status: AC
  Administered 2018-08-23: 4 mg via INTRAVENOUS

## 2018-08-23 MED ORDER — FINASTERIDE 5 MG PO TABS
5.0000 mg | ORAL_TABLET | Freq: Every day | ORAL | Status: DC
  Administered 2018-08-24: 5 mg via ORAL
  Filled 2018-08-23: qty 1

## 2018-08-23 MED ORDER — STERILE WATER FOR IRRIGATION IR SOLN
Status: DC | PRN
  Administered 2018-08-23: 2000 mL

## 2018-08-23 MED ORDER — FENTANYL CITRATE (PF) 100 MCG/2ML IJ SOLN
50.0000 ug | INTRAMUSCULAR | Status: DC
  Administered 2018-08-23: 50 ug via INTRAVENOUS
  Filled 2018-08-23: qty 2

## 2018-08-23 MED ORDER — FENTANYL CITRATE (PF) 100 MCG/2ML IJ SOLN
INTRAMUSCULAR | Status: AC
  Filled 2018-08-23: qty 2

## 2018-08-23 MED ORDER — METOCLOPRAMIDE HCL 5 MG PO TABS: 5.0000 mg | ORAL_TABLET | Freq: Three times a day (TID) | ORAL | Status: DC | PRN

## 2018-08-23 MED ORDER — CHLORTHALIDONE 25 MG PO TABS
12.5000 mg | ORAL_TABLET | Freq: Every day | ORAL | Status: DC
  Administered 2018-08-24: 12.5 mg via ORAL
  Filled 2018-08-23: qty 1

## 2018-08-23 SURGICAL SUPPLY — 47 items
BAG SPEC THK2 15X12 ZIP CLS (MISCELLANEOUS) ×1
BAG ZIPLOCK 12X15 (MISCELLANEOUS) ×3 IMPLANT
BANDAGE ACE 6X5 VEL STRL LF (GAUZE/BANDAGES/DRESSINGS) ×5 IMPLANT
BIT DRILL 2.8X5 QR DISP (BIT) ×3 IMPLANT
BLADE MIC 41X13 (BLADE) ×2 IMPLANT
BLADE MIC 41X13MM (BLADE) ×1
CLOSURE WOUND 1/2 X4 (GAUZE/BANDAGES/DRESSINGS) ×2
CLOTH BEACON ORANGE TIMEOUT ST (SAFETY) ×3 IMPLANT
COVER SURGICAL LIGHT HANDLE (MISCELLANEOUS) ×3 IMPLANT
COVER WAND RF STERILE (DRAPES) IMPLANT
DRAPE U-SHAPE 47X51 STRL (DRAPES) ×3 IMPLANT
DRSG ADAPTIC 3X8 NADH LF (GAUZE/BANDAGES/DRESSINGS) ×3 IMPLANT
DRSG EMULSION OIL 3X3 NADH (GAUZE/BANDAGES/DRESSINGS) ×3 IMPLANT
DRSG PAD ABDOMINAL 8X10 ST (GAUZE/BANDAGES/DRESSINGS) ×3 IMPLANT
DURAPREP 26ML APPLICATOR (WOUND CARE) ×3 IMPLANT
ELECT REM PT RETURN 15FT ADLT (MISCELLANEOUS) ×3 IMPLANT
GAUZE SPONGE 4X4 12PLY STRL (GAUZE/BANDAGES/DRESSINGS) ×3 IMPLANT
GLOVE BIO SURGEON STRL SZ7.5 (GLOVE) ×3 IMPLANT
GLOVE BIO SURGEON STRL SZ8 (GLOVE) ×3 IMPLANT
GLOVE BIOGEL PI IND STRL 7.0 (GLOVE) ×1 IMPLANT
GLOVE BIOGEL PI IND STRL 8 (GLOVE) ×3 IMPLANT
GLOVE BIOGEL PI INDICATOR 7.0 (GLOVE) ×2
GLOVE BIOGEL PI INDICATOR 8 (GLOVE) ×6
GLOVE SURG SS PI 7.0 STRL IVOR (GLOVE) ×3 IMPLANT
GOWN STRL REUS W/TWL LRG LVL3 (GOWN DISPOSABLE) ×9 IMPLANT
IMMOBILIZER KNEE 20 (SOFTGOODS) ×3
IMMOBILIZER KNEE 20 THIGH 36 (SOFTGOODS) ×1 IMPLANT
KIT TURNOVER KIT A (KITS) ×2 IMPLANT
MANIFOLD NEPTUNE II (INSTRUMENTS) ×3 IMPLANT
NDL MA TROC 1/2 CIR (NEEDLE) ×1 IMPLANT
NEEDLE MA TROC 1/2 CIR (NEEDLE) ×3 IMPLANT
PACK ICE MAXI GEL EZY WRAP (MISCELLANEOUS) ×1 IMPLANT
PACK TOTAL KNEE CUSTOM (KITS) ×6 IMPLANT
PADDING CAST COTTON 6X4 STRL (CAST SUPPLIES) ×6 IMPLANT
PASSER SUT SWANSON 36MM LOOP (INSTRUMENTS) ×3 IMPLANT
PROTECTOR NERVE ULNAR (MISCELLANEOUS) ×3 IMPLANT
STRIP CLOSURE SKIN 1/2X4 (GAUZE/BANDAGES/DRESSINGS) ×4 IMPLANT
SUT ETHIBOND 2 OS 4 DA (SUTURE) IMPLANT
SUT ETHIBOND NAB CT1 #1 30IN (SUTURE) ×4 IMPLANT
SUT FIBERWIRE #2 38 T-5 BLUE (SUTURE) ×9
SUT MNCRL AB 4-0 PS2 18 (SUTURE) ×3 IMPLANT
SUT VIC AB 0 CT1 27 (SUTURE) ×3
SUT VIC AB 0 CT1 27XBRD ANTBC (SUTURE) ×2 IMPLANT
SUT VIC AB 2-0 CT1 27 (SUTURE) ×12
SUT VIC AB 2-0 CT1 TAPERPNT 27 (SUTURE) ×2 IMPLANT
SUTURE FIBERWR #2 38 T-5 BLUE (SUTURE) ×2 IMPLANT
WRAP KNEE MAXI GEL POST OP (GAUZE/BANDAGES/DRESSINGS) ×2 IMPLANT

## 2018-08-23 NOTE — Interval H&P Note (Signed)
History and Physical Interval Note:  08/23/2018 2:06 PM  [name].  has presented today for surgery, with the diagnosis of right quad tendon rupture.  The various methods of treatment have been discussed with the patient and family. After consideration of risks, benefits and other options for treatment, the patient has consented to  Procedure(s) with comments: REPAIR QUADRICEP TENDON (Right) - 60min as a surgical intervention.  The patient's history has been reviewed, patient examined, no change in status, stable for surgery.  I have reviewed the patient's chart and labs.  Questions were answered to the patient's satisfaction.     Pilar Plate Sherlon Nied

## 2018-08-23 NOTE — Anesthesia Procedure Notes (Signed)
Anesthesia Regional Block: Adductor canal block   Pre-Anesthetic Checklist: ,, timeout performed, Correct Patient, Correct Site, Correct Laterality, Correct Procedure, Correct Position, site marked, Risks and benefits discussed,  Surgical consent,  Pre-op evaluation,  At surgeon's request and post-op pain management  Laterality: Right  Prep: chloraprep       Needles:  Injection technique: Single-shot  Needle Type: Echogenic Stimulator Needle     Needle Length: 9cm  Needle Gauge: 21     Additional Needles:   Narrative:  Start time: 08/23/2018 12:41 PM End time: 08/23/2018 12:51 PM Injection made incrementally with aspirations every 5 mL.  Performed by: Personally  Anesthesiologist: Lillia Abed, MD  Additional Notes: Monitors applied. Patient sedated. Sterile prep and drape,hand hygiene and sterile gloves were used. Relevant anatomy identified.Needle position confirmed.Local anesthetic injected incrementally after negative aspiration. Local anesthetic spread visualized around nerve(s). Vascular puncture avoided. No complications. Image printed for medical record.The patient tolerated the procedure well.    Lillia Abed MD

## 2018-08-23 NOTE — Transfer of Care (Signed)
Immediate Anesthesia Transfer of Care Note  Patient: [name].  Procedure(s) Performed: REPAIR QUADRICEP TENDON (Right )  Patient Location: PACU  Anesthesia Type:Spinal  Level of Consciousness: awake, alert  and oriented  Airway & Oxygen Therapy: Patient Spontanous Breathing and Patient connected to face mask oxygen  Post-op Assessment: Report given to RN and Post -op Vital signs reviewed and stable  Post vital signs: Reviewed and stable  Last Vitals:  Vitals Value Taken Time  BP    Temp    Pulse 56 08/23/18 1555  Resp 15 08/23/18 1555  SpO2 100 % 08/23/18 1555  Vitals shown include unvalidated device data.  Last Pain:  Vitals:   08/23/18 1225  TempSrc:   PainSc: 0-No pain         Complications: No apparent anesthesia complications

## 2018-08-23 NOTE — Op Note (Signed)
NAME: [name], [name] MEDICAL RECORD [id number] ACCOUNT [id number] DATE OF BIRTH:June 27, 1924 FACILITY: [facility] LOCATION: [id number] PHYSICIAN:Albana Saperstein Zella Ball, MD  OPERATIVE REPORT  DATE OF PROCEDURE:  08/23/2018  PREOPERATIVE DIAGNOSIS:  Right quadriceps tendon rupture.  POSTOPERATIVE DIAGNOSIS:  Right quadriceps tendon rupture.  PROCEDURE:  Right quadriceps tendon repair.  SURGEON:  Gaynelle Arabian, MD  ASSISTANT:  Griffith Citron, PA-C  ANESTHESIA:  Adductor canal block and spinal.  ESTIMATED BLOOD LOSS:  Minimal.  DRAIN:  None.  TOURNIQUET TIME:  35 minutes at 300 mmHg.  COMPLICATIONS:  None.  CONDITION:  Stable to recovery.  BRIEF CLINICAL NOTE:  The patient is a 85 year old male who had injury to his right knee approximately 3 weeks ago playing tennis.  He was seen in the emergency room and then the office and noted to have a quadriceps tendon rupture.  He presents now for  repair.  PROCEDURE IN DETAIL:  After successful administration of adductor canal block and then spinal anesthetic, a tourniquet was placed high on his right thigh, and his right lower extremity was prepped and draped in the usual sterile fashion.  The extremity  was wrapped in an Esmarch and tourniquet inflated to 300 mmHg.  A midline incision was made with a 10 blade through subcutaneous tissue.  He had an obvious significant tear of his quad tendon with essentially all 4 tendons ripped off of the superior  aspect of the patella.  He also tore the medial and lateral retinaculum.  The knee was thoroughly irrigated with saline solution to evacuate the hematoma.  We then found the edges of the tendon and placed a #2 FiberWire suture starting about 3 cm above  the level of the tear and then coursing distally through the free edge of the tendon, utilizing a Bunn[place] B type suture.  We did this for the medial half, and then a second suture was used for the lateral half of the tendon.  It was subsequently passed   through the ruptured portion distally.  Three holes were then drilled in the patella, and the sutures were passed through those holes.  The sutures were then tied distally, leading to a very stable repair of this tendon.  I flexed the knee 90 degrees and  the repair remained intact.  I then thoroughly irrigated the joint again and closed the medial and lateral retinaculum with interrupted #1 Ethibond suture.  We then oversewed the superficial layer of the repair with the Ethibond.  Tourniquet was then  released.  Total time of 35 minutes.  Subcu was then closed with interrupted 2-0 Vicryl and subcuticular running 4-0 Monocryl.  Incisions were cleaned and dried, and Steri-Strips and a bulky sterile dressing were applied.  He was then placed into a knee  immobilizer, awakened and transported to recovery in stable condition.  LN/NUANCE  D:08/23/2018 T:08/23/2018 JOB:[id number]

## 2018-08-23 NOTE — Brief Op Note (Signed)
08/23/2018  3:19 PM  PATIENT:  Yetta Glassman.  85 y.o. male  PRE-OPERATIVE DIAGNOSIS:  right quad tendon rupture  POST-OPERATIVE DIAGNOSIS:  right quad tendon rupture  PROCEDURE:  Procedure(s) with comments: REPAIR QUADRICEP TENDON (Right) - 53min  SURGEON:  Surgeon(s) and Role:    Gaynelle Arabian, MD - Primary  PHYSICIAN ASSISTANT:   ASSISTANTS: Griffith Citron, PA-C   ANESTHESIA:   regional and spinal  EBL:  10 ml  BLOOD ADMINISTERED:none  DRAINS: none   LOCAL MEDICATIONS USED:  NONE  COUNTS:  YES  TOURNIQUET:   Total Tourniquet Time Documented: Thigh (Right) - 35 minutes Total: Thigh (Right) - 35 minutes   DICTATION: .Other Dictation: Dictation Number [id number]  PLAN OF CARE: Admit for overnight observation  PATIENT DISPOSITION:  PACU - hemodynamically stable.

## 2018-08-23 NOTE — Discharge Instructions (Signed)
Dr. Gaynelle Arabian Total Joint Specialist [facility] [address], [place], [place] [address] [phone]  POSTOPERATIVE DIRECTIONS  HOME CARE INSTRUCTIONS   Keep knee immobilizer on right leg until your follow-up appointment at the office. No range of motion of the right knee, as discussed with the physical therapist.   Remove items at home which could result in a fall. This includes throw rugs or furniture in walking pathways.   ICE to the affected knee every three hours for 30 minutes at a time and then as needed for pain and swelling.  Continue to use ice on the knee for pain and swelling from surgery. You may notice swelling that will progress down to the foot and ankle.  This is normal after surgery.  Elevate the leg when you are not up walking on it.    Continue to use the breathing machine which will help keep your temperature down.  It is common for your temperature to cycle up and down following surgery, especially at night when you are not up moving around and exerting yourself.  The breathing machine keeps your lungs expanded and your temperature down.  Do not place pillow under knee, focus on keeping the knee straight while resting  DIET You may resume your previous home diet once your are discharged from the hospital.  DRESSING / WOUND CARE / SHOWERING You may change your dressing 3-5 days after surgery.  Then change the dressing every day with sterile gauze.  Please use good hand washing techniques before changing the dressing.  Do not use any lotions or creams on the incision until instructed by your surgeon. You may start showering once you are discharged home but do not submerge the incision under water. Just pat the incision dry and apply a dry gauze dressing on daily. Change the surgical dressing daily and reapply a dry dressing each time.  WEIGHT BEARING Weight bearing as tolerated with assist device (walker, cane, etc) as directed, use it as  long as suggested by your surgeon or therapist, typically at least 4-6 weeks.  POSTOPERATIVE CONSTIPATION PROTOCOL Constipation - defined medically as fewer than three stools per week and severe constipation as less than one stool per week.  One of the most common issues patients have following surgery is constipation.  Even if you have a regular bowel pattern at home, your normal regimen is likely to be disrupted due to multiple reasons following surgery.  Combination of anesthesia, postoperative narcotics, change in appetite and fluid intake all can affect your bowels.  In order to avoid complications following surgery, here are some recommendations in order to help you during your recovery period.  Colace (docusate) - Pick up an over-the-counter form of Colace or another stool softener and take twice a day as long as you are requiring postoperative pain medications.  Take with a full glass of water daily.  If you experience loose stools or diarrhea, hold the colace until you stool forms back up.  If your symptoms do not get better within 1 week or if they get worse, check with your doctor.  Dulcolax (bisacodyl) - Pick up over-the-counter and take as directed by the product packaging as needed to assist with the movement of your bowels.  Take with a full glass of water.  Use this product as needed if not relieved by Colace only.   MiraLax (polyethylene glycol) - Pick up over-the-counter to have on hand.  MiraLax is a solution that will increase  the amount of water in your bowels to assist with bowel movements.  Take as directed and can mix with a glass of water, juice, soda, coffee, or tea.  Take if you go more than two days without a movement. Do not use MiraLax more than once per day. Call your doctor if you are still constipated or irregular after using this medication for 7 days in a row.  If you continue to have problems with postoperative constipation, please contact the office for further  assistance and recommendations.  If you experience "the worst abdominal pain ever" or develop nausea or vomiting, please contact the office immediatly for further recommendations for treatment.  ITCHING If you experience itching with your medications, try taking only a single pain pill, or even half a pain pill at a time.  You can also use Benadryl over the counter for itching or also to help with sleep.   TED HOSE STOCKINGS Wear the elastic stockings on both legs for three weeks following surgery during the day but you may remove then at night for sleeping.  MEDICATIONS See your medication summary on the After Visit Summary that the nursing staff will review with you prior to discharge.  You may have some home medications which will be placed on hold until you complete the course of blood thinner medication.  It is important for you to complete the blood thinner medication as prescribed by your surgeon.  Continue your approved medications as instructed at time of discharge.  PRECAUTIONS If you experience chest pain or shortness of breath - call 911 immediately for transfer to the hospital emergency department.  If you develop a fever greater that 101 F, purulent drainage from wound, increased redness or drainage from wound, foul odor from the wound/dressing, or calf pain - CONTACT YOUR SURGEON.                                                   FOLLOW-UP APPOINTMENTS Make sure you keep all of your appointments after your operation with your surgeon and caregivers. You should call the office at the above phone number and make an appointment for approximately two weeks after the date of your surgery or on the date instructed by your surgeon outlined in the "After Visit Summary".  MAKE SURE YOU:   Understand these instructions.   Get help right away if you are not doing well or get worse.    Pick up stool softner and laxative for home use following surgery while on pain medications. Do not  submerge incision under water. Please use good hand washing techniques while changing dressing each day. May shower starting three days after surgery. Please use a clean towel to pat the incision dry following showers. Continue to use ice for pain and swelling after surgery. Do not use any lotions or creams on the incision until instructed by your surgeon.

## 2018-08-23 NOTE — Anesthesia Preprocedure Evaluation (Signed)
Anesthesia Evaluation  Patient identified by MRN, date of birth, ID band Patient awake    Reviewed: Allergy & Precautions, NPO status , Patient's Chart, lab work & pertinent test results  Airway Mallampati: I  TM Distance: >3 FB Neck ROM: Full    Dental   Pulmonary    Pulmonary exam normal        Cardiovascular Normal cardiovascular exam     Neuro/Psych    GI/Hepatic   Endo/Other    Renal/GU      Musculoskeletal   Abdominal   Peds  Hematology   Anesthesia Other Findings   Reproductive/Obstetrics                             Anesthesia Physical Anesthesia Plan  ASA: II  Anesthesia Plan: Spinal   Post-op Pain Management:  Regional for Post-op pain   Induction: Intravenous  PONV Risk Score and Plan: 1 and Ondansetron  Airway Management Planned: Simple Face Mask  Additional Equipment:   Intra-op Plan:   Post-operative Plan:   Informed Consent: I have reviewed the patients History and Physical, chart, labs and discussed the procedure including the risks, benefits and alternatives for the proposed anesthesia with the patient or authorized representative who has indicated his/her understanding and acceptance.       Plan Discussed with: CRNA and Surgeon  Anesthesia Plan Comments:         Anesthesia Quick Evaluation

## 2018-08-23 NOTE — Plan of Care (Signed)
Discussed plan of care with pt.

## 2018-08-23 NOTE — Progress Notes (Signed)
AssistedDr. Ossey with right, ultrasound guided, adductor canal block. Side rails up, monitors on throughout procedure. See vital signs in flow sheet. Tolerated Procedure well.  

## 2018-08-23 NOTE — H&P (Signed)
CC- [name]. is a 85 y.o. male who presents with right knee pain.  HPI- . Knee Pain: Patient presents with a knee injury involving the  right knee. Onset of the symptoms was approximately 2 weeks ago. Inciting event: He was playing tennis on his birthday and felt a pop in his right knee when he hit the ball. He developed immideiate pain and swelling and was unable to prevent his knee from buckling. He was seen in the ED and then in my office and diagnosed with a quadriceps tendon tear. He presents now for quad tendon repair.  Past Medical History:  Diagnosis Date  . Anemia   . Arthritis   . Cataract   . Complication following bilateral mastoidectomy   . Dysphagia    with large pills  . Gout   . Wrist fracture    left    Past Surgical History:  Procedure Laterality Date  . cataracts    . MASTOIDECTOMY REVISION Bilateral years ago  . ORIF WRIST FRACTURE Left 1975  . TOTAL HIP ARTHROPLASTY Left 05/05/2016   Procedure: LEFT TOTAL HIP ARTHROPLASTY ANTERIOR APPROACH;  Surgeon: Gaynelle Arabian, MD;  Location: [facility];  Service: Orthopedics;  Laterality: Left;    Prior to Admission medications   Medication Sig Start Date End Date Taking? Authorizing Provider  amLODipine (NORVASC) 2.5 MG tablet Take 2.5 mg by mouth daily.   Yes [provider]  aspirin EC 81 MG tablet Take 81 mg by mouth daily.   Yes [provider]  Calcium-Vitamin D-Vitamin K (VIACTIV CALCIUM PLUS D) 650-12.5-40 MG-MCG-MCG CHEW Chew 1 each by mouth daily.   Yes [provider]  chlorthalidone (HYGROTON) 25 MG tablet Take 12.5 mg by mouth daily.   Yes [provider]  Cholecalciferol (VITAMIN D) 50 MCG (2000 UT) tablet Take 2,000 Units by mouth daily.   Yes [provider]  Cyanocobalamin (B-12) 2500 MCG TABS Take 2,500 mcg by mouth daily.   Yes [provider]  finasteride (PROSCAR) 5 MG tablet Take 5 mg by mouth daily.   Yes [provider]  NON FORMULARY  Take 1 tablet by mouth daily. Vision Vite - 1 tablet daily     Yes [provider]  polyethylene glycol (MIRALAX / GLYCOLAX) packet Take 17 g by mouth daily as needed for mild constipation.    Yes [provider]  rosuvastatin (CRESTOR) 5 MG tablet Take 5 mg by mouth daily.   Yes [provider]  Wheat Dextrin (BENEFIBER) POWD Take 10 mLs by mouth daily. 2 tsp Daily   Yes [provider]  traMADol (ULTRAM) 50 MG tablet Take 1-2 tablets (50-100 mg total) by mouth every 6 (six) hours as needed for moderate pain. 05/07/16   Perkins, Alexzandrew L, PA-C    antalgic gait, effusion, reduced range of motion, collateral ligaments intact, negative Lachman sign, palpable defect superior to the patella, unable to actively extend the right knee  Physical Examination: General appearance - alert, well appearing, and in no distress Mental status - alert, oriented to person, place, and time Chest - clear to auscultation, no wheezes, rales or rhonchi, symmetric air entry Heart - normal rate, regular rhythm, normal S1, S2, no murmurs, rubs, clicks or gallops Abdomen - soft, nontender, nondistended, no masses or organomegaly Neurological - alert, oriented, normal speech, no focal findings or movement disorder noted   Asessment/Plan--- Right quad tendon tear- - Plan right quad tendon repair.  Procedure risks and potential comps  discussed with patient who elects to proceed. Goals are decreased pain and increased function with a high likelihood of achieving both

## 2018-08-23 NOTE — Anesthesia Postprocedure Evaluation (Signed)
Anesthesia Post Note  Patient: [name].  Procedure(s) Performed: REPAIR QUADRICEP TENDON (Right )     Patient location during evaluation: PACU Anesthesia Type: Spinal Level of consciousness: oriented and awake and alert Pain management: pain level controlled Vital Signs Assessment: post-procedure vital signs reviewed and stable Respiratory status: spontaneous breathing, respiratory function stable and patient connected to nasal cannula oxygen Cardiovascular status: blood pressure returned to baseline and stable Postop Assessment: no headache, no backache and no apparent nausea or vomiting Anesthetic complications: no    Last Vitals:  Vitals:   08/23/18 1645 08/23/18 1702  BP: (!) 163/76 (!) 150/76  Pulse: 78 60  Resp: 18 16  Temp: 36.8 C 36.6 C  SpO2: 100% 98%    Last Pain:  Vitals:   08/23/18 1702  TempSrc: Oral  PainSc:                  Bennie Scaff DAVID

## 2018-08-24 ENCOUNTER — Encounter (HOSPITAL_COMMUNITY): Payer: Self-pay | Admitting: Orthopedic Surgery

## 2018-08-24 DIAGNOSIS — S76111A Strain of right quadriceps muscle, fascia and tendon, initial encounter: Secondary | ICD-10-CM | POA: Diagnosis not present

## 2018-08-24 DIAGNOSIS — Z79899 Other long term (current) drug therapy: Secondary | ICD-10-CM | POA: Diagnosis not present

## 2018-08-24 DIAGNOSIS — Z96642 Presence of left artificial hip joint: Secondary | ICD-10-CM | POA: Diagnosis not present

## 2018-08-24 DIAGNOSIS — Z7982 Long term (current) use of aspirin: Secondary | ICD-10-CM | POA: Diagnosis not present

## 2018-08-24 DIAGNOSIS — M1990 Unspecified osteoarthritis, unspecified site: Secondary | ICD-10-CM | POA: Diagnosis not present

## 2018-08-24 DIAGNOSIS — Z1159 Encounter for screening for other viral diseases: Secondary | ICD-10-CM | POA: Diagnosis not present

## 2018-08-24 MED ORDER — ASPIRIN 325 MG PO TBEC: 325.0000 mg | DELAYED_RELEASE_TABLET | Freq: Every day | ORAL | 0 refills | Status: AC

## 2018-08-24 MED ORDER — METHOCARBAMOL 500 MG PO TABS: 500.0000 mg | ORAL_TABLET | Freq: Four times a day (QID) | ORAL | 0 refills | Status: DC | PRN

## 2018-08-24 MED ORDER — HYDROCODONE-ACETAMINOPHEN 5-325 MG PO TABS: 1.0000 | ORAL_TABLET | Freq: Four times a day (QID) | ORAL | 0 refills | Status: DC | PRN

## 2018-08-24 NOTE — Care Management Obs Status (Signed)
[place] NOTIFICATION   Patient Details  Name: [name]. MRN: [id number] Date of Birth: [birth year]   Medicare Observation Status Notification Given:  Yes    Leeroy Cha, RN 08/24/2018, 9:29 AM

## 2018-08-24 NOTE — Care Management CC44 (Signed)
Condition Code 44 Documentation Completed  Patient Details  Name: [name]. MRN: [id number] Date of Birth: 1924-03-29   Condition Code 44 given:  Yes Patient signature on Condition Code 44 notice:  Yes Documentation of 2 MD's agreement:  Yes Code 44 added to claim:  Yes    Leeroy Cha, RN 08/24/2018, 9:29 AM

## 2018-08-24 NOTE — TOC Transition Note (Signed)
Transition of Care [facility]) - CM/SW Discharge Note   Patient Details  Name: [name]. MRN: [id number] Date of Birth: [birth year]  [facility] CM/SW Contact:  Leeroy Cha, RN Phone Number: 08/24/2018, 8:31 AM   Clinical Narrative:    hhc pt through [facility]   Final next level of care: [place] Barriers to Discharge: No Barriers Identified   Patient Goals and CMS Choice Patient states their goals for this hospitalization and ongoing recovery are:: go home and get moving CMS Medicare.gov Compare Post Acute Care list provided to:: Patient Choice offered to / list presented to : Patient  Discharge Placement                       Discharge Plan and Services                DME Arranged: Walker rolling, 3-N-1 DME Agency: [employer] Date DME Agency Contacted: 08/24/18 Time DME Agency Contacted: 0830 Representative spoke with at DME Agency: [facility] HH Arranged: PT [place]: [facility] [employer] Date [place]: 08/24/18 Time [place]: [phone] Representative spoke with at [place]: [place] ([place]) Interventions     Readmission Risk Interventions No flowsheet data found.

## 2018-08-24 NOTE — Evaluation (Signed)
Physical Therapy Evaluation Patient Details Name: [name]. MRN: [id number] DOB: 1925-02-13 Today's Date: 08/24/2018   History of Present Illness  Pt s/p R quad tendon repair and with hx of L THR  Clinical Impression  Pt s/p R quad tendon repair and presents with R LE pain and NO ROM at R knee limiting functional mobility.  Pt's caregiver dtr and son-in-law on speaker phone for entire session and reviewed KI and pt's current restrictions.  Dtr reports familiar with brace as father has had one since injury several weeks ago.    Follow Up Recommendations Follow surgeon's recommendation for DC plan and follow-up therapies    Equipment Recommendations  None recommended by PT    Recommendations for Other Services       Precautions / Restrictions Precautions Precautions: Fall;Other (comment) Precaution Comments: NO flexion R knee Required Braces or Orthoses: Knee Immobilizer - Right Knee Immobilizer - Right: On at all times Restrictions Weight Bearing Restrictions: Yes RLE Weight Bearing: Weight bearing as tolerated Other Position/Activity Restrictions: with KI in place      Mobility  Bed Mobility Overal bed mobility: Needs Assistance Bed Mobility: Supine to Sit     Supine to sit: Min assist     General bed mobility comments: min assist for R LE  Transfers Overall transfer level: Needs assistance   Transfers: Sit to/from Stand Sit to Stand: Min guard         General transfer comment: cues for LE management and use of UEs to self assist  Ambulation/Gait Ambulation/Gait assistance: Min guard Gait Distance (Feet): 75 Feet Assistive device: Rolling walker (2 wheeled) Gait Pattern/deviations: Step-to pattern;Trunk flexed;Decreased step length - right;Decreased step length - left Gait velocity: decr   General Gait Details: cues for sequence, posture and position from [employer] Mobility    Modified Rankin (Stroke Patients  Only)       Balance Overall balance assessment: Mild deficits observed, not formally tested                                           Pertinent Vitals/Pain Pain Assessment: 0-10 Pain Score: 5  Pain Location: R knee with WB Pain Descriptors / Indicators: Aching;Sore Pain Intervention(s): Limited activity within patient's tolerance;Monitored during session;Premedicated before session;Ice applied    Home Living Family/patient expects to be discharged to:: Private residence Living Arrangements: Alone Available Help at Discharge: Family;Available 24 hours/day Type of Home: House Home Access: Ramped entrance     Home Layout: One level Home Equipment: Walker - 2 wheels;Bedside commode;Cane - single point      Prior Function Level of Independence: Independent         Comments: playing tennis when quad tendon ruptured     Hand Dominance        Extremity/Trunk Assessment   Upper Extremity Assessment Upper Extremity Assessment: Overall WFL for tasks assessed    Lower Extremity Assessment Lower Extremity Assessment: RLE deficits/detail RLE Deficits / Details: KI in place    Cervical / Trunk Assessment Cervical / Trunk Assessment: Kyphotic  Communication   Communication: HOH  Cognition Arousal/Alertness: Awake/alert Behavior During Therapy: WFL for tasks assessed/performed Overall Cognitive Status: Within Functional Limits for tasks assessed  General Comments      Exercises General Exercises - Lower Extremity Ankle Circles/Pumps: AROM;Both;15 reps;Supine   Assessment/Plan    PT Assessment Patient needs continued PT services  PT Problem List Decreased strength;Decreased range of motion;Decreased activity tolerance;Decreased balance;Decreased mobility;Decreased knowledge of use of DME;Pain       PT Treatment Interventions DME instruction;Gait training;Functional mobility  training;Therapeutic activities;Therapeutic exercise;Patient/family education    PT Goals (Current goals can be found in the Care Plan section)  Acute Rehab PT Goals Patient Stated Goal: Regain IND PT Goal Formulation: All assessment and education complete, DC therapy    Frequency 7X/week   Barriers to discharge        Co-evaluation               AM-PAC PT "6 Clicks" Mobility  Outcome Measure Help needed turning from your back to your side while in a flat bed without using bedrails?: A Little Help needed moving from lying on your back to sitting on the side of a flat bed without using bedrails?: A Little Help needed moving to and from a bed to a chair (including a wheelchair)?: A Little Help needed standing up from a chair using your arms (e.g., wheelchair or bedside chair)?: A Little Help needed to walk in hospital room?: A Little Help needed climbing 3-5 steps with a railing? : A Little 6 Click Score: 18    End of Session Equipment Utilized During Treatment: Gait belt;Right knee immobilizer Activity Tolerance: Patient tolerated treatment well Patient left: in chair;with call bell/phone within reach;with chair alarm set Nurse Communication: Mobility status PT Visit Diagnosis: Difficulty in walking, not elsewhere classified (R26.2)    Time: 0086-7619 PT Time Calculation (min) (ACUTE ONLY): 36 min   Charges:   PT Evaluation $PT Eval Low Complexity: 1 Low PT Treatments $Gait Training: 8-22 mins        [place] Pager [phone] Office [phone]   Ajee Heasley 08/24/2018, 12:04 PM

## 2018-08-24 NOTE — Progress Notes (Signed)
   Subjective: 1 Day Post-Op Procedure(s) (LRB): REPAIR QUADRICEP TENDON (Right) Patient reports pain as mild.   Patient seen in rounds by Dr. Wynelle Link. Patient is well, and has had no acute complaints or problems. States he is ready to go home. No issues overnight, denies chest pain, SOB, or calf pain. Foley catheter discontinued this AM. He will complete one session of physical therapy prior to discharge.   Objective: Vital signs in last 24 hours: Temp:  [97.4 F (36.3 C)-98.3 F (36.8 C)] 97.5 F (36.4 C) (06/25 0547) Pulse Rate:  [38-78] 64 (06/25 0547) Resp:  [9-27] 18 (06/25 0547) BP: (130-163)/(59-92) 151/59 (06/25 0547) SpO2:  [87 %-100 %] 99 % (06/25 0547) Weight:  [57.7 kg] 57.7 kg (06/24 1210)  Intake/Output from previous day:  Intake/Output Summary (Last 24 hours) at 08/24/2018 0742 Last data filed at 08/24/2018 0524 Gross per 24 hour  Intake 3082.2 ml  Output 1625 ml  Net 1457.2 ml    Labs: Recent Labs    08/21/18 1342  HGB 11.2*   Recent Labs    08/21/18 1342  WBC 5.3  RBC 3.44*  HCT 35.0*  PLT 236   Recent Labs    08/21/18 1342  NA 141  K 4.1  CL 103  CO2 28  BUN 39*  CREATININE 1.31*  GLUCOSE 122*  CALCIUM 9.6   Recent Labs    08/21/18 1342  INR 0.9    Exam: General - Patient is Alert and Oriented Extremity - Neurologically intact Neurovascular intact Sensation intact distally Dorsiflexion/Plantar flexion intact Dressing - dressing C/D/I Motor Function - intact, moving foot and toes well on exam.   Past Medical History:  Diagnosis Date  . Anemia   . Arthritis   . Cataract   . Complication following bilateral mastoidectomy   . Dysphagia    with large pills  . Gout   . Wrist fracture    left    Assessment/Plan: 1 Day Post-Op Procedure(s) (LRB): REPAIR QUADRICEP TENDON (Right) Principal Problem:   Quadriceps tendon rupture, right, subsequent encounter Active Problems:   Quadriceps tendon rupture, right, initial  encounter  Estimated body mass index is 19.92 kg/m as calculated from the following:   Height as of this encounter: 5\' 7"  (1.702 m).   Weight as of this encounter: 57.7 kg. Advance diet Up with therapy D/C IV fluids  DVT Prophylaxis - Aspirin Weight bearing as tolerated. D/C O2 and pulse ox and try on room air. Hemovac pulled without difficulty, will continue therapy.  Plan is to go Home after hospital stay. Right lower extremity to stay in knee immobilizer until follow-up appointment, no range of motion exercises to the right knee. Precautions discussed with patient.  Plan for discharge after one session of physical therapy this AM.  HHPT ordered.  Follow-up in the office in 2 weeks.   Theresa Duty, PA-C Orthopedic Surgery 08/24/2018, 7:42 AM

## 2018-08-27 DIAGNOSIS — D649 Anemia, unspecified: Secondary | ICD-10-CM | POA: Diagnosis not present

## 2018-08-27 DIAGNOSIS — S79111D Salter-Harris Type I physeal fracture of lower end of right femur, subsequent encounter for fracture with routine healing: Secondary | ICD-10-CM | POA: Diagnosis not present

## 2018-08-27 DIAGNOSIS — Z96641 Presence of right artificial hip joint: Secondary | ICD-10-CM | POA: Diagnosis not present

## 2018-08-27 DIAGNOSIS — Z9181 History of falling: Secondary | ICD-10-CM | POA: Diagnosis not present

## 2018-08-27 DIAGNOSIS — M109 Gout, unspecified: Secondary | ICD-10-CM | POA: Diagnosis not present

## 2018-08-30 DIAGNOSIS — Z96641 Presence of right artificial hip joint: Secondary | ICD-10-CM | POA: Diagnosis not present

## 2018-08-30 DIAGNOSIS — Z9181 History of falling: Secondary | ICD-10-CM | POA: Diagnosis not present

## 2018-08-30 DIAGNOSIS — S79111D Salter-Harris Type I physeal fracture of lower end of right femur, subsequent encounter for fracture with routine healing: Secondary | ICD-10-CM | POA: Diagnosis not present

## 2018-08-30 DIAGNOSIS — M109 Gout, unspecified: Secondary | ICD-10-CM | POA: Diagnosis not present

## 2018-08-30 DIAGNOSIS — D649 Anemia, unspecified: Secondary | ICD-10-CM | POA: Diagnosis not present

## 2018-08-30 NOTE — Discharge Summary (Signed)
Physician Discharge Summary   Patient ID: [name]. MRN: [id number] DOB/AGE: January 21, 1925 85 y.o.  Admit date: 08/23/2018 Discharge date: 08/24/2018  Primary Diagnosis: Right quadriceps tendon rupture   Admission Diagnoses:  Past Medical History:  Diagnosis Date  . Anemia   . Arthritis   . Cataract   . Complication following bilateral mastoidectomy   . Dysphagia    with large pills  . Gout   . Wrist fracture    left   Discharge Diagnoses:   Principal Problem:   Quadriceps tendon rupture, right, subsequent encounter Active Problems:   Quadriceps tendon rupture, right, initial encounter  Estimated body mass index is 19.92 kg/m as calculated from the following:   Height as of this encounter: 5\' 7"  (1.702 m).   Weight as of this encounter: 57.7 kg.  Procedure:  Procedure(s) (LRB): REPAIR QUADRICEP TENDON (Right)   Consults: None  HPI: The patient is a 85 year old male who had injury to his right knee approximately 3 weeks ago playing tennis.  He was seen in the emergency room and then the office and noted to have a quadriceps tendon rupture.  He presents now for repair.  Laboratory Data: Hospital Outpatient Visit on 08/21/2018  Component Date Value Ref Range Status  . aPTT 08/21/2018 30  24 - 36 seconds Final   Performed at [facility], [place] [address] [place], [place] [address]  . WBC 08/21/2018 5.3  4.0 - 10.5 K/uL Final  . RBC 08/21/2018 3.44* 4.22 - 5.81 MIL/uL Final  . Hemoglobin 08/21/2018 11.2* 13.0 - 17.0 g/dL Final  . HCT 08/21/2018 35.0* 39.0 - 52.0 % Final  . MCV 08/21/2018 101.7* 80.0 - 100.0 fL Final  . MCH 08/21/2018 32.6  26.0 - 34.0 pg Final  . MCHC 08/21/2018 32.0  30.0 - 36.0 g/dL Final  . RDW 08/21/2018 12.7  11.5 - 15.5 % Final  . Platelets 08/21/2018 236  150 - 400 K/uL Final  . nRBC 08/21/2018 0.0  0.0 - 0.2 % Final   Performed at [facility], [place] [address] [place], [place] [address]  . Sodium  08/21/2018 141  135 - 145 mmol/L Final  . Potassium 08/21/2018 4.1  3.5 - 5.1 mmol/L Final  . Chloride 08/21/2018 103  98 - 111 mmol/L Final  . CO2 08/21/2018 28  22 - 32 mmol/L Final  . Glucose, Bld 08/21/2018 122* 70 - 99 mg/dL Final  . BUN 08/21/2018 39* 8 - 23 mg/dL Final  . Creatinine, Ser 08/21/2018 1.31* 0.61 - 1.24 mg/dL Final  . Calcium 08/21/2018 9.6  8.9 - 10.3 mg/dL Final  . Total Protein 08/21/2018 7.0  6.5 - 8.1 g/dL Final  . Albumin 08/21/2018 4.0  3.5 - 5.0 g/dL Final  . AST 08/21/2018 22  15 - 41 U/L Final  . ALT 08/21/2018 17  0 - 44 U/L Final  . Alkaline Phosphatase 08/21/2018 56  38 - 126 U/L Final  . Total Bilirubin 08/21/2018 0.4  0.3 - 1.2 mg/dL Final  . GFR calc non Af Amer 08/21/2018 47* >60 mL/min Final  . GFR calc Af Amer 08/21/2018 54* >60 mL/min Final  . Anion gap 08/21/2018 10  5 - 15 Final   Performed at [facility], [place] [address] [place], [place] [address]  . Prothrombin Time 08/21/2018 12.5  11.4 - 15.2 seconds Final  . INR 08/21/2018 0.9  0.8 - 1.2 Final   Comment: (NOTE) INR goal varies based on  device and disease states. Performed at [facility], [place] [address], [place], [place] [address]   . ABO/RH(D) 08/21/2018 A POS   Final  . Antibody Screen 08/21/2018 NEG   Final  . Sample Expiration 08/21/2018 08/26/2018,2359   Final  . Extend sample reason 08/21/2018    Final                   Value:NO TRANSFUSIONS OR PREGNANCY IN THE PAST 3 MONTHS Performed at [facility], [place] [address], [place], [place] [address]   Hospital Outpatient Visit on 08/21/2018  Component Date Value Ref Range Status  . SARS Coronavirus 2 08/21/2018 NEGATIVE  NEGATIVE Final   Comment: (NOTE) If result is NEGATIVE SARS-CoV-2 target nucleic acids are NOT DETECTED. The SARS-CoV-2 RNA is generally detectable in upper and lower  respiratory specimens during the acute phase of infection. The lowest  concentration of  SARS-CoV-2 viral copies this assay can detect is 250  copies / mL. A negative result does not preclude SARS-CoV-2 infection  and should not be used as the sole basis for treatment or other  patient management decisions.  A negative result may occur with  improper specimen collection / handling, submission of specimen other  than nasopharyngeal swab, presence of viral mutation(s) within the  areas targeted by this assay, and inadequate number of viral copies  (<250 copies / mL). A negative result must be combined with clinical  observations, patient history, and epidemiological information. If result is POSITIVE SARS-CoV-2 target nucleic acids are DETECTED. The SARS-CoV-2 RNA is generally detectable in upper and lower  respiratory specimens dur                          ing the acute phase of infection.  Positive  results are indicative of active infection with SARS-CoV-2.  Clinical  correlation with patient history and other diagnostic information is  necessary to determine patient infection status.  Positive results do  not rule out bacterial infection or co-infection with other viruses. If result is PRESUMPTIVE POSTIVE SARS-CoV-2 nucleic acids MAY BE PRESENT.   A presumptive positive result was obtained on the submitted specimen  and confirmed on repeat testing.  While 2019 novel coronavirus  (SARS-CoV-2) nucleic acids may be present in the submitted sample  additional confirmatory testing may be necessary for epidemiological  and / or clinical management purposes  to differentiate between  SARS-CoV-2 and other Sarbecovirus currently known to infect humans.  If clinically indicated additional testing with an alternate test  methodology [phone]) is advised. The SARS-CoV-2 RNA is generally  detectable in upper and lower respiratory sp                          ecimens during the acute  phase of infection. The expected result is Negative. Fact Sheet for Patients:   StrictlyIdeas.no Fact Sheet for Healthcare Providers: BankingDealers.co.za This test is not yet approved or cleared by the [place] FDA and has been authorized for detection and/or diagnosis of SARS-CoV-2 by FDA under an Emergency Use Authorization (EUA).  This EUA will remain in effect (meaning this test can be used) for the duration of the COVID-19 declaration under Section 564(b)(1) of the Act, 21 U.S.C. section 360bbb-3(b)(1), unless the authorization is terminated or revoked sooner. Performed at [facility], [place] [address], [place], [place] [address]   Hospital Outpatient Visit on 08/19/2018  Component Date Value Ref Range Status  . SARS Coronavirus 2 08/19/2018 INVALID, UNABLE TO DETERMINE THE PRESENCE OF TARGET DUE TO SPECIMEN INTEGRITY. RECOLLECTION REQUESTED.* NEGATIVE Final   Comment: (NOTE) Presence or absence of SARS-CoV-2 nucleic acids cannot be determined. Repeat testing was performed on the submitted specimen and repeated Invalid results were obtained. If clinically indicated, additional testing on a new specimen with an alternate test methodology [phone]) is advised. The SARS-CoV-2 RNA is generally detectable in upper and lower respiratory specimens during the acute phase of infection. The expected result is Negative. Fact Sheet for Patients: TrashEliminator.se Fact Sheet for Healthcare Providers: WhoisBlogging.ch This test is not yet approved or cleared by the [place] FDA and  has been authorized for detection and/or diagnosis of SARS-CoV-2 by FDA under an Emergency Use Authorization (EUA). This EUA will remain  in effect (meaning this test can be used) for the duration of the COVID-19 declaration under Section 564(b)(1) of the Act, 21 U.S.C. section 360bbb-3(b)(1), unless the authorizat                          ion is terminated or revoked  sooner. Performed at [facility], [place] [address], [place], [place] [address]      X-Rays:Dg Knee Complete 4 Views Right  Result Date: 07/31/2018 CLINICAL DATA:  Knee pain.  Injured playing tennis. EXAM: RIGHT KNEE - COMPLETE 4+ VIEW COMPARISON:  None. FINDINGS: No evidence of fracture, dislocation, or joint effusion. No evidence of arthropathy or other focal bone abnormality. Soft tissues are unremarkable. Peripheral vascular atherosclerotic disease. Haziness around the distal quadriceps tendon insertion may reflect forceps tendon injury. IMPRESSION: No acute osseous injury of the right knee. Peripheral vascular atherosclerotic disease. Haziness around the distal quadriceps tendon insertion may reflect forceps tendon injury. Electronically Signed   By: Kathreen Devoid   On: 07/31/2018 16:47    EKG: Orders placed or performed in visit on 08/21/18  . EKG 12-Lead  . EKG 12-Lead  . EKG 12-Lead     Hospital Course: Braun Rocca. is a 85 y.o. who was admitted to [facility]. They were brought to the operating room on 08/23/2018 and underwent Procedure(s): REPAIR QUADRICEP TENDON.  Patient tolerated the procedure well and was later transferred to the recovery room and then to the orthopaedic floor for postoperative care. They were given PO and IV analgesics for pain control following their surgery. They were given 24 hours of postoperative antibiotics of  Anti-infectives (From admission, onward)   Start     Dose/Rate Route Frequency Ordered Stop   08/23/18 2030  ceFAZolin (ANCEF) IVPB 2g/100 mL premix     2 g 200 mL/hr over 30 Minutes Intravenous Every 6 hours 08/23/18 1658 08/24/18 0949   08/23/18 1130  ceFAZolin (ANCEF) IVPB 2g/100 mL premix     2 g 200 mL/hr over 30 Minutes Intravenous On call to O.R. 08/23/18 1121 08/23/18 1419     and started on DVT prophylaxis in the form of Aspirin.   Physical therapy was ordered and discharge planning consulted to help with postop  disposition and equipment needs.  Patient had a good night on the evening of surgery. They started to get up OOB with therapy on POD #1. Pt was seen during rounds and was ready to go home pending progress with therapy. Hemovac drain was pulled without difficulty. He worked with therapy on POD #1 and was meeting his goals. Pt was  discharged to home later that day in stable condition.  Diet: Regular diet Activity: WBAT Right lower extremity to stay in knee immobilizer until follow-up appointment No range of motion exercises to the right knee Precautions discussed with patient Follow-up: in 2 weeks Disposition: Home with HHPT Discharged Condition: stable   Discharge Instructions    Call MD / Call 911   Complete by: As directed    If you experience chest pain or shortness of breath, CALL 911 and be transported to the hospital emergency room.  If you develope a fever above 101 F, pus (white drainage) or increased drainage or redness at the wound, or calf pain, call your surgeon's office.   Change dressing   Complete by: As directed    Change dressing on Friday, then change the dressing daily with sterile 4 x 4 inch gauze dressing.   Constipation Prevention   Complete by: As directed    Drink plenty of fluids.  Prune juice may be helpful.  You may use a stool softener, such as Colace (over the counter) 100 mg twice a day.  Use MiraLax (over the counter) for constipation as needed.   Diet - low sodium heart healthy   Complete by: As directed    Weight bearing as tolerated   Complete by: As directed      Allergies as of 08/24/2018      Reactions   Other Other (See Comments)   Pt can not take gel caps - med gets stuck in throat       Medication List    TAKE these medications   amLODipine 2.5 MG tablet Commonly known as: NORVASC Take 2.5 mg by mouth daily.   aspirin 325 MG EC tablet Take 1 tablet (325 mg total) by mouth daily for 20 days. Then resume one 81 mg aspirin once a day. What  changed:   medication strength  how much to take  additional instructions   B-12 2500 MCG Tabs Take 2,500 mcg by mouth daily.   Benefiber Powd Take 10 mLs by mouth daily. 2 tsp Daily   chlorthalidone 25 MG tablet Commonly known as: HYGROTON Take 12.5 mg by mouth daily.   finasteride 5 MG tablet Commonly known as: PROSCAR Take 5 mg by mouth daily.   HYDROcodone-acetaminophen 5-325 MG tablet Commonly known as: NORCO/VICODIN Take 1 tablet by mouth every 6 (six) hours as needed for moderate pain or severe pain.   methocarbamol 500 MG tablet Commonly known as: ROBAXIN Take 1 tablet (500 mg total) by mouth every 6 (six) hours as needed for muscle spasms.   NON FORMULARY Take 1 tablet by mouth daily. Vision Vite - 1 tablet daily   polyethylene glycol 17 g packet Commonly known as: MIRALAX / GLYCOLAX Take 17 g by mouth daily as needed for mild constipation.   rosuvastatin 5 MG tablet Commonly known as: CRESTOR Take 5 mg by mouth daily.   traMADol 50 MG tablet Commonly known as: ULTRAM Take 1-2 tablets (50-100 mg total) by mouth every 6 (six) hours as needed for moderate pain.   Viactiv Calcium Plus D 650-12.5-40 MG-MCG-MCG Chew Generic drug: Calcium-Vitamin D-Vitamin K Chew 1 each by mouth daily.   Vitamin D 50 MCG (2000 UT) tablet Take 2,000 Units by mouth daily.            Discharge Care Instructions  (From admission, onward)         Start     Ordered   08/24/18 0000  Weight  bearing as tolerated     08/24/18 0746   08/24/18 0000  Change dressing    Comments: Change dressing on Friday, then change the dressing daily with sterile 4 x 4 inch gauze dressing.   08/24/18 0746         Follow-up Information    Gaynelle Arabian, MD. Schedule an appointment as soon as possible for a visit on 09/07/2018.   Specialty: Orthopedic Surgery Contact information: [address] [place] [address] [phone]        [facility] Follow up.    Specialty: [facility] Contact information: [address] [place] [address] [phone]           Signed: Theresa Duty, PA-C Orthopedic Surgery 08/30/2018, 1:56 PM

## 2018-09-05 DIAGNOSIS — Z96641 Presence of right artificial hip joint: Secondary | ICD-10-CM | POA: Diagnosis not present

## 2018-09-05 DIAGNOSIS — Z9181 History of falling: Secondary | ICD-10-CM | POA: Diagnosis not present

## 2018-09-05 DIAGNOSIS — S79111D Salter-Harris Type I physeal fracture of lower end of right femur, subsequent encounter for fracture with routine healing: Secondary | ICD-10-CM | POA: Diagnosis not present

## 2018-09-05 DIAGNOSIS — M109 Gout, unspecified: Secondary | ICD-10-CM | POA: Diagnosis not present

## 2018-09-05 DIAGNOSIS — D649 Anemia, unspecified: Secondary | ICD-10-CM | POA: Diagnosis not present

## 2018-09-13 DIAGNOSIS — D649 Anemia, unspecified: Secondary | ICD-10-CM | POA: Diagnosis not present

## 2018-09-13 DIAGNOSIS — Z96641 Presence of right artificial hip joint: Secondary | ICD-10-CM | POA: Diagnosis not present

## 2018-09-13 DIAGNOSIS — M109 Gout, unspecified: Secondary | ICD-10-CM | POA: Diagnosis not present

## 2018-09-13 DIAGNOSIS — Z9181 History of falling: Secondary | ICD-10-CM | POA: Diagnosis not present

## 2018-09-13 DIAGNOSIS — S79111D Salter-Harris Type I physeal fracture of lower end of right femur, subsequent encounter for fracture with routine healing: Secondary | ICD-10-CM | POA: Diagnosis not present

## 2018-09-19 DIAGNOSIS — Z96641 Presence of right artificial hip joint: Secondary | ICD-10-CM | POA: Diagnosis not present

## 2018-09-19 DIAGNOSIS — D649 Anemia, unspecified: Secondary | ICD-10-CM | POA: Diagnosis not present

## 2018-09-19 DIAGNOSIS — M109 Gout, unspecified: Secondary | ICD-10-CM | POA: Diagnosis not present

## 2018-09-19 DIAGNOSIS — Z9181 History of falling: Secondary | ICD-10-CM | POA: Diagnosis not present

## 2018-09-19 DIAGNOSIS — S79111D Salter-Harris Type I physeal fracture of lower end of right femur, subsequent encounter for fracture with routine healing: Secondary | ICD-10-CM | POA: Diagnosis not present

## 2018-09-26 DIAGNOSIS — D649 Anemia, unspecified: Secondary | ICD-10-CM | POA: Diagnosis not present

## 2018-09-26 DIAGNOSIS — Z96641 Presence of right artificial hip joint: Secondary | ICD-10-CM | POA: Diagnosis not present

## 2018-09-26 DIAGNOSIS — M109 Gout, unspecified: Secondary | ICD-10-CM | POA: Diagnosis not present

## 2018-09-26 DIAGNOSIS — Z9181 History of falling: Secondary | ICD-10-CM | POA: Diagnosis not present

## 2018-09-26 DIAGNOSIS — S79111D Salter-Harris Type I physeal fracture of lower end of right femur, subsequent encounter for fracture with routine healing: Secondary | ICD-10-CM | POA: Diagnosis not present

## 2018-09-27 DIAGNOSIS — D649 Anemia, unspecified: Secondary | ICD-10-CM | POA: Diagnosis not present

## 2018-09-27 DIAGNOSIS — Z9181 History of falling: Secondary | ICD-10-CM | POA: Diagnosis not present

## 2018-09-27 DIAGNOSIS — Z96641 Presence of right artificial hip joint: Secondary | ICD-10-CM | POA: Diagnosis not present

## 2018-09-27 DIAGNOSIS — M109 Gout, unspecified: Secondary | ICD-10-CM | POA: Diagnosis not present

## 2018-09-27 DIAGNOSIS — S79111D Salter-Harris Type I physeal fracture of lower end of right femur, subsequent encounter for fracture with routine healing: Secondary | ICD-10-CM | POA: Diagnosis not present

## 2018-10-10 DIAGNOSIS — D649 Anemia, unspecified: Secondary | ICD-10-CM | POA: Diagnosis not present

## 2018-10-10 DIAGNOSIS — S79111D Salter-Harris Type I physeal fracture of lower end of right femur, subsequent encounter for fracture with routine healing: Secondary | ICD-10-CM | POA: Diagnosis not present

## 2018-10-10 DIAGNOSIS — M109 Gout, unspecified: Secondary | ICD-10-CM | POA: Diagnosis not present

## 2018-10-10 DIAGNOSIS — Z96641 Presence of right artificial hip joint: Secondary | ICD-10-CM | POA: Diagnosis not present

## 2018-10-10 DIAGNOSIS — Z9181 History of falling: Secondary | ICD-10-CM | POA: Diagnosis not present

## 2018-10-17 DIAGNOSIS — Z9181 History of falling: Secondary | ICD-10-CM | POA: Diagnosis not present

## 2018-10-17 DIAGNOSIS — D649 Anemia, unspecified: Secondary | ICD-10-CM | POA: Diagnosis not present

## 2018-10-17 DIAGNOSIS — S79111D Salter-Harris Type I physeal fracture of lower end of right femur, subsequent encounter for fracture with routine healing: Secondary | ICD-10-CM | POA: Diagnosis not present

## 2018-10-17 DIAGNOSIS — Z96641 Presence of right artificial hip joint: Secondary | ICD-10-CM | POA: Diagnosis not present

## 2018-10-17 DIAGNOSIS — M109 Gout, unspecified: Secondary | ICD-10-CM | POA: Diagnosis not present

## 2018-10-24 DIAGNOSIS — Z9181 History of falling: Secondary | ICD-10-CM | POA: Diagnosis not present

## 2018-10-24 DIAGNOSIS — D649 Anemia, unspecified: Secondary | ICD-10-CM | POA: Diagnosis not present

## 2018-10-24 DIAGNOSIS — Z96641 Presence of right artificial hip joint: Secondary | ICD-10-CM | POA: Diagnosis not present

## 2018-10-24 DIAGNOSIS — S79111D Salter-Harris Type I physeal fracture of lower end of right femur, subsequent encounter for fracture with routine healing: Secondary | ICD-10-CM | POA: Diagnosis not present

## 2018-10-24 DIAGNOSIS — M109 Gout, unspecified: Secondary | ICD-10-CM | POA: Diagnosis not present

## 2018-12-13 DIAGNOSIS — H35362 Drusen (degenerative) of macula, left eye: Secondary | ICD-10-CM | POA: Diagnosis not present

## 2018-12-19 DIAGNOSIS — M859 Disorder of bone density and structure, unspecified: Secondary | ICD-10-CM | POA: Diagnosis not present

## 2018-12-19 DIAGNOSIS — E7849 Other hyperlipidemia: Secondary | ICD-10-CM | POA: Diagnosis not present

## 2018-12-19 DIAGNOSIS — Z125 Encounter for screening for malignant neoplasm of prostate: Secondary | ICD-10-CM | POA: Diagnosis not present

## 2018-12-20 DIAGNOSIS — Z85820 Personal history of malignant melanoma of skin: Secondary | ICD-10-CM | POA: Diagnosis not present

## 2018-12-20 DIAGNOSIS — L570 Actinic keratosis: Secondary | ICD-10-CM | POA: Diagnosis not present

## 2018-12-20 DIAGNOSIS — Z85828 Personal history of other malignant neoplasm of skin: Secondary | ICD-10-CM | POA: Diagnosis not present

## 2018-12-20 DIAGNOSIS — D485 Neoplasm of uncertain behavior of skin: Secondary | ICD-10-CM | POA: Diagnosis not present

## 2018-12-20 DIAGNOSIS — D225 Melanocytic nevi of trunk: Secondary | ICD-10-CM | POA: Diagnosis not present

## 2018-12-20 DIAGNOSIS — L821 Other seborrheic keratosis: Secondary | ICD-10-CM | POA: Diagnosis not present

## 2018-12-20 DIAGNOSIS — D1801 Hemangioma of skin and subcutaneous tissue: Secondary | ICD-10-CM | POA: Diagnosis not present

## 2018-12-21 DIAGNOSIS — R82998 Other abnormal findings in urine: Secondary | ICD-10-CM | POA: Diagnosis not present

## 2018-12-21 DIAGNOSIS — Z1212 Encounter for screening for malignant neoplasm of rectum: Secondary | ICD-10-CM | POA: Diagnosis not present

## 2018-12-21 DIAGNOSIS — I10 Essential (primary) hypertension: Secondary | ICD-10-CM | POA: Diagnosis not present

## 2018-12-26 DIAGNOSIS — Z23 Encounter for immunization: Secondary | ICD-10-CM | POA: Diagnosis not present

## 2018-12-26 DIAGNOSIS — Z1331 Encounter for screening for depression: Secondary | ICD-10-CM | POA: Diagnosis not present

## 2018-12-26 DIAGNOSIS — R05 Cough: Secondary | ICD-10-CM | POA: Diagnosis not present

## 2018-12-26 DIAGNOSIS — J309 Allergic rhinitis, unspecified: Secondary | ICD-10-CM | POA: Diagnosis not present

## 2018-12-26 DIAGNOSIS — D472 Monoclonal gammopathy: Secondary | ICD-10-CM | POA: Diagnosis not present

## 2018-12-26 DIAGNOSIS — H6122 Impacted cerumen, left ear: Secondary | ICD-10-CM | POA: Diagnosis not present

## 2018-12-26 DIAGNOSIS — R946 Abnormal results of thyroid function studies: Secondary | ICD-10-CM | POA: Diagnosis not present

## 2018-12-26 DIAGNOSIS — N183 Chronic kidney disease, stage 3 unspecified: Secondary | ICD-10-CM | POA: Diagnosis not present

## 2018-12-26 DIAGNOSIS — J45909 Unspecified asthma, uncomplicated: Secondary | ICD-10-CM | POA: Diagnosis not present

## 2018-12-26 DIAGNOSIS — Z0000 Encounter for general adult medical examination without abnormal findings: Secondary | ICD-10-CM | POA: Diagnosis not present

## 2018-12-26 DIAGNOSIS — M25569 Pain in unspecified knee: Secondary | ICD-10-CM | POA: Diagnosis not present

## 2018-12-26 DIAGNOSIS — L989 Disorder of the skin and subcutaneous tissue, unspecified: Secondary | ICD-10-CM | POA: Diagnosis not present

## 2018-12-26 DIAGNOSIS — K5900 Constipation, unspecified: Secondary | ICD-10-CM | POA: Diagnosis not present

## 2018-12-26 DIAGNOSIS — N401 Enlarged prostate with lower urinary tract symptoms: Secondary | ICD-10-CM | POA: Diagnosis not present

## 2019-01-01 DIAGNOSIS — K921 Melena: Secondary | ICD-10-CM | POA: Diagnosis not present

## 2019-01-03 ENCOUNTER — Encounter: Payer: Self-pay | Admitting: Internal Medicine

## 2019-01-17 DIAGNOSIS — M8589 Other specified disorders of bone density and structure, multiple sites: Secondary | ICD-10-CM | POA: Diagnosis not present

## 2019-02-08 ENCOUNTER — Encounter: Payer: Self-pay | Admitting: Internal Medicine

## 2019-02-08 ENCOUNTER — Other Ambulatory Visit: Payer: Self-pay

## 2019-02-08 ENCOUNTER — Ambulatory Visit (INDEPENDENT_AMBULATORY_CARE_PROVIDER_SITE_OTHER): Payer: Medicare Other | Admitting: Internal Medicine

## 2019-02-08 VITALS — BP 126/60 | HR 76 | Temp 97.8°F | Ht 65.25 in | Wt 129.4 lb

## 2019-02-08 DIAGNOSIS — K5900 Constipation, unspecified: Secondary | ICD-10-CM

## 2019-02-08 DIAGNOSIS — K219 Gastro-esophageal reflux disease without esophagitis: Secondary | ICD-10-CM

## 2019-02-08 DIAGNOSIS — R195 Other fecal abnormalities: Secondary | ICD-10-CM | POA: Diagnosis not present

## 2019-02-08 DIAGNOSIS — Z1159 Encounter for screening for other viral diseases: Secondary | ICD-10-CM

## 2019-02-08 NOTE — Progress Notes (Signed)
HISTORY OF PRESENT ILLNESS:  Donald Croom. is a 85 y.o. male, in good health, who is sent by his primary care provider regarding Hemoccult positive stool on routine annual evaluation.  Patient has not been seen in this office since November 2005 when he underwent complete colonoscopy to evaluate Hemoccult-positive stool on outpatient testing.  He was found to have a diminutive colon polyp which was not retrieved as well as mild sigmoid diverticulosis.  Otherwise normal exam.  No routine follow-up recommended.  Recently underwent Hemoccult testing which I have reviewed.  He was initially found to have a positive result December 21, 2018.  Thereafter repeat Hemoccult studies January 01, 2019 were positive x3.  Referral made.  He does have chronic constipation.  He takes MiraLAX periodically which helps.  He is concerned about his large bulky stools which clog his toilet.  No gross bleeding.  No melena.  He does have anal discomfort with large stools.  He also has a history of GERD for which he takes Tums.  Review of outside laboratories from August 21, 2018 shows hemoglobin 11.2.  MCV 101.7.  REVIEW OF SYSTEMS:  All non-GI ROS negative unless otherwise stated in the HPI except for allergies, arthritis, hearing problems  Past Medical History:  Diagnosis Date  . Anemia   . Arthritis   . Cataract   . Complication following bilateral mastoidectomy   . Dysphagia    with large pills  . Gout   . HLD (hyperlipidemia)   . HTN (hypertension)   . Wrist fracture    left    Past Surgical History:  Procedure Laterality Date  . cataracts    . MASTOIDECTOMY REVISION Bilateral years ago  . ORIF WRIST FRACTURE Left 1975  . QUADRICEPS TENDON REPAIR Right 08/23/2018   Procedure: REPAIR QUADRICEP TENDON;  Surgeon: Gaynelle Arabian, MD;  Location: [facility]S;  Service: Orthopedics;  Laterality: Right;  83min  . TOTAL HIP ARTHROPLASTY Left 05/05/2016   Procedure: LEFT TOTAL HIP ARTHROPLASTY ANTERIOR APPROACH;   Surgeon: Gaynelle Arabian, MD;  Location: [facility] ORS;  Service: Orthopedics;  Laterality: Left;    Social History [name].  reports that he has never smoked. He has never used smokeless tobacco. He reports that he does not drink alcohol or use drugs.  family history includes Breast cancer in his sister; Heart attack in his father; Heart attack (age of onset: 35) in his nephew; Heart failure in his sister; Kidney Stones in his daughter; Pneumonia in his mother.  Allergies  Allergen Reactions  . Other Other (See Comments)    Pt can not take gel caps - med gets stuck in throat        PHYSICAL EXAMINATION: Vital signs: BP 126/60 (BP Location: Left Arm, Patient Position: Sitting, Cuff Size: Normal)   Pulse 76   Temp 97.8 F (36.6 C)   Ht 5' 5.25" (1.657 m) Comment: height measured without shoes  Wt 129 lb 6 oz (58.7 kg)   BMI 21.36 kg/m   Constitutional: generally well-appearing, no acute distress Psychiatric: alert and oriented x3, cooperative Eyes: extraocular movements intact, anicteric, conjunctiva pink Mouth: oral pharynx moist, no lesions Neck: supple no lymphadenopathy Cardiovascular: heart regular rate and rhythm, no murmur Lungs: clear to auscultation bilaterally Abdomen: soft, nontender, nondistended, no obvious ascites, no peritoneal signs, normal bowel sounds, no organomegaly Rectal: Deferred until colonoscopy Extremities: no clubbing, cyanosis, or lower extremity edema bilaterally Skin: no lesions on visible extremities Neuro: No focal deficits.  Hearing  aids in place  ASSESSMENT:  1.  Hemoccult positive stool and a healthy 85 year old.  Repeatedly positive.  Etiology unclear.  Colonoscopy 2005 to evaluate the same revealed mild diverticulosis and a diminutive polyp 2.  Chronic constipation.  Ongoing 3.  Reflux symptoms managed with OTC antacids.  No alarm features   PLAN:  1.  We discussed the implications of Hemoccult positive stool.  We discussed work-up or  evaluation strategies with his age and consideration. 2.  Patient wishes to proceed with colonoscopy to rule out significant pathology.  He is high risk given his age.The nature of the procedure, as well as the risks, benefits, and alternatives were carefully and thoroughly reviewed with the patient. Ample time for discussion and questions allowed. The patient understood, was satisfied, and agreed to proceed. 3.  I discussed with him using MiraLAX more regularly to have more regular bowel movements and avoid large bulky movements which cause pain and clogging his toilet. 4.  Reflux precautions and OTC therapies on demand to control reflux symptoms

## 2019-02-08 NOTE — Patient Instructions (Signed)
You have been scheduled for a colonoscopy. Please follow written instructions given to you at your visit today.  Please pick up your prep supplies at the pharmacy within the next 1-3 days. If you use inhalers (even only as needed), please bring them with you on the day of your procedure.   

## 2019-02-12 ENCOUNTER — Ambulatory Visit (INDEPENDENT_AMBULATORY_CARE_PROVIDER_SITE_OTHER): Payer: Medicare Other

## 2019-02-12 DIAGNOSIS — Z1159 Encounter for screening for other viral diseases: Secondary | ICD-10-CM

## 2019-02-12 LAB — SARS CORONAVIRUS 2 (TAT 6-24 HRS): SARS Coronavirus 2: NEGATIVE

## 2019-02-14 ENCOUNTER — Ambulatory Visit (AMBULATORY_SURGERY_CENTER): Payer: Medicare Other | Admitting: Internal Medicine

## 2019-02-14 ENCOUNTER — Other Ambulatory Visit: Payer: Self-pay

## 2019-02-14 ENCOUNTER — Encounter: Payer: Self-pay | Admitting: Internal Medicine

## 2019-02-14 VITALS — BP 150/71 | HR 74 | Resp 17 | Ht 65.25 in | Wt 129.0 lb

## 2019-02-14 DIAGNOSIS — K5790 Diverticulosis of intestine, part unspecified, without perforation or abscess without bleeding: Secondary | ICD-10-CM | POA: Diagnosis not present

## 2019-02-14 DIAGNOSIS — K5900 Constipation, unspecified: Secondary | ICD-10-CM | POA: Diagnosis not present

## 2019-02-14 DIAGNOSIS — K633 Ulcer of intestine: Secondary | ICD-10-CM

## 2019-02-14 DIAGNOSIS — R195 Other fecal abnormalities: Secondary | ICD-10-CM | POA: Diagnosis not present

## 2019-02-14 MED ORDER — SODIUM CHLORIDE 0.9 % IV SOLN: 500.0000 mL | Freq: Once | INTRAVENOUS | Status: DC

## 2019-02-14 NOTE — Progress Notes (Signed)
Called to room to assist during endoscopic procedure.  Patient ID and intended procedure confirmed with present staff. Received instructions for my participation in the procedure from the performing physician.  

## 2019-02-14 NOTE — Progress Notes (Signed)
A/ox3, pleased with MAC, report to RN 

## 2019-02-14 NOTE — Op Note (Signed)
[place] Patient Name: [name] Procedure Date: 02/14/2019 11:37 AM MRN: [id number] Endoscopist: Docia Chuck. Henrene Pastor , MD Age: 85 Referring MD:  Date of Birth: [birth year] Gender: Male Account #: [id number] Procedure:                Colonoscopy with biopsies Indications:              Heme positive stool. Previous examination 2005 was                            negative for neoplasia Medicines:                Monitored Anesthesia Care Procedure:                Pre-Anesthesia Assessment:                           - Prior to the procedure, a History and Physical                            was performed, and patient medications and                            allergies were reviewed. The patient's tolerance of                            previous anesthesia was also reviewed. The risks                            and benefits of the procedure and the sedation                            options and risks were discussed with the patient.                            All questions were answered, and informed consent                            was obtained. Prior Anticoagulants: The patient has                            taken no previous anticoagulant or antiplatelet                            agents. ASA Grade Assessment: II - A patient with                            mild systemic disease. After reviewing the risks                            and benefits, the patient was deemed in                            satisfactory condition to undergo the procedure.  After obtaining informed consent, the colonoscope                            was passed under direct vision. Throughout the                            procedure, the patient's blood pressure, pulse, and                            oxygen saturations were monitored continuously. The                            Colonoscope was introduced through the anus and                            advanced to the the cecum,  identified by                            appendiceal orifice and ileocecal valve. The                            ileocecal valve, appendiceal orifice, and rectum                            were photographed. The quality of the bowel                            preparation was good. The colonoscopy was performed                            without difficulty. The patient tolerated the                            procedure well. The bowel preparation used was                            SUPREP via split dose instruction. Scope In: 11:49:08 AM Scope Out: 68:12:75 PM Scope Withdrawal Time: 0 hours 13 minutes 5 seconds  Total Procedure Duration: 0 hours 17 minutes 10 seconds  Findings:                 A single (solitary) five mm ulcer was found in the                            cecum. Biopsies were taken with a cold forceps for                            histology.                           Multiple diverticula were found in the sigmoid                            colon.  The exam was otherwise without abnormality on                            direct and retroflexion views. Complications:            No immediate complications. Estimated blood loss:                            None. Estimated Blood Loss:     Estimated blood loss: none. Impression:               - A single (solitary) ulcer in the cecum. Biopsied.                            This likely explains Hemoccult positive stool                           - Diverticulosis in the sigmoid colon.                           - The examination was otherwise normal on direct                            and retroflexion views. Recommendation:           - Repeat colonoscopy is not recommended for                            surveillance.                           - Patient has a contact number available for                            emergencies. The signs and symptoms of potential                            delayed complications  were discussed with the                            patient. Return to normal activities tomorrow.                            Written discharge instructions were provided to the                            patient.                           - Resume previous diet.                           - Continue present medications.                           - Await pathology results. Docia Chuck. Henrene Pastor, MD 02/14/2019 12:11:43 PM This report has been signed electronically.

## 2019-02-14 NOTE — Patient Instructions (Signed)
YOU HAD AN ENDOSCOPIC PROCEDURE TODAY AT THE [facility]:   Refer to the procedure report that was given to you for any specific questions about what was found during the examination.  If the procedure report does not answer your questions, please call your gastroenterologist to clarify.  If you requested that your care partner not be given the details of your procedure findings, then the procedure report has been included in a sealed envelope for you to review at your convenience later.  YOU SHOULD EXPECT: Some feelings of bloating in the abdomen. Passage of more gas than usual.  Walking can help get rid of the air that was put into your GI tract during the procedure and reduce the bloating. If you had a lower endoscopy (such as a colonoscopy or flexible sigmoidoscopy) you may notice spotting of blood in your stool or on the toilet paper. If you underwent a bowel prep for your procedure, you may not have a normal bowel movement for a few days.  Please Note:  You might notice some irritation and congestion in your nose or some drainage.  This is from the oxygen used during your procedure.  There is no need for concern and it should clear up in a day or so.  SYMPTOMS TO REPORT IMMEDIATELY:   Following lower endoscopy (colonoscopy or flexible sigmoidoscopy):  Excessive amounts of blood in the stool  Significant tenderness or worsening of abdominal pains  Swelling of the abdomen that is new, acute  Fever of 100F or higher   For urgent or emergent issues, a gastroenterologist can be reached at any hour by calling [phone].   DIET:  We do recommend a small meal at first, but then you may proceed to your regular diet.  Drink plenty of fluids but you should avoid alcoholic beverages for 24 hours.  MEDICATIONS: Continue present medications.  Please see handouts given to you by your recovery nurse.  ACTIVITY:  You should plan to take it easy for the rest of today and you should  NOT DRIVE or use heavy machinery until tomorrow (because of the sedation medicines used during the test).    FOLLOW UP: Our staff will call the number listed on your records 48-72 hours following your procedure to check on you and address any questions or concerns that you may have regarding the information given to you following your procedure. If we do not reach you, we will leave a message.  We will attempt to reach you two times.  During this call, we will ask if you have developed any symptoms of COVID 19. If you develop any symptoms (ie: fever, flu-like symptoms, shortness of breath, cough etc.) before then, please call [phone].  If you test positive for Covid 19 in the 2 weeks post procedure, please call and report this information to us.    If any biopsies were taken you will be contacted by phone or by letter within the next 1-3 weeks.  Please call us at [phone] if you have not heard about the biopsies in 3 weeks.   Thank you for allowing us to provide for your healthcare needs today.   SIGNATURES/CONFIDENTIALITY: You and/or your care partner have signed paperwork which will be entered into your electronic medical record.  These signatures attest to the fact that that the information above on your After Visit Summary has been reviewed and is understood.  Full responsibility of the confidentiality of this discharge information lies with you and/or   your care-partner. 

## 2019-02-14 NOTE — Progress Notes (Signed)
Temp taken by JR VS taken by DT 

## 2019-02-16 ENCOUNTER — Telehealth: Payer: Self-pay | Admitting: Internal Medicine

## 2019-02-16 ENCOUNTER — Telehealth: Payer: Self-pay

## 2019-02-16 NOTE — Telephone Encounter (Signed)
Spoke with pt and let him know he can take miralax, he has not had a BM since 12/16.

## 2019-02-16 NOTE — Telephone Encounter (Signed)
  Follow up Call-  Call back number 02/14/2019  Post procedure Call Back phone  # [phone]  Permission to leave phone message Yes  Some recent data might be hidden     Patient questions:  Do you have a fever, pain , or abdominal swelling? No. Pain Score  0 *  Have you tolerated food without any problems? Yes.    Have you been able to return to your normal activities? Yes.    Do you have any questions about your discharge instructions: Diet   No. Medications  No. Follow up visit  No.  Do you have questions or concerns about your Care? No.  Actions: * If pain score is 4 or above: No action needed, pain <4. 1. Have you developed a fever since your procedure? no  2.   Have you had an respiratory symptoms (SOB or cough) since your procedure? no  3.   Have you tested positive for COVID 19 since your procedure no  4.   Have you had any family members/close contacts diagnosed with the COVID 19 since your procedure?  no   If yes to any of these questions please route to Joylene John, RN and Alphonsa Gin, Therapist, sports.

## 2019-02-16 NOTE — Telephone Encounter (Signed)
Pt had a colon 02/14/19 and would like to know whether he can take Miralax.

## 2019-02-19 ENCOUNTER — Encounter: Payer: Self-pay | Admitting: Internal Medicine

## 2019-03-14 DIAGNOSIS — C44722 Squamous cell carcinoma of skin of right lower limb, including hip: Secondary | ICD-10-CM | POA: Diagnosis not present

## 2019-03-14 DIAGNOSIS — Z85828 Personal history of other malignant neoplasm of skin: Secondary | ICD-10-CM | POA: Diagnosis not present

## 2019-03-27 ENCOUNTER — Ambulatory Visit: Payer: Medicare Other

## 2019-04-05 ENCOUNTER — Telehealth: Payer: Self-pay

## 2019-04-05 NOTE — Telephone Encounter (Signed)
Pt returned call from nurse regarding his rescheduled vaccination appt. He has an appt scheduled with [facility] and will not need and appt with [facility].   [place]

## 2019-05-31 DIAGNOSIS — H90A21 Sensorineural hearing loss, unilateral, right ear, with restricted hearing on the contralateral side: Secondary | ICD-10-CM | POA: Diagnosis not present

## 2019-05-31 DIAGNOSIS — Z974 Presence of external hearing-aid: Secondary | ICD-10-CM | POA: Diagnosis not present

## 2019-05-31 DIAGNOSIS — J343 Hypertrophy of nasal turbinates: Secondary | ICD-10-CM | POA: Diagnosis not present

## 2019-05-31 DIAGNOSIS — H9192 Unspecified hearing loss, left ear: Secondary | ICD-10-CM | POA: Diagnosis not present

## 2019-05-31 DIAGNOSIS — H938X1 Other specified disorders of right ear: Secondary | ICD-10-CM | POA: Diagnosis not present

## 2019-06-21 ENCOUNTER — Other Ambulatory Visit: Payer: Self-pay

## 2019-06-21 ENCOUNTER — Encounter (INDEPENDENT_AMBULATORY_CARE_PROVIDER_SITE_OTHER): Payer: Self-pay | Admitting: Ophthalmology

## 2019-06-21 ENCOUNTER — Ambulatory Visit (INDEPENDENT_AMBULATORY_CARE_PROVIDER_SITE_OTHER): Payer: Medicare Other | Admitting: Ophthalmology

## 2019-06-21 DIAGNOSIS — H26491 Other secondary cataract, right eye: Secondary | ICD-10-CM

## 2019-06-21 DIAGNOSIS — H35371 Puckering of macula, right eye: Secondary | ICD-10-CM | POA: Diagnosis not present

## 2019-06-21 DIAGNOSIS — H353112 Nonexudative age-related macular degeneration, right eye, intermediate dry stage: Secondary | ICD-10-CM | POA: Insufficient documentation

## 2019-06-21 DIAGNOSIS — H353122 Nonexudative age-related macular degeneration, left eye, intermediate dry stage: Secondary | ICD-10-CM

## 2019-06-21 HISTORY — DX: Other secondary cataract, right eye: H26.491

## 2019-06-21 NOTE — Progress Notes (Signed)
06/21/2019     CHIEF COMPLAINT Patient presents for Retina Follow Up   HISTORY OF PRESENT ILLNESS: [name]. is a 86 y.o. male who presents to the clinic today for:   HPI    Retina Follow Up    Patient presents with  Dry AMD.  In both eyes.  This started 2 years ago.  Severity is mild.  Duration of 2 years.  Since onset it is stable.          Comments    2 Year AMD F/U approx OU  Pt reports overall stable VA OU x 2 years approx. No ocular pain, flashes of light, or floaters OU. Pt c/o intermittent watering OS.       Last edited by Rockie Neighbours, [place] on 06/21/2019  2:04 PM. (History)      Referring physician: Crist Infante, MD [place],  [place] [address]  HISTORICAL INFORMATION:   Selected notes from the [facility]: No current outpatient medications on file. (Ophthalmic Drugs)   No current facility-administered medications for this visit. (Ophthalmic Drugs)   Current Outpatient Medications (Other)  Medication Sig  . amLODipine (NORVASC) 2.5 MG tablet Take 2.5 mg by mouth daily.  . Amlodipine Besylate POWD amlodipine besylate (bulk)  . aspirin EC 81 MG tablet Take 81 mg by mouth daily.  . budesonide-formoterol (SYMBICORT) 160-4.5 MCG/ACT inhaler Symbicort  . calcium carbonate (TUMS - DOSED IN MG ELEMENTAL CALCIUM) 500 MG chewable tablet Chew 1-2 tablets by mouth as needed for indigestion or heartburn.  . Calcium-Vitamin D-Vitamin K (VIACTIV CALCIUM PLUS D) 650-12.5-40 MG-MCG-MCG CHEW Chew 1 each by mouth daily.  . Camphor-Eucalyptus-Menthol (CHEST RUB) 4.8-1.2-2.6 % OINT Vicks Vaporub  . chlorthalidone (HYGROTON) 25 MG tablet Take 12.5 mg by mouth daily.  . Cholecalciferol (VITAMIN D) 50 MCG (2000 UT) tablet Take 2,000 Units by mouth daily.  . Cyanocobalamin (B-12) 2500 MCG TABS Take 2,500 mcg by mouth daily.  . finasteride (PROPECIA) 1 MG tablet finasteride  . finasteride (PROSCAR) 5 MG tablet Take 5 mg by  mouth daily.  . Multiple Minerals-Vitamins (BONE ESSENTIALS) CAPS calcium  . NON FORMULARY Take 1 tablet by mouth daily. Vision Vite - 1 tablet daily    . Omega-3 Fatty Acids (RA FISH OIL) 1000 MG CAPS Fish Oil  . polyethylene glycol (MIRALAX / GLYCOLAX) packet Take 17 g by mouth daily as needed for mild constipation.   . rosuvastatin (CRESTOR) 5 MG tablet Take 5 mg by mouth daily.  . Wheat Dextrin (BENEFIBER) POWD Take 10 mLs by mouth daily. 2 tsp Daily   Current Facility-Administered Medications (Other)  Medication Route  . 0.9 %  sodium chloride infusion Intravenous      REVIEW OF SYSTEMS:    ALLERGIES Allergies  Allergen Reactions  . Other Other (See Comments)    Pt can not take gel caps - med gets stuck in throat     PAST MEDICAL HISTORY Past Medical History:  Diagnosis Date  . Anemia   . Arthritis   . Cataract   . Complication following bilateral mastoidectomy   . Dysphagia    with large pills  . Gout   . HLD (hyperlipidemia)   . HTN (hypertension)   . Wrist fracture    left   Past Surgical History:  Procedure Laterality Date  . cataracts    . MASTOIDECTOMY REVISION Bilateral years ago  . ORIF WRIST FRACTURE Left 1975  .  QUADRICEPS TENDON REPAIR Right 08/23/2018   Procedure: REPAIR QUADRICEP TENDON;  Surgeon: Gaynelle Arabian, MD;  Location: [facility];  Service: Orthopedics;  Laterality: Right;  62min  . TOTAL HIP ARTHROPLASTY Left 05/05/2016   Procedure: LEFT TOTAL HIP ARTHROPLASTY ANTERIOR APPROACH;  Surgeon: Gaynelle Arabian, MD;  Location: [facility];  Service: Orthopedics;  Laterality: Left;    FAMILY HISTORY Family History  Problem Relation Age of Onset  . Pneumonia Mother   . Heart attack Father   . Breast cancer Sister   . Heart attack Nephew 66  . Heart failure Sister   . Kidney Stones Daughter   . Esophageal cancer Neg Hx   . Colon cancer Neg Hx   . Rectal cancer Neg Hx   . Stomach cancer Neg Hx     SOCIAL HISTORY Social History   Tobacco Use    . Smoking status: Never Smoker  . Smokeless tobacco: Never Used  Substance Use Topics  . Alcohol use: No  . Drug use: No         OPHTHALMIC EXAM:  Base Eye Exam    Visual Acuity (Snellen - Linear)      Right Left   Dist cc 20/70 +2 E card @ 5'   Dist ph cc NI NI   Correction: Glasses       Tonometry (Tonopen, 2:10 PM)      Right Left   Pressure 11 12       Pupils      Pupils Dark Light Shape React APD   Right PERRL 4 3 Round Brisk None   Left PERRL 4 3 Round Brisk None       Visual Fields (Counting fingers)      Left Right    Full Full       Extraocular Movement      Right Left    Full Full       Neuro/Psych    Oriented x3: Yes   Mood/Affect: Normal       Dilation    Both eyes: 1.0% Mydriacyl, 2.5% Phenylephrine @ 2:10 PM        Slit Lamp and Fundus Exam    External Exam      Right Left   External Normal Normal       Slit Lamp Exam      Right Left   Lids/Lashes Normal Normal   Conjunctiva/Sclera White and quiet White and quiet   Cornea Clear Clear   Anterior Chamber Deep and quiet Deep and quiet   Iris Round and reactive Round and reactive   Lens Posterior chamber intraocular lens, 2+ Posterior capsular opacification Posterior chamber intraocular lens, Open posterior capsule   Anterior Vitreous Normal Normal       Fundus Exam      Right Left   Posterior Vitreous Normal Normal   Disc Normal 1+ Pallor   C/D Ratio 0.45 0.7   Macula Epiretinal membrane Normal   Vessels Normal Normal   Periphery Normal Normal          IMAGING AND PROCEDURES  Imaging and Procedures for @TODAY @  OCT, Retina - OU - Both Eyes       Right Eye Central Foveal Thickness: 346. Progression has been stable. Findings include epiretinal membrane, abnormal foveal contour.   Left Eye Central Foveal Thickness: 374. Findings include abnormal foveal contour, retinal drusen .   Notes OD with epiretinal membrane, overall stable since his last visit there is some  medial opacity  ASSESSMENT/PLAN:  Right posterior capsular opacification PC opacification is present and accounts for visual symptoms. A YAG PC is recommended.  This procedure will maximize visual potential and allow enhanced medical monitoring of retinal conditions.  The risk and benefits were explained to the patient, including the risk of an increase in intraocular pressure temporarily after the procedure.   OD, I recommend YAG capsulotomy to improve and the chance of improving vision without resorting to major surgery on the retina.  Visual acuity improved substantially, will observe the epiretinal membrane OD.   more over this would clear the media so that better evaluation of the macula could be undertaken.  Right epiretinal membrane The nature of macular pucker (epiretinal membrane ERM) was discussed with the patient as well as threshold criteria for vitrectomy surgery. I explained that in rare cases another surgery is needed to actually remove a second wrinkle should it regrow.  Most often, the epiretinal membrane and underlying wrinkled internal limiting membrane are removed with the first surgery, to accomplish the goals.   If the operative eye is [place] (natural lens still present), cataract surgery is often recommended prior to Vitrectomy. This will enable the retina surgeon to have the best view during surgery and the patient to obtain optimal results in the future. Treatment options were discussed.  OD, stable      ICD-10-CM   1. Right posterior capsular opacification  H26.491 Yag Capsulotomy - OD - Right Eye  2. Right epiretinal membrane  H35.371 OCT, Retina - OU - Both Eyes  3. Intermediate stage nonexudative age-related macular degeneration of left eye  H35.3122 OCT, Retina - OU - Both Eyes  4. Intermediate stage nonexudative age-related macular degeneration of right eye  H35.3112     1.  YAG capsulotomy offered OD  2.  3.  Ophthalmic Meds Ordered this  visit:  No orders of the defined types were placed in this encounter.      No follow-ups on file.  There are no Patient Instructions on file for this visit.   Explained the diagnoses, plan, and follow up with the patient and they expressed understanding.  Patient expressed understanding of the importance of proper follow up care.   Clent Demark Jaislyn Blinn M.D. Diseases & Surgery of the Retina and Vitreous Retina & Diabetic [place] @TODAY @     Abbreviations: M myopia (nearsighted); A astigmatism; H hyperopia (farsighted); P presbyopia; Mrx spectacle prescription;  CTL contact lenses; OD right eye; OS left eye; OU both eyes  XT exotropia; ET esotropia; PEK punctate epithelial keratitis; PEE punctate epithelial erosions; DES dry eye syndrome; MGD meibomian gland dysfunction; ATs artificial tears; PFAT's preservative free artificial tears; [place] nuclear sclerotic cataract; PSC posterior subcapsular cataract; ERM epi-retinal membrane; PVD posterior vitreous detachment; RD retinal detachment; DM diabetes mellitus; DR diabetic retinopathy; NPDR non-proliferative diabetic retinopathy; PDR proliferative diabetic retinopathy; CSME clinically significant macular edema; DME diabetic macular edema; dbh dot blot hemorrhages; CWS cotton wool spot; POAG primary open angle glaucoma; C/D cup-to-disc ratio; HVF humphrey visual field; GVF goldmann visual field; OCT optical coherence tomography; IOP intraocular pressure; BRVO Branch retinal vein occlusion; CRVO central retinal vein occlusion; CRAO central retinal artery occlusion; BRAO branch retinal artery occlusion; RT retinal tear; SB scleral buckle; PPV pars plana vitrectomy; VH Vitreous hemorrhage; PRP panretinal laser photocoagulation; IVK intravitreal kenalog; VMT vitreomacular traction; MH Macular hole;  NVD neovascularization of the disc; NVE neovascularization elsewhere; AREDS age related eye disease study; ARMD age related macular degeneration; POAG primary open  angle  glaucoma; EBMD epithelial/anterior basement membrane dystrophy; ACIOL anterior chamber intraocular lens; IOL intraocular lens; PCIOL posterior chamber intraocular lens; Phaco/IOL phacoemulsification with intraocular lens placement; [place] photorefractive keratectomy; LASIK laser assisted in situ keratomileusis; HTN hypertension; DM diabetes mellitus; COPD chronic obstructive pulmonary disease

## 2019-06-21 NOTE — Assessment & Plan Note (Signed)
PC opacification is present and accounts for visual symptoms. A YAG PC is recommended.  This procedure will maximize visual potential and allow enhanced medical monitoring of retinal conditions.  The risk and benefits were explained to the patient, including the risk of an increase in intraocular pressure temporarily after the procedure.   OD, I recommend YAG capsulotomy to improve and the chance of improving vision without resorting to major surgery on the retina.  Visual acuity improved substantially, will observe the epiretinal membrane OD.   more over this would clear the media so that better evaluation of the macula could be undertaken.

## 2019-06-21 NOTE — Assessment & Plan Note (Addendum)

## 2019-06-26 DIAGNOSIS — M25562 Pain in left knee: Secondary | ICD-10-CM | POA: Diagnosis not present

## 2019-06-26 DIAGNOSIS — N1831 Chronic kidney disease, stage 3a: Secondary | ICD-10-CM | POA: Diagnosis not present

## 2019-06-26 DIAGNOSIS — M25561 Pain in right knee: Secondary | ICD-10-CM | POA: Diagnosis not present

## 2019-06-26 DIAGNOSIS — R946 Abnormal results of thyroid function studies: Secondary | ICD-10-CM | POA: Diagnosis not present

## 2019-06-26 DIAGNOSIS — N401 Enlarged prostate with lower urinary tract symptoms: Secondary | ICD-10-CM | POA: Diagnosis not present

## 2019-06-26 DIAGNOSIS — J45909 Unspecified asthma, uncomplicated: Secondary | ICD-10-CM | POA: Diagnosis not present

## 2019-06-26 DIAGNOSIS — R195 Other fecal abnormalities: Secondary | ICD-10-CM | POA: Diagnosis not present

## 2019-06-26 DIAGNOSIS — D649 Anemia, unspecified: Secondary | ICD-10-CM | POA: Diagnosis not present

## 2019-06-26 DIAGNOSIS — I10 Essential (primary) hypertension: Secondary | ICD-10-CM | POA: Diagnosis not present

## 2019-06-26 DIAGNOSIS — J309 Allergic rhinitis, unspecified: Secondary | ICD-10-CM | POA: Diagnosis not present

## 2019-07-19 DIAGNOSIS — R351 Nocturia: Secondary | ICD-10-CM | POA: Diagnosis not present

## 2019-07-19 DIAGNOSIS — N401 Enlarged prostate with lower urinary tract symptoms: Secondary | ICD-10-CM | POA: Diagnosis not present

## 2019-09-17 DIAGNOSIS — H9192 Unspecified hearing loss, left ear: Secondary | ICD-10-CM | POA: Diagnosis not present

## 2019-09-17 DIAGNOSIS — H90A21 Sensorineural hearing loss, unilateral, right ear, with restricted hearing on the contralateral side: Secondary | ICD-10-CM | POA: Diagnosis not present

## 2019-09-17 DIAGNOSIS — H7202 Central perforation of tympanic membrane, left ear: Secondary | ICD-10-CM | POA: Diagnosis not present

## 2019-09-17 DIAGNOSIS — H9212 Otorrhea, left ear: Secondary | ICD-10-CM | POA: Diagnosis not present

## 2019-09-20 ENCOUNTER — Other Ambulatory Visit: Payer: Self-pay

## 2019-09-20 ENCOUNTER — Ambulatory Visit (INDEPENDENT_AMBULATORY_CARE_PROVIDER_SITE_OTHER): Payer: Medicare Other | Admitting: Ophthalmology

## 2019-09-20 ENCOUNTER — Encounter (INDEPENDENT_AMBULATORY_CARE_PROVIDER_SITE_OTHER): Payer: Self-pay | Admitting: Ophthalmology

## 2019-09-20 DIAGNOSIS — H353112 Nonexudative age-related macular degeneration, right eye, intermediate dry stage: Secondary | ICD-10-CM | POA: Diagnosis not present

## 2019-09-20 DIAGNOSIS — H26491 Other secondary cataract, right eye: Secondary | ICD-10-CM

## 2019-09-20 DIAGNOSIS — H35371 Puckering of macula, right eye: Secondary | ICD-10-CM

## 2019-09-20 NOTE — Assessment & Plan Note (Signed)

## 2019-09-20 NOTE — Progress Notes (Signed)
09/20/2019     CHIEF COMPLAINT Patient presents for Retina Follow Up   HISTORY OF PRESENT ILLNESS: [name]. is a 86 y.o. male who presents to the clinic today for:   HPI    Retina Follow Up    Patient presents with  Other.  In right eye.  This started 3 months ago.  Severity is mild.  Duration of 3 months.  Since onset it is stable.          Comments    3 Month F/U OU  Pt reports stable VA OU. Pt sts YAG did not help to clear VA OD. Pt denies ocular pain OU.       Last edited by Rockie Neighbours, Coch[place] on 09/20/2019 12:54 PM. (History)      Referring physician: Crist Infante, MD [place],   [id number]  HISTORICAL INFORMATION:   Selected notes from the [place]: No current outpatient medications on file. (Ophthalmic Drugs)   No current facility-administered medications for this visit. (Ophthalmic Drugs)   Current Outpatient Medications (Other)  Medication Sig  . amLODipine (NORVASC) 2.5 MG tablet Take 2.5 mg by mouth daily.  . Amlodipine Besylate POWD amlodipine besylate (bulk)  . aspirin EC 81 MG tablet Take 81 mg by mouth daily.  . budesonide-formoterol (SYMBICORT) 160-4.5 MCG/ACT inhaler Symbicort  . calcium carbonate (TUMS - DOSED IN MG ELEMENTAL CALCIUM) 500 MG chewable tablet Chew 1-2 tablets by mouth as needed for indigestion or heartburn.  . Calcium-Vitamin D-Vitamin K (VIACTIV CALCIUM PLUS D) 650-12.5-40 MG-MCG-MCG CHEW Chew 1 each by mouth daily.  . Camphor-Eucalyptus-Menthol (CHEST RUB) 4.8-1.2-2.6 % OINT Vicks Vaporub  . chlorthalidone (HYGROTON) 25 MG tablet Take 12.5 mg by mouth daily.  . Cholecalciferol (VITAMIN D) 50 MCG (2000 UT) tablet Take 2,000 Units by mouth daily.  . Cyanocobalamin (B-12) 2500 MCG TABS Take 2,500 mcg by mouth daily.  . finasteride (PROPECIA) 1 MG tablet finasteride  . finasteride (PROSCAR) 5 MG tablet Take 5 mg by mouth daily.  . Multiple Minerals-Vitamins (BONE  ESSENTIALS) CAPS calcium  . NON FORMULARY Take 1 tablet by mouth daily. Vision Vite - 1 tablet daily    . Omega-3 Fatty Acids (RA FISH OIL) 1000 MG CAPS Fish Oil  . polyethylene glycol (MIRALAX / GLYCOLAX) packet Take 17 g by mouth daily as needed for mild constipation.   . rosuvastatin (CRESTOR) 5 MG tablet Take 5 mg by mouth daily.  . Wheat Dextrin (BENEFIBER) POWD Take 10 mLs by mouth daily. 2 tsp Daily   Current Facility-Administered Medications (Other)  Medication Route  . 0.9 %  sodium chloride infusion Intravenous      REVIEW OF SYSTEMS:    ALLERGIES Allergies  Allergen Reactions  . Other Other (See Comments)    Pt can not take gel caps - med gets stuck in throat     PAST MEDICAL HISTORY Past Medical History:  Diagnosis Date  . Anemia   . Arthritis   . Cataract   . Complication following bilateral mastoidectomy   . Dysphagia    with large pills  . Gout   . HLD (hyperlipidemia)   . HTN (hypertension)   . Right posterior capsular opacification 06/21/2019  . Wrist fracture    left   Past Surgical History:  Procedure Laterality Date  . cataracts    . MASTOIDECTOMY REVISION Bilateral years ago  . ORIF WRIST FRACTURE Left 1975  .  QUADRICEPS TENDON REPAIR Right 08/23/2018   Procedure: REPAIR QUADRICEP TENDON;  Surgeon: Gaynelle Arabian, MD;  Location: [facility];  Service: Orthopedics;  Laterality: Right;  78min  . TOTAL HIP ARTHROPLASTY Left 05/05/2016   Procedure: LEFT TOTAL HIP ARTHROPLASTY ANTERIOR APPROACH;  Surgeon: Gaynelle Arabian, MD;  Location: [facility];  Service: Orthopedics;  Laterality: Left;    FAMILY HISTORY Family History  Problem Relation Age of Onset  . Pneumonia Mother   . Heart attack Father   . Breast cancer Sister   . Heart attack Nephew 66  . Heart failure Sister   . Kidney Stones Daughter   . Esophageal cancer Neg Hx   . Colon cancer Neg Hx   . Rectal cancer Neg Hx   . Stomach cancer Neg Hx     SOCIAL HISTORY Social History   Tobacco Use   . Smoking status: Never Smoker  . Smokeless tobacco: Never Used  Vaping Use  . Vaping Use: Never used  Substance Use Topics  . Alcohol use: No  . Drug use: No         OPHTHALMIC EXAM:  Base Eye Exam    Visual Acuity (ETDRS)      Right Left   Dist cc 20/60 -1 E card @ 5'   Dist ph cc NI NI   Correction: Glasses       Tonometry (Tonopen, 12:57 PM)      Right Left   Pressure 13 13       Pupils      Pupils Dark Light Shape React APD   Right PERRL 4 3 Round Brisk None   Left PERRL 4 3 Round Brisk None       Visual Fields (Counting fingers)      Left Right    Full Full       Extraocular Movement      Right Left    Full Full       Neuro/Psych    Oriented x3: Yes   Mood/Affect: Normal       Dilation    Both eyes: 1.0% Mydriacyl, 2.5% Phenylephrine @ 12:57 PM        Slit Lamp and Fundus Exam    External Exam      Right Left   External Normal Normal       Slit Lamp Exam      Right Left   Lids/Lashes Normal Normal   Conjunctiva/Sclera White and quiet White and quiet   Cornea Clear Clear   Anterior Chamber Deep and quiet Deep and quiet   Iris Round and reactive Round and reactive   Lens Posterior chamber intraocular lens, 2+ Posterior capsular opacification Posterior chamber intraocular lens, Open posterior capsule   Anterior Vitreous Normal Normal       Fundus Exam      Right Left   Posterior Vitreous Normal Normal   Disc Normal 1+ Pallor   C/D Ratio 0.45 0.7   Macula Epiretinal membrane Normal   Vessels Normal Normal   Periphery Normal Normal          IMAGING AND PROCEDURES  Imaging and Procedures for 09/20/19  OCT, Retina - OU - Both Eyes       Right Eye Central Foveal Thickness: 345. Progression has been stable. Findings include epiretinal membrane.   Left Eye Quality was good. Scan locations included subfoveal. Central Foveal Thickness: 403. Findings include epiretinal membrane, lamellar hole.   Notes Post yag capsulotomy,  OD,,  Clearer media  ASSESSMENT/PLAN:  Right posterior capsular opacification This issue resolved  Right epiretinal membrane OD, no change in macular thickening, current status accounts for acuity OD  Intermediate stage nonexudative age-related macular degeneration of right eye The nature of dry age related macular degeneration was discussed with the patient as well as its possible conversion to wet. The results of the AREDS 2 study was discussed with the patient. A diet rich in dark leafy green vegetables was advised and specific recommendations were made regarding supplements with AREDS 2 formulation . Control of hypertension and serum cholesterol may slow the disease. Smoking cessation is mandatory to slow the disease and diminish the risk of progressing to wet age related macular degeneration. The patient was instructed in the use of an [place] and was told to return immediately for any changes in the Grid. Stressed to the patient do not rub eyes      ICD-10-CM   1. Right epiretinal membrane  H35.371 OCT, Retina - OU - Both Eyes  2. Right posterior capsular opacification  H26.491   3. Intermediate stage nonexudative age-related macular degeneration of right eye  H35.3112     1.  2.  3.  Ophthalmic Meds Ordered this visit:  No orders of the defined types were placed in this encounter.      No follow-ups on file.  There are no Patient Instructions on file for this visit.   Explained the diagnoses, plan, and follow up with the patient and they expressed understanding.  Patient expressed understanding of the importance of proper follow up care.   Clent Demark Kalei Mckillop M.D. Diseases & Surgery of the Retina and Vitreous [facility] [place] 09/20/19     Abbreviations: M myopia (nearsighted); A astigmatism; H hyperopia (farsighted); P presbyopia; Mrx spectacle prescription;  CTL contact lenses; OD right eye; OS left eye; OU both eyes  XT  exotropia; ET esotropia; PEK punctate epithelial keratitis; PEE punctate epithelial erosions; DES dry eye syndrome; MGD meibomian gland dysfunction; ATs artificial tears; PFAT's preservative free artificial tears; [place] nuclear sclerotic cataract; PSC posterior subcapsular cataract; ERM epi-retinal membrane; PVD posterior vitreous detachment; RD retinal detachment; DM diabetes mellitus; DR diabetic retinopathy; NPDR non-proliferative diabetic retinopathy; PDR proliferative diabetic retinopathy; CSME clinically significant macular edema; DME diabetic macular edema; dbh dot blot hemorrhages; CWS cotton wool spot; POAG primary open angle glaucoma; C/D cup-to-disc ratio; HVF humphrey visual field; GVF goldmann visual field; OCT optical coherence tomography; IOP intraocular pressure; BRVO Branch retinal vein occlusion; CRVO central retinal vein occlusion; CRAO central retinal artery occlusion; BRAO branch retinal artery occlusion; RT retinal tear; SB scleral buckle; PPV pars plana vitrectomy; VH Vitreous hemorrhage; PRP panretinal laser photocoagulation; IVK intravitreal kenalog; VMT vitreomacular traction; MH Macular hole;  NVD neovascularization of the disc; NVE neovascularization elsewhere; AREDS age related eye disease study; ARMD age related macular degeneration; POAG primary open angle glaucoma; EBMD epithelial/anterior basement membrane dystrophy; ACIOL anterior chamber intraocular lens; IOL intraocular lens; PCIOL posterior chamber intraocular lens; Phaco/IOL phacoemulsification with intraocular lens placement; [place] photorefractive keratectomy; LASIK laser assisted in situ keratomileusis; HTN hypertension; DM diabetes mellitus; COPD chronic obstructive pulmonary disease

## 2019-09-20 NOTE — Assessment & Plan Note (Addendum)
The nature of macular pucker (epiretinal membrane ERM) was discussed with the patient as well as threshold criteria for vitrectomy surgery. I explained that in rare cases another surgery is needed to actually remove a second wrinkle should it regrow.  Most often, the epiretinal membrane and underlying wrinkled internal limiting membrane are removed with the first surgery, to accomplish the goals.   If the operative eye is [place] (natural lens still present), cataract surgery is often recommended prior to Vitrectomy. This will enable the retina surgeon to have the best view during surgery and the patient to obtain optimal results in the future. Treatment options were discussed.OD, no change in macular thickening, current status accounts for acuity OD

## 2019-09-20 NOTE — Assessment & Plan Note (Signed)
This issue resolved

## 2019-09-22 DIAGNOSIS — E7800 Pure hypercholesterolemia, unspecified: Secondary | ICD-10-CM | POA: Diagnosis not present

## 2019-09-22 DIAGNOSIS — D472 Monoclonal gammopathy: Secondary | ICD-10-CM | POA: Diagnosis not present

## 2019-09-22 DIAGNOSIS — R5383 Other fatigue: Secondary | ICD-10-CM | POA: Diagnosis not present

## 2019-09-22 DIAGNOSIS — R0600 Dyspnea, unspecified: Secondary | ICD-10-CM | POA: Diagnosis not present

## 2019-09-27 DIAGNOSIS — R911 Solitary pulmonary nodule: Secondary | ICD-10-CM | POA: Diagnosis not present

## 2019-09-27 DIAGNOSIS — R0602 Shortness of breath: Secondary | ICD-10-CM | POA: Diagnosis not present

## 2019-09-27 DIAGNOSIS — D472 Monoclonal gammopathy: Secondary | ICD-10-CM | POA: Diagnosis not present

## 2019-09-27 DIAGNOSIS — R0600 Dyspnea, unspecified: Secondary | ICD-10-CM | POA: Diagnosis not present

## 2019-10-02 DIAGNOSIS — R0602 Shortness of breath: Secondary | ICD-10-CM | POA: Diagnosis not present

## 2019-10-10 DIAGNOSIS — R0600 Dyspnea, unspecified: Secondary | ICD-10-CM | POA: Diagnosis not present

## 2019-10-11 ENCOUNTER — Other Ambulatory Visit: Payer: Self-pay | Admitting: Adult Medicine

## 2019-10-11 DIAGNOSIS — R911 Solitary pulmonary nodule: Secondary | ICD-10-CM | POA: Diagnosis not present

## 2019-10-23 ENCOUNTER — Ambulatory Visit
Admission: RE | Admit: 2019-10-23 | Discharge: 2019-10-23 | Disposition: A | Discharge status: Home / Self Care | Payer: Medicare Other | Source: Ambulatory Visit | Attending: Adult Medicine | Admitting: Adult Medicine

## 2019-10-23 DIAGNOSIS — I2510 Atherosclerotic heart disease of native coronary artery without angina pectoris: Secondary | ICD-10-CM | POA: Diagnosis not present

## 2019-10-23 DIAGNOSIS — I700 Atherosclerosis of aorta: Secondary | ICD-10-CM | POA: Diagnosis not present

## 2019-10-23 DIAGNOSIS — J439 Emphysema, unspecified: Secondary | ICD-10-CM | POA: Diagnosis not present

## 2019-10-23 DIAGNOSIS — R911 Solitary pulmonary nodule: Secondary | ICD-10-CM

## 2019-10-23 DIAGNOSIS — J984 Other disorders of lung: Secondary | ICD-10-CM | POA: Diagnosis not present

## 2019-10-23 MED ORDER — IOPAMIDOL (ISOVUE-300) INJECTION 61%
75.0000 mL | Freq: Once | INTRAVENOUS | Status: AC | PRN
  Administered 2019-10-23: 75 mL via INTRAVENOUS

## 2019-11-06 DIAGNOSIS — R911 Solitary pulmonary nodule: Secondary | ICD-10-CM | POA: Diagnosis not present

## 2019-11-06 DIAGNOSIS — D472 Monoclonal gammopathy: Secondary | ICD-10-CM | POA: Diagnosis not present

## 2019-11-14 ENCOUNTER — Other Ambulatory Visit (HOSPITAL_COMMUNITY): Payer: Self-pay | Admitting: Internal Medicine

## 2019-11-14 ENCOUNTER — Other Ambulatory Visit: Payer: Self-pay | Admitting: Internal Medicine

## 2019-11-14 DIAGNOSIS — R911 Solitary pulmonary nodule: Secondary | ICD-10-CM

## 2019-11-23 ENCOUNTER — Ambulatory Visit (HOSPITAL_COMMUNITY)
Admission: RE | Admit: 2019-11-23 | Discharge: 2019-11-23 | Disposition: A | Discharge status: Home / Self Care | Payer: Medicare Other | Source: Ambulatory Visit | Attending: Internal Medicine | Admitting: Internal Medicine

## 2019-11-23 ENCOUNTER — Other Ambulatory Visit: Payer: Self-pay

## 2019-11-23 DIAGNOSIS — I2510 Atherosclerotic heart disease of native coronary artery without angina pectoris: Secondary | ICD-10-CM | POA: Diagnosis not present

## 2019-11-23 DIAGNOSIS — R911 Solitary pulmonary nodule: Secondary | ICD-10-CM

## 2019-11-23 DIAGNOSIS — I700 Atherosclerosis of aorta: Secondary | ICD-10-CM | POA: Diagnosis not present

## 2019-11-23 DIAGNOSIS — N400 Enlarged prostate without lower urinary tract symptoms: Secondary | ICD-10-CM | POA: Diagnosis not present

## 2019-11-23 DIAGNOSIS — N289 Disorder of kidney and ureter, unspecified: Secondary | ICD-10-CM | POA: Diagnosis not present

## 2019-11-23 LAB — GLUCOSE, CAPILLARY: Glucose-Capillary: 105 mg/dL — ABNORMAL HIGH (ref 70–99)

## 2019-11-23 MED ORDER — FLUDEOXYGLUCOSE F - 18 (FDG) INJECTION
6.4700 | Freq: Once | INTRAVENOUS | Status: AC | PRN
  Administered 2019-11-23: 6.47 via INTRAVENOUS

## 2019-11-26 ENCOUNTER — Encounter: Payer: Medicare Other | Admitting: Thoracic Surgery (Cardiothoracic Vascular Surgery)

## 2019-11-26 DIAGNOSIS — Z23 Encounter for immunization: Secondary | ICD-10-CM | POA: Diagnosis not present

## 2019-12-01 DIAGNOSIS — Z23 Encounter for immunization: Secondary | ICD-10-CM | POA: Diagnosis not present

## 2019-12-04 ENCOUNTER — Other Ambulatory Visit: Payer: Self-pay

## 2019-12-04 ENCOUNTER — Encounter: Payer: Self-pay | Admitting: Thoracic Surgery (Cardiothoracic Vascular Surgery)

## 2019-12-04 ENCOUNTER — Institutional Professional Consult (permissible substitution) (INDEPENDENT_AMBULATORY_CARE_PROVIDER_SITE_OTHER): Payer: Medicare Other | Admitting: Thoracic Surgery (Cardiothoracic Vascular Surgery)

## 2019-12-04 VITALS — BP 116/60 | HR 82 | Resp 18 | Wt 128.0 lb

## 2019-12-04 DIAGNOSIS — R911 Solitary pulmonary nodule: Secondary | ICD-10-CM

## 2019-12-04 NOTE — Progress Notes (Signed)
PCP is Crist Infante, MD Referring Provider is Crist Infante, MD  Chief Complaint  Patient presents with  . Lung Lesion    Lung nodule, CT chest 8/24, PET 9/24    HPI: [name] is a 86 year old man with a past medical history significant for hypertension, hyperlipidemia, anemia, arthritis, monoclonal gammopathy of unknown significance, melanoma of the left forehead (2009).  He is a lifelong non-smoker.  Recently he had an asthma attack.  He was seen at [facility] urgent care.  Chest x-ray showed a lung nodule.  A CT of the chest was done which showed a 1.1 x 1.4 x 2.8 cm slightly spiculated nodule in the right lower lobe.  There were some changes consistent with COPD.  There was no hilar or mediastinal adenopathy.  A PET/CT showed this nodule was mildly hypermetabolic with an SUV of 3.2.  There also was some focal metabolic activity in the right infrahilar region with an SUV of 4.2.  Of note but the CT PET CT showed heavy coronary calcification.  He lives alone.  He takes care of all the work around his house except his family no longer allows him to clean the gutters.  He does his own yard work.  He walks about a mile a day.  He does complain of having to clear his throat frequently when he walks, but not at other times.  He is not having any chest pain, pressure, tightness.  He did have some shortness of breath with his asthma attack in July but has not had any recent problems with that.  No change in appetite or weight loss. Zubrod Score: At the time of surgery this patient's most appropriate activity status/level should be described as: [x]     0    Normal activity, no symptoms []     1    Restricted in physical strenuous activity but ambulatory, able to do out light work []     2    Ambulatory and capable of self care, unable to do work activities, up and about >50 % of waking hours                              []     3    Only limited self care, in bed greater than 50% of waking hours []     4     Completely disabled, no self care, confined to bed or chair []     5    Moribund  Past Medical History:  Diagnosis Date  . Anemia   . Arthritis   . Cataract   . Complication following bilateral mastoidectomy   . Dysphagia    with large pills  . Gout   . HLD (hyperlipidemia)   . HTN (hypertension)   . Right posterior capsular opacification 06/21/2019  . Wrist fracture    left    Past Surgical History:  Procedure Laterality Date  . cataracts    . MASTOIDECTOMY REVISION Bilateral years ago  . ORIF WRIST FRACTURE Left 1975  . QUADRICEPS TENDON REPAIR Right 08/23/2018   Procedure: REPAIR QUADRICEP TENDON;  Surgeon: Gaynelle Arabian, MD;  Location: [facility];  Service: Orthopedics;  Laterality: Right;  56min  . TOTAL HIP ARTHROPLASTY Left 05/05/2016   Procedure: LEFT TOTAL HIP ARTHROPLASTY ANTERIOR APPROACH;  Surgeon: Gaynelle Arabian, MD;  Location: [facility];  Service: Orthopedics;  Laterality: Left;    Family History  Problem Relation Age of Onset  . Pneumonia  Mother   . Heart attack Father   . Breast cancer Sister   . Heart attack Nephew 66  . Heart failure Sister   . Kidney Stones Daughter   . Esophageal cancer Neg Hx   . Colon cancer Neg Hx   . Rectal cancer Neg Hx   . Stomach cancer Neg Hx     Social History Social History   Tobacco Use  . Smoking status: Never Smoker  . Smokeless tobacco: Never Used  Vaping Use  . Vaping Use: Never used  Substance Use Topics  . Alcohol use: No  . Drug use: No    Current Outpatient Medications  Medication Sig Dispense Refill  . amLODipine (NORVASC) 2.5 MG tablet Take 2.5 mg by mouth daily.    . Amlodipine Besylate POWD amlodipine besylate (bulk)    . aspirin EC 81 MG tablet Take 81 mg by mouth daily.    . budesonide-formoterol (SYMBICORT) 160-4.5 MCG/ACT inhaler Symbicort    . calcium carbonate (TUMS - DOSED IN MG ELEMENTAL CALCIUM) 500 MG chewable tablet Chew 1-2 tablets by mouth as needed for indigestion or heartburn.    .  Calcium-Vitamin D-Vitamin K (VIACTIV CALCIUM PLUS D) 650-12.5-40 MG-MCG-MCG CHEW Chew 1 each by mouth daily.    . Camphor-Eucalyptus-Menthol (CHEST RUB) 4.8-1.2-2.6 % OINT Vicks Vaporub    . chlorthalidone (HYGROTON) 25 MG tablet Take 12.5 mg by mouth daily.    . Cholecalciferol (VITAMIN D) 50 MCG (2000 UT) tablet Take 2,000 Units by mouth daily.    . Cyanocobalamin (B-12) 2500 MCG TABS Take 2,500 mcg by mouth daily.    . finasteride (PROPECIA) 1 MG tablet finasteride    . finasteride (PROSCAR) 5 MG tablet Take 5 mg by mouth daily.    . Multiple Minerals-Vitamins (BONE ESSENTIALS) CAPS calcium    . NON FORMULARY Take 1 tablet by mouth daily. Vision Vite - 1 tablet daily      . Omega-3 Fatty Acids (RA FISH OIL) 1000 MG CAPS Fish Oil    . polyethylene glycol (MIRALAX / GLYCOLAX) packet Take 17 g by mouth daily as needed for mild constipation.     . rosuvastatin (CRESTOR) 5 MG tablet Take 5 mg by mouth daily.    . Wheat Dextrin (BENEFIBER) POWD Take 10 mLs by mouth daily. 2 tsp Daily     Current Facility-Administered Medications  Medication Dose Route Frequency Provider Last Rate Last Admin  . 0.9 %  sodium chloride infusion  500 mL Intravenous Once Irene Shipper, MD        Allergies  Allergen Reactions  . Other Other (See Comments)    Pt can not take gel caps - med gets stuck in throat     Review of Systems  Constitutional: Negative for activity change, appetite change, chills, fever and unexpected weight change.  HENT: Negative for trouble swallowing and voice change.   Eyes: Positive for visual disturbance.  Respiratory: Positive for wheezing (July). Negative for shortness of breath.   Cardiovascular: Negative for chest pain and leg swelling.  Gastrointestinal: Negative for abdominal distention and abdominal pain.  Genitourinary: Negative.   Musculoskeletal: Positive for arthralgias.  Neurological: Negative for seizures, syncope and weakness.  Hematological: Negative for  adenopathy. Bruises/bleeds easily.    BP 116/60   Pulse 82   Resp 18   Wt 128 lb (58.1 kg)   SpO2 94% Comment: RA with mask on  BMI 21.14 kg/m  Physical Exam Vitals reviewed.  Constitutional:  General: He is not in acute distress. HENT:     Head: Normocephalic and atraumatic.     Comments: Well-healed scar left forehead Eyes:     Extraocular Movements: Extraocular movements intact.  Cardiovascular:     Rate and Rhythm: Normal rate. Rhythm irregular.     Heart sounds: Murmur (2/6 systolic murmur) heard.   Pulmonary:     Effort: Pulmonary effort is normal. No respiratory distress.     Breath sounds: No wheezing or rales.  Abdominal:     General: There is no distension.     Palpations: Abdomen is soft.  Musculoskeletal:        General: No swelling.     Cervical back: Neck supple.  Lymphadenopathy:     Cervical: No cervical adenopathy.  Skin:    General: Skin is warm and dry.  Neurological:     General: No focal deficit present.     Mental Status: He is alert and oriented to person, place, and time.     Cranial Nerves: No cranial nerve deficit.     Motor: No weakness.    Diagnostic Tests: CT CHEST WITH CONTRAST  TECHNIQUE: Multidetector CT imaging of the chest was performed during intravenous contrast administration.  CONTRAST:  90mL ISOVUE-300 IOPAMIDOL (ISOVUE-300) INJECTION 61%  COMPARISON:  None.  FINDINGS: Cardiovascular: Extensive multi-vessel coronary artery calcification. Global cardiac size is within normal limits. No pericardial effusion. Central pulmonary arteries are of normal caliber. The thoracic aorta demonstrates mild atheromatous plaque within the aortic arch and within the descending thoracic aorta without evidence of aneurysm.  Mediastinum/Nodes: No pathologic thoracic adenopathy. Thyroid gland unremarkable. Esophagus unremarkable  Lungs/Pleura: Slightly spiculated pulmonary mass is seen within the right lower lobe measuring  1.1 x 1.4 x 2.8 cm in greatest dimension on axial image # 106/4 and coronal image # 72/3 no other focal morphology nodules or infiltrates are identified. The lungs are mildly symmetrically hyperinflated in keeping with changes of underlying COPD. No pneumothorax or pleural effusion. The central airways are widely patent.  Upper Abdomen: No acute abnormality.  Musculoskeletal: No lytic or blastic bone lesions are seen.  IMPRESSION: 1. Slightly spiculated pulmonary mass within the right lower lobe measuring 1.1 x 1.4 x 2.8 cm in greatest dimension. Findings are concerning for primary bronchogenic carcinoma. Further evaluation with PET-CT is recommended if prior examinations are unavailable for direct comparison. Sign. 2. No pathologic thoracic adenopathy. 3. Extensive multi-vessel coronary artery calcification. 4. COPD 5. Aortic atherosclerosis.  Aortic Atherosclerosis (ICD10-I70.0) and Emphysema (ICD10-J43.9).   Electronically Signed   By: Fidela Salisbury MD   On: 10/23/2019 21:04 NUCLEAR MEDICINE PET SKULL BASE TO THIGH  TECHNIQUE: 6.5 mCi F-18 FDG was injected intravenously. Full-ring PET imaging was performed from the skull base to thigh after the radiotracer. CT data was obtained and used for attenuation correction and anatomic localization.  Fasting blood glucose: 106 mg/dl  COMPARISON:  Chest CT on 10/23/2019  FINDINGS: Mediastinal blood-pool activity (background): SUV max = 2.5  Liver activity (reference): SUV max = N/A  NECK:  No hypermetabolic lymph nodes or masses.  Incidental CT findings:  None.  CHEST: Spiculated nodule in the anterior right lower lobe measures 1.6 x 1.4 cm on image 47/8. This nodule shows FDG uptake, with SUV max of 3.2. No other suspicious pulmonary nodules seen on CT images.  Focal hypermetabolic activity is seen right infrahilar region, with SUV max 4.2. No other hypermetabolic lymph nodes identified within the  thorax.  Incidental CT findings: Aortic  and coronary artery atherosclerosis noted.  ABDOMEN/PELVIS: No abnormal hypermetabolic activity within the liver, pancreas, adrenal glands, or spleen. No hypermetabolic lymph nodes in the abdomen or pelvis.  Incidental CT findings: 2 cm peripherally calcified subcapsular lesion in lower pole of left kidney noted which shows no FDG uptake. Aortic atherosclerosis noted. Moderately enlarged prostate.  SKELETON: No focal hypermetabolic bone lesions to suggest skeletal metastasis.  Incidental CT findings:  None.  IMPRESSION: Mildly hypermetabolic 1.6 cm spiculated nodule in anterior right lower lobe, highly suspicious for primary bronchogenic carcinoma.  Focal hypermetabolic activity in right infrahilar region, suspicious for lymph node metastasis.  No evidence of hypermetabolic mediastinal lymph nodes or extra thoracic metastatic disease.   Electronically Signed   By: Marlaine Hind M.D.   On: 11/24/2019 10:53 I personally reviewed the CT and PET/CT images and concur with the findings noted above  Impression: Tavio Biegel is a 86 year old man with a past medical history significant for hypertension, hyperlipidemia, anemia, arthritis, monoclonal gammopathy of unknown significance, melanoma of the left forehead (2009).  He is a lifelong non-smoker.  In July of this year he had an asthma flareup.  As part of his work-up he had a chest x-ray which showed a lung nodule.  CT confirmed the nodule and on PET CT the nodule was mildly hypermetabolic.  There was some activity in the left infrahilar region but no node that correlated with that on the CT images.  Findings are concerning for a T1, N0 or N1 primary bronchogenic carcinoma.  He is a lifelong non-smoker.  Metastatic melanoma is also in the differential but I think is very unlikely.  Infectious and inflammatory nodules are also in the differential diagnosis.  I discussed 3 potential  options with Mr.Reindel and his son-in-law.  His daughter was listening in by telephone.  Options are radiographic observation, bronchoscopic biopsy, and surgical resection.  Given his age I would not recommend proceeding directly to surgical resection.  I recommended that we do a electromagnetic navigational bronchoscopy and endobronchial ultrasound to sample the nodule and also see if we can sample a node from the right infrahilar region.  I informed him of the general nature of the procedure.  It is an endoscopic procedure but we would plan to do it under general anesthesia.  We will plan to do it on an outpatient basis.  He understands there is no guarantee of a definitive diagnosis.  I informed him of the indications, risks, benefits, and alternatives.  He understands the risks include those associated with general anesthesia such as heart attack, stroke, blood clots, etc.  He understands procedure specific risks include pneumothorax and bleeding.  He wishes to think about this and will discuss with his family and will let us know whether he wants to proceed with biopsy or would prefer radiographic observation.  He does understand that there is some risk associated with treatment delay if this does turn out to be cancer.  He does have extensive thoracic aortic atherosclerosis and coronary artery calcification on CT.  He is not symptomatic from either.  Neither of those findings are particular surprising given his age.  No further work-up indicated at this time.  Plan: Patient will call to schedule electromagnetic navigational bronchoscopy and endobronchial ultrasound.  [name], MD [facility] [phone]

## 2019-12-21 DIAGNOSIS — D225 Melanocytic nevi of trunk: Secondary | ICD-10-CM | POA: Diagnosis not present

## 2019-12-21 DIAGNOSIS — L814 Other melanin hyperpigmentation: Secondary | ICD-10-CM | POA: Diagnosis not present

## 2019-12-21 DIAGNOSIS — L570 Actinic keratosis: Secondary | ICD-10-CM | POA: Diagnosis not present

## 2019-12-21 DIAGNOSIS — Z85828 Personal history of other malignant neoplasm of skin: Secondary | ICD-10-CM | POA: Diagnosis not present

## 2019-12-21 DIAGNOSIS — L821 Other seborrheic keratosis: Secondary | ICD-10-CM | POA: Diagnosis not present

## 2019-12-21 DIAGNOSIS — D1801 Hemangioma of skin and subcutaneous tissue: Secondary | ICD-10-CM | POA: Diagnosis not present

## 2020-01-02 DIAGNOSIS — R0981 Nasal congestion: Secondary | ICD-10-CM | POA: Diagnosis not present

## 2020-01-02 DIAGNOSIS — R059 Cough, unspecified: Secondary | ICD-10-CM | POA: Diagnosis not present

## 2020-01-02 DIAGNOSIS — N1831 Chronic kidney disease, stage 3a: Secondary | ICD-10-CM | POA: Diagnosis not present

## 2020-01-02 DIAGNOSIS — J069 Acute upper respiratory infection, unspecified: Secondary | ICD-10-CM | POA: Diagnosis not present

## 2020-01-02 DIAGNOSIS — I129 Hypertensive chronic kidney disease with stage 1 through stage 4 chronic kidney disease, or unspecified chronic kidney disease: Secondary | ICD-10-CM | POA: Diagnosis not present

## 2020-01-02 DIAGNOSIS — Z1152 Encounter for screening for COVID-19: Secondary | ICD-10-CM | POA: Diagnosis not present

## 2020-01-03 DIAGNOSIS — E785 Hyperlipidemia, unspecified: Secondary | ICD-10-CM | POA: Diagnosis not present

## 2020-01-03 DIAGNOSIS — M859 Disorder of bone density and structure, unspecified: Secondary | ICD-10-CM | POA: Diagnosis not present

## 2020-01-03 DIAGNOSIS — R946 Abnormal results of thyroid function studies: Secondary | ICD-10-CM | POA: Diagnosis not present

## 2020-01-03 DIAGNOSIS — R7301 Impaired fasting glucose: Secondary | ICD-10-CM | POA: Diagnosis not present

## 2020-01-03 DIAGNOSIS — Z125 Encounter for screening for malignant neoplasm of prostate: Secondary | ICD-10-CM | POA: Diagnosis not present

## 2020-01-10 DIAGNOSIS — J45909 Unspecified asthma, uncomplicated: Secondary | ICD-10-CM | POA: Diagnosis not present

## 2020-01-10 DIAGNOSIS — M8580 Other specified disorders of bone density and structure, unspecified site: Secondary | ICD-10-CM | POA: Diagnosis not present

## 2020-01-10 DIAGNOSIS — R911 Solitary pulmonary nodule: Secondary | ICD-10-CM | POA: Diagnosis not present

## 2020-01-10 DIAGNOSIS — R7301 Impaired fasting glucose: Secondary | ICD-10-CM | POA: Diagnosis not present

## 2020-01-10 DIAGNOSIS — D472 Monoclonal gammopathy: Secondary | ICD-10-CM | POA: Diagnosis not present

## 2020-01-10 DIAGNOSIS — Z0000 Encounter for general adult medical examination without abnormal findings: Secondary | ICD-10-CM | POA: Diagnosis not present

## 2020-01-10 DIAGNOSIS — I10 Essential (primary) hypertension: Secondary | ICD-10-CM | POA: Diagnosis not present

## 2020-01-10 DIAGNOSIS — Z1331 Encounter for screening for depression: Secondary | ICD-10-CM | POA: Diagnosis not present

## 2020-01-10 DIAGNOSIS — R82998 Other abnormal findings in urine: Secondary | ICD-10-CM | POA: Diagnosis not present

## 2020-01-10 DIAGNOSIS — N1831 Chronic kidney disease, stage 3a: Secondary | ICD-10-CM | POA: Diagnosis not present

## 2020-01-10 DIAGNOSIS — I129 Hypertensive chronic kidney disease with stage 1 through stage 4 chronic kidney disease, or unspecified chronic kidney disease: Secondary | ICD-10-CM | POA: Diagnosis not present

## 2020-01-10 DIAGNOSIS — E785 Hyperlipidemia, unspecified: Secondary | ICD-10-CM | POA: Diagnosis not present

## 2020-01-14 ENCOUNTER — Other Ambulatory Visit: Payer: Self-pay | Admitting: *Deleted

## 2020-01-14 DIAGNOSIS — R911 Solitary pulmonary nodule: Secondary | ICD-10-CM

## 2020-01-15 ENCOUNTER — Other Ambulatory Visit: Payer: Self-pay | Admitting: *Deleted

## 2020-01-15 DIAGNOSIS — R911 Solitary pulmonary nodule: Secondary | ICD-10-CM

## 2020-02-19 ENCOUNTER — Ambulatory Visit
Admission: RE | Admit: 2020-02-19 | Discharge: 2020-02-19 | Disposition: A | Discharge status: Home / Self Care | Payer: Medicare Other | Source: Ambulatory Visit | Attending: Thoracic Surgery (Cardiothoracic Vascular Surgery) | Admitting: Thoracic Surgery (Cardiothoracic Vascular Surgery)

## 2020-02-19 ENCOUNTER — Inpatient Hospital Stay: Admission: RE | Admit: 2020-02-19 | Payer: Medicare Other | Source: Ambulatory Visit

## 2020-02-19 ENCOUNTER — Other Ambulatory Visit: Payer: Self-pay

## 2020-02-19 DIAGNOSIS — R911 Solitary pulmonary nodule: Secondary | ICD-10-CM

## 2020-02-19 DIAGNOSIS — J189 Pneumonia, unspecified organism: Secondary | ICD-10-CM | POA: Diagnosis not present

## 2020-02-19 DIAGNOSIS — I358 Other nonrheumatic aortic valve disorders: Secondary | ICD-10-CM | POA: Diagnosis not present

## 2020-02-19 DIAGNOSIS — I2510 Atherosclerotic heart disease of native coronary artery without angina pectoris: Secondary | ICD-10-CM | POA: Diagnosis not present

## 2020-02-19 DIAGNOSIS — I700 Atherosclerosis of aorta: Secondary | ICD-10-CM | POA: Diagnosis not present

## 2020-02-19 HISTORY — DX: Solitary pulmonary nodule: R91.1

## 2020-02-26 ENCOUNTER — Other Ambulatory Visit: Payer: Self-pay

## 2020-02-26 ENCOUNTER — Institutional Professional Consult (permissible substitution): Payer: Medicare Other | Admitting: Radiation Oncology

## 2020-02-26 ENCOUNTER — Ambulatory Visit (INDEPENDENT_AMBULATORY_CARE_PROVIDER_SITE_OTHER): Payer: Medicare Other | Admitting: Thoracic Surgery (Cardiothoracic Vascular Surgery)

## 2020-02-26 VITALS — BP 133/71 | HR 73 | Resp 20

## 2020-02-26 DIAGNOSIS — R911 Solitary pulmonary nodule: Secondary | ICD-10-CM

## 2020-02-26 NOTE — Progress Notes (Signed)
[name],[name] [id number]             [phone]     HPI: [name] returns to further discuss management of his lung nodule.  [name] is a 86 year old man with a history of hypertension, hyperlipidemia, anemia, monoclonal gammopathy, melanoma of the left forehead, and arthritis. He is a lifelong non-smoker. He recently had an asthma attack and was seen at [facility] urgent care. A chest x-ray showed a lung nodule. On CT of the chest he had a 1.4 cm spiculated nodule in the right lower lobe with no mediastinal or hilar adenopathy. On PET CT the nodule is mildly hypermetabolic with an SUV of 3.2. There also was an area of metabolic activity in the right infrahilar region with an SUV of 4.2 but no CT correlate.  I saw him back in October and discussed possible navigational bronchoscopy and endobronchial ultrasound. He wished to have some time to talk with his family and now returns to further discuss management of the nodule.  Diagnostic Tests: CT CHEST WITHOUT CONTRAST  TECHNIQUE: Multidetector CT imaging of the chest was performed using thin slice collimation for electromagnetic bronchoscopy planning purposes, without intravenous contrast.  COMPARISON:  CT chest, 10/23/2019, PET-CT, 11/23/2019  FINDINGS: Cardiovascular: Aortic atherosclerosis. Aortic valve calcifications. Normal heart size. Three-vessel coronary artery calcifications and/or stents. No pericardial effusion.  Mediastinum/Nodes: No enlarged mediastinal, hilar, or axillary lymph nodes. Thyroid gland, trachea, and esophagus demonstrate no significant findings.  Lungs/Pleura: Redemonstrated, lobulated pulmonary nodule of the anterior right lower lobe abutting the major fissure measures 1.4 x 1.3 cm, not significantly changed compared to prior examination when measured similarly (series 4, image 111). Of note, there is a heterogeneously hypodense internal appearance of this  nodule, with macroscopic fat attenuation identified (HU = -8). Unchanged adjacent 3 mm pulmonary nodule (series 4, image 110). Minimal nodularity and consolidation of the medial segment right middle lobe and lingula (series 4, image 127). Scattered bronchiolar plugging throughout. No pleural effusion or pneumothorax.  Upper Abdomen: No acute abnormality.  Musculoskeletal: No chest wall mass or suspicious bone lesions identified.  IMPRESSION: 1. Redemonstrated, lobulated pulmonary nodule of the anterior right lower lobe abutting the major fissure measures 1.4 x 1.3 cm, not significantly changed compared to prior examination when measured similarly. Of note, there is a heterogeneously hypodense internal appearance of this nodule, with macroscopic fat attenuation identified (HU -8), raising the possibility of a benign hamartoma. This does however remain somewhat concerning for malignancy given low-grade FDG avidity identified on prior PET-CT. 2. Unchanged adjacent 3 mm pulmonary nodule. 3. Minimal nodularity and consolidation of the medial segment right middle lobe and lingula, consistent with sequelae of prior atypical infection, particularly atypical mycobacterium. 4. Coronary artery disease.  Aortic Atherosclerosis (ICD10-I70.0).   Electronically Signed   By: Eddie Candle M.D.   On: 02/19/2020 13:16 I personally reviewed the CT images and concur with the findings noted above. I think benign hamartoma is unlikely.  Impression: [name] is a 86 year old man with a history of hypertension, hyperlipidemia, anemia, monoclonal gammopathy, melanoma of the left forehead, and arthritis. He is a lifelong non-smoker.   He was found to have a right lower lobe lung nodule in August when he was being evaluated for shortness of breath. A PET/CT showed low-grade metabolic activity. There also was some hilar activity but no noted on CT to correlate with that. Findings are suspicious for  a T1, N0 or N1 non-small cell carcinoma. I saw him back in October and he was reluctant to pursue this in any fashion. However he was willing to have a repeat CT scan and now returns to discuss the results.  His CT does not show any dramatic change. There are some question this could be a hamartoma. However the level of activity on PET would not be typical for hamartoma, so I doubt that is the case. I still think this most likely is a new primary bronchogenic carcinoma. Infectious and inflammatory nodules are also in the differential diagnosis. I still think the best approach would be to do a navigational bronchoscopy and endobronchial ultrasound for diagnostic purposes.  He very strongly does not want to have anything invasive done including a bronchoscopy. He is willing to talk with radiation oncology to see if they might be willing to treat him without a biopsy. I will arrange for that consultation.  Plan: Arrange for consultation with Dr. Lisbeth Renshaw of radiation oncology to consider empiric treatment of right lower lobe lung nodule.  I spent over 20 minutes in review of records, images, and in consultation with [name] during his visit today. [name], MD [facility] [phone]

## 2020-03-05 NOTE — Progress Notes (Signed)
Thoracic Location of Tumor / Histology: Right Lower Lobe Lung  Patient presented in July 2021 with an asthma attack.  Work-up done and CT chest revealed lung nodule.  CT Super D Chest 02/19/2020: Redemonstrated, lobulated pulmonary nodule of the anterior right lower lobe abutting the major fissure measures 1.4 x 1.3 cm, not significantly changed compared to prior examination when measured similarly. Of note, there is a heterogeneously hypodense internal appearance of this nodule, with macroscopic fat attenuation identified, raising the possibility of a benign hamartoma. This does however remain somewhat concerning for malignancy given low-grade FDG avidity identified on prior PET-CT.  Unchanged adjacent 3 mm pulmonary nodule.  PET 11/23/2019: Mildly hypermetabolic 1.6 cm spiculated nodule in anterior right lower lobe, highly suspicious for primary bronchogenic carcinoma.  Focal hypermetabolic activity in right infrahilar region, suspicious for lymph node metastasis.  No evidence of hypermetabolic mediastinal lymph nodes or extra thoracic metastatic disease.    CT Chest 10/23/2019: 1.1 x 1.4 x 2.8 cm slightly spiculated nodule in the right lower lobe.  There were some changes consistent with COPD.    Biopsies:  No biopsy  Tobacco/Marijuana/Snuff/ETOH use: Non smoker  Past/Anticipated interventions by cardiothoracic surgery, if any:  Dr. Roxan Hockey 02/26/2020 -His CT does not show any dramatic change. There are some question this could be a hamartoma. However the level of activity on PET would not be typical for hamartoma, so I doubt that is the case. - I still think this most likely is a new primary bronchogenic carcinoma. Infectious and inflammatory nodules are also in the differential diagnosis. - I still think the best approach would be to do a navigational bronchoscopy and endobronchial ultrasound for diagnostic purposes. -He very strongly does not want to have anything invasive done including  a bronchoscopy. He is willing to talk with radiation oncology to see if they might be willing to treat him without a biopsy. I will arrange for that consultation. 12/04/2019 -Options are radiographic observation, bronchoscopic biopsy, and surgical resection.  -Given his age I would not recommend proceeding directly to surgical resection.  - I recommended that we do a electromagnetic navigational bronchoscopy and endobronchial ultrasound to sample the nodule and also see if we can sample a node from the right infrahilar region.  -He wishes to think about this and will discuss with his family and will let us know whether he wants to proceed with biopsy or would prefer radiographic observation.  He does understand that there is some risk associated with treatment delay if this does turn out to be cancer.  Past/Anticipated interventions by medical oncology, if any:   Signs/Symptoms  Weight changes, if any: No  Respiratory complaints, if any: No  Hemoptysis, if any: Has occasional productive cough with thick clear mucous.  He also complains of post nasal drip and nagging dry cough on occasion.  Pain issues, if any:  No   SAFETY ISSUES:  Prior radiation? no  Pacemaker/ICD? no  Possible current pregnancy? n/a  Is the patient on methotrexate? no  Current Complaints / other details:   -Melanoma of left forehead

## 2020-03-06 ENCOUNTER — Other Ambulatory Visit: Payer: Self-pay

## 2020-03-06 ENCOUNTER — Ambulatory Visit
Admission: RE | Admit: 2020-03-06 | Discharge: 2020-03-06 | Disposition: A | Discharge status: Home / Self Care | Payer: Medicare Other | Source: Ambulatory Visit | Attending: Radiation Oncology | Admitting: Radiation Oncology

## 2020-03-06 ENCOUNTER — Encounter: Payer: Self-pay | Admitting: Radiation Oncology

## 2020-03-06 VITALS — BP 128/66 | HR 71 | Temp 96.9°F | Resp 18 | Ht 67.0 in | Wt 133.6 lb

## 2020-03-06 DIAGNOSIS — E785 Hyperlipidemia, unspecified: Secondary | ICD-10-CM | POA: Diagnosis not present

## 2020-03-06 DIAGNOSIS — I10 Essential (primary) hypertension: Secondary | ICD-10-CM | POA: Insufficient documentation

## 2020-03-06 DIAGNOSIS — D649 Anemia, unspecified: Secondary | ICD-10-CM | POA: Insufficient documentation

## 2020-03-06 DIAGNOSIS — C3431 Malignant neoplasm of lower lobe, right bronchus or lung: Secondary | ICD-10-CM | POA: Insufficient documentation

## 2020-03-06 DIAGNOSIS — I2510 Atherosclerotic heart disease of native coronary artery without angina pectoris: Secondary | ICD-10-CM | POA: Diagnosis not present

## 2020-03-06 DIAGNOSIS — Z79899 Other long term (current) drug therapy: Secondary | ICD-10-CM | POA: Insufficient documentation

## 2020-03-06 DIAGNOSIS — R911 Solitary pulmonary nodule: Secondary | ICD-10-CM

## 2020-03-06 DIAGNOSIS — I700 Atherosclerosis of aorta: Secondary | ICD-10-CM | POA: Insufficient documentation

## 2020-03-06 DIAGNOSIS — Z7982 Long term (current) use of aspirin: Secondary | ICD-10-CM | POA: Insufficient documentation

## 2020-03-06 DIAGNOSIS — Z85820 Personal history of malignant melanoma of skin: Secondary | ICD-10-CM | POA: Diagnosis not present

## 2020-03-06 DIAGNOSIS — Z803 Family history of malignant neoplasm of breast: Secondary | ICD-10-CM | POA: Diagnosis not present

## 2020-03-07 NOTE — Progress Notes (Deleted)
Radiation Oncology         [id number]) [phone] ________________________________  Name: [name].        MRN: [id number]  Date of Service: 03/06/2020 DOB: 1924/04/08  HC:[id number], [name], MD  [name], *     REFERRING PHYSICIAN: [name], *   DIAGNOSIS: The primary encounter diagnosis was Nodule of lower lobe of right lung. A diagnosis of Malignant neoplasm of bronchus of right lower lobe [facility]) was also pertinent to this visit.   HISTORY OF PRESENT ILLNESS: [name]. is a 87 y.o. male seen at the request of Dr. Roxan Hockey for a putative stage I lung cancer of the right lower lobe.  The patient has a longstanding history of smoking,and is a lifelong smoker.  He had a asthma attack several months ago, which prompted a chest x-ray at [facility] that revealed a nodule in the lung.  He proceeded with CT imaging on 10/23/2019 which revealed a 1.1 x 1.4 x 2.8 cm nodule that was spiculated in the right lower lobe, a PET scan on 11/23/2019 revealed an SUV of 3.2 and the nodule in the right lower lobe measured 1.6 x 1.4 cm, blood pool activity was 2.5.  There was also focal hypermetabolic activity in the right infrahilar region with an SUV max of 4.2 however there was no CT correlate for this.  He was counseled on options of proceeding with biopsy but given his age and comorbidities declined, he did have a repeat CT scan with super D component on 02/19/2020 which again demonstrates the lobulated pulmonary nodule in the right lower lobe which abuts the major fissure measuring 1.4 x 1.3 cm, it was not significantly changed but there was more hypodensity of the internal appearance of the nodule. He is not interested in surgical resection and is seen today to discuss options of treatment of his putative cancer without tissue confirmation   PREVIOUS RADIATION THERAPY: No   PAST MEDICAL HISTORY:  Past Medical History:  Diagnosis Date  . Anemia   . Arthritis   .  Cataract   . Complication following bilateral mastoidectomy   . Dysphagia    with large pills  . Gout   . HLD (hyperlipidemia)   . HTN (hypertension)   . Lung nodule 02/19/2020  . Melanoma ([place]) 2009   Left forehead  . Right posterior capsular opacification 06/21/2019  . Wrist fracture    left       PAST SURGICAL HISTORY: Past Surgical History:  Procedure Laterality Date  . cataracts    . MASTOIDECTOMY REVISION Bilateral years ago  . ORIF WRIST FRACTURE Left 1975  . QUADRICEPS TENDON REPAIR Right 08/23/2018   Procedure: REPAIR QUADRICEP TENDON;  Surgeon: Gaynelle Arabian, MD;  Location: [facility] ORS;  Service: Orthopedics;  Laterality: Right;  80min  . TOTAL HIP ARTHROPLASTY Left 05/05/2016   Procedure: LEFT TOTAL HIP ARTHROPLASTY ANTERIOR APPROACH;  Surgeon: Gaynelle Arabian, MD;  Location: [facility];  Service: Orthopedics;  Laterality: Left;     FAMILY HISTORY:  Family History  Problem Relation Age of Onset  . Pneumonia Mother   . Heart attack Father   . Breast cancer Sister   . Heart attack Nephew 66  . Heart failure Sister   . Kidney Stones Daughter   . Esophageal cancer Neg Hx   . Colon cancer Neg Hx   . Rectal cancer Neg Hx   . Stomach cancer Neg Hx      SOCIAL  HISTORY:  reports that he has never smoked. He has never used smokeless tobacco. He reports that he does not drink alcohol and does not use drugs.   ALLERGIES: Other   MEDICATIONS:  Current Outpatient Medications  Medication Sig Dispense Refill  . amLODipine (NORVASC) 2.5 MG tablet Take 2.5 mg by mouth daily.    . Amlodipine Besylate POWD amlodipine besylate (bulk)    . aspirin EC 81 MG tablet Take 81 mg by mouth daily.    . budesonide-formoterol (SYMBICORT) 160-4.5 MCG/ACT inhaler Symbicort    . calcium carbonate (TUMS - DOSED IN MG ELEMENTAL CALCIUM) 500 MG chewable tablet Chew 1-2 tablets by mouth as needed for indigestion or heartburn.    . Calcium-Vitamin D-Vitamin K (VIACTIV CALCIUM PLUS D) 650-12.5-40  MG-MCG-MCG CHEW Chew 1 each by mouth daily.    . Camphor-Eucalyptus-Menthol (CHEST RUB) 4.8-1.2-2.6 % OINT Vicks Vaporub    . chlorthalidone (HYGROTON) 25 MG tablet Take 12.5 mg by mouth daily.    . Cholecalciferol (VITAMIN D) 50 MCG (2000 UT) tablet Take 2,000 Units by mouth daily.    . Cyanocobalamin (B-12) 2500 MCG TABS Take 2,500 mcg by mouth daily.    . finasteride (PROPECIA) 1 MG tablet finasteride    . finasteride (PROSCAR) 5 MG tablet Take 5 mg by mouth daily.    . Multiple Minerals-Vitamins (BONE ESSENTIALS) CAPS calcium    . NON FORMULARY Take 1 tablet by mouth daily. Vision Vite - 1 tablet daily    . Omega-3 Fatty Acids (RA FISH OIL) 1000 MG CAPS Fish Oil    . polyethylene glycol (MIRALAX / GLYCOLAX) packet Take 17 g by mouth daily as needed for mild constipation.     . rosuvastatin (CRESTOR) 5 MG tablet Take 5 mg by mouth daily.    . Wheat Dextrin (BENEFIBER) POWD Take 10 mLs by mouth daily. 2 tsp Daily     Current Facility-Administered Medications  Medication Dose Route Frequency Provider Last Rate Last Admin  . 0.9 %  sodium chloride infusion  500 mL Intravenous Once Irene Shipper, MD         REVIEW OF SYSTEMS: On review of systems, the patient reports that he is doing well overall. He denies any chest pain, shortness of breath, cough, fevers, chills, night sweats, unintended weight changes. He denies any bowel or bladder disturbances, and denies abdominal pain, nausea or vomiting. He denies any new musculoskeletal or joint aches or pains. A complete review of systems is obtained and is otherwise negative.     PHYSICAL EXAM:  Wt Readings from Last 3 Encounters:  03/06/20 133 lb 9.6 oz (60.6 kg)  12/04/19 128 lb (58.1 kg)  02/14/19 129 lb (58.5 kg)   Temp Readings from Last 3 Encounters:  03/06/20 (!) 96.9 F (36.1 C) (Temporal)  02/08/19 97.8 F (36.6 C)  08/24/18 98.5 F (36.9 C)   BP Readings from Last 3 Encounters:  03/06/20 128/66  02/26/20 133/71  12/04/19  116/60   Pulse Readings from Last 3 Encounters:  03/06/20 71  02/26/20 73  12/04/19 82   Pain Assessment Pain Score: 0-No pain/10  In general this is a well appearing caucasian male in no acute distress. He's alert and oriented x4 and appropriate throughout the examination. Cardiopulmonary assessment is negative for acute distress and he exhibits normal effort.    ECOG = 0  0 - Asymptomatic (Fully active, able to carry on all predisease activities without restriction)  1 - Symptomatic but completely ambulatory (Restricted  in physically strenuous activity but ambulatory and able to carry out work of a light or sedentary nature. For example, light housework, office work)  2 - Symptomatic, <50% in bed during the day (Ambulatory and capable of all self care but unable to carry out any work activities. Up and about more than 50% of waking hours)  3 - Symptomatic, >50% in bed, but not bedbound (Capable of only limited self-care, confined to bed or chair 50% or more of waking hours)  4 - Bedbound (Completely disabled. Cannot carry on any self-care. Totally confined to bed or chair)  5 - Death   Eustace Pen MM, Creech RH, Tormey DC, et al. [phone]). "Toxicity and response criteria of the [facility]". [employer] Oncol. 5 (6): 649-55    LABORATORY DATA:  Lab Results  Component Value Date   WBC 5.3 08/21/2018   HGB 11.2 (L) 08/21/2018   HCT 35.0 (L) 08/21/2018   MCV 101.7 (H) 08/21/2018   PLT 236 08/21/2018   Lab Results  Component Value Date   NA 141 08/21/2018   K 4.1 08/21/2018   CL 103 08/21/2018   CO2 28 08/21/2018   Lab Results  Component Value Date   ALT 17 08/21/2018   AST 22 08/21/2018   ALKPHOS 56 08/21/2018   BILITOT 0.4 08/21/2018      RADIOGRAPHY: CT Super D Chest Wo Contrast  Result Date: 02/19/2020 CLINICAL DATA:  Follow-up right lower lobe lung nodule EXAM: CT CHEST WITHOUT CONTRAST TECHNIQUE: Multidetector CT imaging of the chest was  performed using thin slice collimation for electromagnetic bronchoscopy planning purposes, without intravenous contrast. COMPARISON:  CT chest, 10/23/2019, PET-CT, 11/23/2019 FINDINGS: Cardiovascular: Aortic atherosclerosis. Aortic valve calcifications. Normal heart size. Three-vessel coronary artery calcifications and/or stents. No pericardial effusion. Mediastinum/Nodes: No enlarged mediastinal, hilar, or axillary lymph nodes. Thyroid gland, trachea, and esophagus demonstrate no significant findings. Lungs/Pleura: Redemonstrated, lobulated pulmonary nodule of the anterior right lower lobe abutting the major fissure measures 1.4 x 1.3 cm, not significantly changed compared to prior examination when measured similarly (series 4, image 111). Of note, there is a heterogeneously hypodense internal appearance of this nodule, with macroscopic fat attenuation identified (HU = -8). Unchanged adjacent 3 mm pulmonary nodule (series 4, image 110). Minimal nodularity and consolidation of the medial segment right middle lobe and lingula (series 4, image 127). Scattered bronchiolar plugging throughout. No pleural effusion or pneumothorax. Upper Abdomen: No acute abnormality. Musculoskeletal: No chest wall mass or suspicious bone lesions identified. IMPRESSION: 1. Redemonstrated, lobulated pulmonary nodule of the anterior right lower lobe abutting the major fissure measures 1.4 x 1.3 cm, not significantly changed compared to prior examination when measured similarly. Of note, there is a heterogeneously hypodense internal appearance of this nodule, with macroscopic fat attenuation identified (HU -8), raising the possibility of a benign hamartoma. This does however remain somewhat concerning for malignancy given low-grade FDG avidity identified on prior PET-CT. 2. Unchanged adjacent 3 mm pulmonary nodule. 3. Minimal nodularity and consolidation of the medial segment right middle lobe and lingula, consistent with sequelae of prior  atypical infection, particularly atypical mycobacterium. 4. Coronary artery disease. Aortic Atherosclerosis (ICD10-I70.0). Electronically Signed   By: Eddie Candle M.D.   On: 02/19/2020 13:16       IMPRESSION/PLAN: 1. Putative Stage IA2, cT1bN0M0 NSCLC of the RLL. Dr. Lisbeth Renshaw discusses the nature of early cancer diagnosis and the presumed diagnosis and findings thus far from imaging. Dr. Lisbeth Renshaw discussed the options of continue to follow  this finding, versus proceeding with definitve stereotactic body radiotherapy (SBRT) without tissue diagnosis. The patient and his son in law are aware of the limitations of not having tissue confirmation prior to proceeding but are interested in proceeding with radiotherapy. We discussed the risks, benefits, short, and long term effects of radiotherapy, as well as the curative intent. Dr. Lisbeth Renshaw discusses the delivery and logistics of radiotherapy and anticipates a course of 3 to 5 fractions weeks of radiotherapy. Written consent is obtained and placed in the chart, a copy was provided to the patient. He will be contacted for simulation in the near future.  In a visit lasting 60 minutes, greater than 50% of the time was spent face to face with Dr. Lisbeth Renshaw and via Jackquline [place] with myself discussing the patient's condition, in preparation for the discussion, and coordinating the patient's care.    The above documentation reflects my direct findings during this shared patient visit. Please see the separate note by Dr. Lisbeth Renshaw on this date for the remainder of the patient's plan of care.    Carola Rhine, PAC

## 2020-03-14 ENCOUNTER — Other Ambulatory Visit: Payer: Self-pay

## 2020-03-14 ENCOUNTER — Ambulatory Visit
Admission: RE | Admit: 2020-03-14 | Discharge: 2020-03-14 | Disposition: A | Discharge status: Home / Self Care | Payer: Medicare Other | Source: Ambulatory Visit | Attending: Radiation Oncology | Admitting: Radiation Oncology

## 2020-03-14 DIAGNOSIS — Z510 Encounter for antineoplastic radiation therapy: Secondary | ICD-10-CM | POA: Insufficient documentation

## 2020-03-14 DIAGNOSIS — C3431 Malignant neoplasm of lower lobe, right bronchus or lung: Secondary | ICD-10-CM | POA: Diagnosis not present

## 2020-03-21 DIAGNOSIS — Z510 Encounter for antineoplastic radiation therapy: Secondary | ICD-10-CM | POA: Diagnosis not present

## 2020-03-21 DIAGNOSIS — C3431 Malignant neoplasm of lower lobe, right bronchus or lung: Secondary | ICD-10-CM | POA: Diagnosis not present

## 2020-03-24 ENCOUNTER — Ambulatory Visit: Payer: Medicare Other | Admitting: Radiation Oncology

## 2020-03-24 ENCOUNTER — Encounter (INDEPENDENT_AMBULATORY_CARE_PROVIDER_SITE_OTHER): Payer: Medicare Other | Admitting: Ophthalmology

## 2020-03-25 ENCOUNTER — Ambulatory Visit
Admission: RE | Admit: 2020-03-25 | Discharge: 2020-03-25 | Disposition: A | Discharge status: Home / Self Care | Payer: Medicare Other | Source: Ambulatory Visit | Attending: Radiation Oncology | Admitting: Radiation Oncology

## 2020-03-25 ENCOUNTER — Other Ambulatory Visit: Payer: Self-pay

## 2020-03-25 DIAGNOSIS — Z510 Encounter for antineoplastic radiation therapy: Secondary | ICD-10-CM | POA: Diagnosis not present

## 2020-03-25 DIAGNOSIS — C3431 Malignant neoplasm of lower lobe, right bronchus or lung: Secondary | ICD-10-CM | POA: Diagnosis not present

## 2020-03-26 ENCOUNTER — Ambulatory Visit: Payer: Medicare Other | Admitting: Radiation Oncology

## 2020-03-27 ENCOUNTER — Other Ambulatory Visit: Payer: Self-pay

## 2020-03-27 ENCOUNTER — Ambulatory Visit
Admission: RE | Admit: 2020-03-27 | Discharge: 2020-03-27 | Disposition: A | Discharge status: Home / Self Care | Payer: Medicare Other | Source: Ambulatory Visit | Attending: Radiation Oncology | Admitting: Radiation Oncology

## 2020-03-27 DIAGNOSIS — Z510 Encounter for antineoplastic radiation therapy: Secondary | ICD-10-CM | POA: Diagnosis not present

## 2020-03-27 DIAGNOSIS — C3431 Malignant neoplasm of lower lobe, right bronchus or lung: Secondary | ICD-10-CM | POA: Diagnosis not present

## 2020-03-31 ENCOUNTER — Ambulatory Visit: Payer: Medicare Other | Admitting: Radiation Oncology

## 2020-04-01 ENCOUNTER — Other Ambulatory Visit: Payer: Self-pay

## 2020-04-01 ENCOUNTER — Ambulatory Visit
Admission: RE | Admit: 2020-04-01 | Discharge: 2020-04-01 | Disposition: A | Discharge status: Home / Self Care | Payer: Medicare Other | Source: Ambulatory Visit | Attending: Radiation Oncology | Admitting: Radiation Oncology

## 2020-04-01 ENCOUNTER — Other Ambulatory Visit: Payer: Self-pay | Admitting: Radiation Oncology

## 2020-04-01 DIAGNOSIS — Z510 Encounter for antineoplastic radiation therapy: Secondary | ICD-10-CM | POA: Diagnosis not present

## 2020-04-01 DIAGNOSIS — R911 Solitary pulmonary nodule: Secondary | ICD-10-CM

## 2020-04-01 DIAGNOSIS — C3431 Malignant neoplasm of lower lobe, right bronchus or lung: Secondary | ICD-10-CM | POA: Diagnosis not present

## 2020-04-01 NOTE — Progress Notes (Signed)
Pt seen today at completion of SBRT. EOT note to follow.      Carola Rhine, PAC

## 2020-04-01 NOTE — Progress Notes (Signed)
Attestation signed by Kyung Rudd, MD at 03/07/2020  6:04 PM    Please see the note from [name], PA-C from today's visit for more details of today's encounter.  I have personally performed a face to face diagnostic evaluation on this patient and devised the following assessment and plan.     The patient underwent evaluation today for what appears to represent an early likely non-small cell lung cancer.  The patient has been found to have a nodule with in the right lower lobe.  This measured 1.6 cm and showed hypermetabolic activity on recent PET scan.  Subsequent CT scan confirmed the persistence of this tumor.  Some focal hypermetabolic activity was commented on within the right infrahilar region.  No correlate was seen on CT at the time of the PET scan and subsequent CT scan 3 months later also did not show any abnormal lymphadenopathy.  The patient therefore appears to have a stage I non-small cell lung cancer.  Further work-up to confirm this was discussed with the patient by cardiothoracic surgery but the patient declined further work-up given his age and goals of care.  He is interested in possible noninvasive measures and therefore I have been asked to see the patient to consider stereotactic body radiation treatment.  We discussed the potential rationale of such a treatment as well as the risks and side effects.  We also discussed additional options such as reimaging in several months once again to continue to follow this area.  After this discussion, the patient indicated that he did wish to go ahead and proceed with stereotactic body radiation treatment, which I believe is reasonable given his goals of care and current clinical information.  The patient likely would receive a 3 fraction course of radiation treatment.  He will therefore undergo simulation in the near future for treatment planning.  Staging-clinical stage I non-small cell lung cancer of the right lower lobe         Kyung Rudd, MD              Radiation Oncology         [phone] ________________________________    Name: [name].              MRN: [id number]   Date of Service: 03/06/2020 DOB: 11/28/1924     DX:IPJASN, [name], MD  [name], *        REFERRING PHYSICIAN: [name], *  DIAGNOSIS: The primary encounter diagnosis was Nodule of lower lobe of right lung. A diagnosis of Malignant neoplasm of bronchus of right lower lobe [facility]) was also pertinent to this visit.  HISTORY OF PRESENT ILLNESS: [name]. is a 87 y.o. male seen at the request of Dr. Roxan Hockey for a putative stage I lung cancer of the right lower lobe.  The patient has a longstanding history of smoking,and is a lifelong smoker.  He had a asthma attack several months ago, which prompted a chest x-ray at [facility] that revealed a nodule in the lung.  He proceeded with CT imaging on 10/23/2019 which revealed a 1.1 x 1.4 x 2.8 cm nodule that was spiculated in the right lower lobe, a PET scan on 11/23/2019 revealed an SUV of 3.2 and the nodule in the right lower lobe measured 1.6 x 1.4 cm, blood pool activity was 2.5.  There was also focal hypermetabolic activity in the right infrahilar region with an SUV max of 4.2  however there was no CT correlate for this.  He was counseled on options of proceeding with biopsy but given his age and comorbidities declined, he did have a repeat CT scan with super D component on 02/19/2020 which again demonstrates the lobulated pulmonary nodule in the right lower lobe which abuts the major fissure measuring 1.4 x 1.3 cm, it was not significantly changed but there was more hypodensity of the internal appearance of the nodule. He is not interested in surgical resection and is seen today to discuss options of treatment of his putative cancer without tissue confirmation.  PREVIOUS RADIATION THERAPY: No    PAST MEDICAL HISTORY:  Past Medical  History:  Diagnosis Date  . Anemia   . Arthritis   . Cataract   . Complication following bilateral mastoidectomy   . Dysphagia    with large pills  . Gout   . HLD (hyperlipidemia)   . HTN (hypertension)   . Lung nodule 02/19/2020  . Melanoma ([place]) 2009   Left forehead  . Right posterior capsular opacification 06/21/2019  . Wrist fracture    left    PAST SURGICAL HISTORY: Past Surgical History:  Procedure Laterality Date  . cataracts    . MASTOIDECTOMY REVISION Bilateral years ago  . ORIF WRIST FRACTURE Left 1975  . QUADRICEPS TENDON REPAIR Right 08/23/2018   Procedure: REPAIR QUADRICEP TENDON;  Surgeon: Gaynelle Arabian, MD;  Location: [facility];  Service: Orthopedics;  Laterality: Right;  58min  . TOTAL HIP ARTHROPLASTY Left 05/05/2016   Procedure: LEFT TOTAL HIP ARTHROPLASTY ANTERIOR APPROACH;  Surgeon: Gaynelle Arabian, MD;  Location: [facility];  Service: Orthopedics;  Laterality: Left;    PAST SOCIAL HISTORY:  Social History   Socioeconomic History  . Marital status: Widowed    Spouse name: Not on file  . Number of children: 2  . Years of education: Not on file  . Highest education level: Not on file  Occupational History  . Occupation: retired  Tobacco Use  . Smoking status: Never Smoker  . Smokeless tobacco: Never Used  Vaping Use  . Vaping Use: Never used  Substance and Sexual Activity  . Alcohol use: No  . Drug use: No  . Sexual activity: Not on file  Other Topics Concern  . Not on file  Social History Narrative  . Not on file   Social Determinants of Health   Financial Resource Strain: Not on file  Food Insecurity: Not on file  Transportation Needs: Not on file  Physical Activity: Not on file  Stress: Not on file  Social Connections: Not on file  Intimate Partner Violence: Not on file    PAST FAMILY HISTORY: Family History  Problem Relation Age of Onset  . Pneumonia Mother   . Heart attack Father   . Breast cancer Sister   . Heart attack Nephew 66   . Heart failure Sister   . Kidney Stones Daughter   . Esophageal cancer Neg Hx   . Colon cancer Neg Hx   . Rectal cancer Neg Hx   . Stomach cancer Neg Hx     MEDICATIONS  Current Outpatient Medications  Medication Sig Dispense Refill  . amLODipine (NORVASC) 2.5 MG tablet Take 2.5 mg by mouth daily.    . Amlodipine Besylate POWD amlodipine besylate (bulk)    . aspirin EC 81 MG tablet Take 81 mg by mouth daily.    . budesonide-formoterol (SYMBICORT) 160-4.5 MCG/ACT inhaler Symbicort    . calcium carbonate (TUMS - DOSED  IN MG ELEMENTAL CALCIUM) 500 MG chewable tablet Chew 1-2 tablets by mouth as needed for indigestion or heartburn.    . Calcium-Vitamin D-Vitamin K (VIACTIV CALCIUM PLUS D) 650-12.5-40 MG-MCG-MCG CHEW Chew 1 each by mouth daily.    . Camphor-Eucalyptus-Menthol (CHEST RUB) 4.8-1.2-2.6 % OINT Vicks Vaporub    . chlorthalidone (HYGROTON) 25 MG tablet Take 12.5 mg by mouth daily.    . Cholecalciferol (VITAMIN D) 50 MCG (2000 UT) tablet Take 2,000 Units by mouth daily.    . Cyanocobalamin (B-12) 2500 MCG TABS Take 2,500 mcg by mouth daily.    . finasteride (PROPECIA) 1 MG tablet finasteride    . finasteride (PROSCAR) 5 MG tablet Take 5 mg by mouth daily.    . Multiple Minerals-Vitamins (BONE ESSENTIALS) CAPS calcium    . NON FORMULARY Take 1 tablet by mouth daily. Vision Vite - 1 tablet daily    . Omega-3 Fatty Acids (RA FISH OIL) 1000 MG CAPS Fish Oil    . polyethylene glycol (MIRALAX / GLYCOLAX) packet Take 17 g by mouth daily as needed for mild constipation.     . rosuvastatin (CRESTOR) 5 MG tablet Take 5 mg by mouth daily.    . Wheat Dextrin (BENEFIBER) POWD Take 10 mLs by mouth daily. 2 tsp Daily     Current Facility-Administered Medications  Medication Dose Route Frequency Provider Last Rate Last Admin  . 0.9 %  sodium chloride infusion  500 mL Intravenous Once Irene Shipper, MD        ALLERGIES:  Allergies  Allergen Reactions  . Other Other (See Comments)    Pt  can not take gel caps - med gets stuck in throat        REVIEW OF SYSTEMS: On review of systems, the patient reports that he is doing well overall. He denies any chest pain, shortness of breath, cough, fevers, chills, night sweats, unintended weight changes. He denies any bowel or bladder disturbances, and denies abdominal pain, nausea or vomiting. He denies any new musculoskeletal or joint aches or pains. A complete review of systems is obtained and is otherwise negative.        PHYSICAL EXAM:       Wt Readings from Last 3 Encounters:    03/06/20   133 lb 9.6 oz (60.6 kg)    12/04/19   128 lb (58.1 kg)    02/14/19   129 lb (58.5 kg)           Temp Readings from Last 3 Encounters:    03/06/20   (!) 96.9 F (36.1 C) (Temporal)    02/08/19   97.8 F (36.6 C)    08/24/18   98.5 F (36.9 C)           BP Readings from Last 3 Encounters:    03/06/20   128/66    02/26/20   133/71    12/04/19   116/60           Pulse Readings from Last 3 Encounters:    03/06/20   71    02/26/20   73    12/04/19   82       Pain Assessment  Pain Score: 0-No pain/10     In general this is a well appearing caucasian male in no acute distress. He's alert and oriented x4 and appropriate throughout the examination. Cardiopulmonary assessment is negative for acute distress and he exhibits normal effort.         ECOG =  0     0 - Asymptomatic (Fully active, able to carry on all predisease activities without restriction)     1 - Symptomatic but completely ambulatory (Restricted in physically strenuous activity but ambulatory and able to carry out work of a light or sedentary nature. For example, light housework, office work)     2 - Symptomatic, <50% in bed during the day (Ambulatory and capable of all self care but unable to carry out any work activities. Up and about more than 50% of waking hours)     3 - Symptomatic,  >50% in bed, but not bedbound (Capable of only limited self-care, confined to bed or chair 50% or more of waking hours)     4 - Bedbound (Completely disabled. Cannot carry on any self-care. Totally confined to bed or chair)     5 - Death      Eustace Pen MM, Creech RH, Tormey DC, et al. [phone]). "Toxicity and response criteria of the [facility]". [place] Oncol. 5 (6): 649-55      IMPRESSION/PLAN: 1.         Putative Stage IA2, cT1bN0M0 NSCLC of the RLL. Dr. Lisbeth Renshaw discusses the nature of early cancer diagnosis and the presumed diagnosis and findings thus far from imaging. Dr. Lisbeth Renshaw discussed the options of continue to follow this finding, versus proceeding with definitve stereotactic body radiotherapy (SBRT) without tissue diagnosis. The patient and his son in law are aware of the limitations of not having tissue confirmation prior to proceeding but are interested in proceeding with radiotherapy. We discussed the risks, benefits, short, and long term effects of radiotherapy, as well as the curative intent. Dr. Lisbeth Renshaw discusses the delivery and logistics of radiotherapy and anticipates a course of 3 to 5 fractions weeks of radiotherapy. Written consent is obtained and placed in the chart, a copy was provided to the patient. He will be contacted for simulation in the near future.     In a visit lasting 60 minutes, greater than 50% of the time was spent face to face with Dr. Lisbeth Renshaw and via Jackquline Denmark with myself discussing the patient's condition, in preparation for the discussion, and coordinating the patient's care.      The above documentation reflects my direct findings during this shared patient visit. Please see the separate note by Dr. Lisbeth Renshaw on this date for the remainder of the patient's plan of care.        Carola Rhine, PAC       Cosigned by: Kyung Rudd, MD at 03/07/2020  6:04 PM

## 2020-04-01 NOTE — Addendum Note (Signed)
Encounter addended by: Hayden Pedro, PA-C on: 04/01/2020 3:22 PM  Actions taken: Clinical Note Signed, Delete clinical note

## 2020-04-02 ENCOUNTER — Encounter: Payer: Self-pay | Admitting: Radiation Oncology

## 2020-04-02 NOTE — Progress Notes (Signed)
Thank you  [facility]

## 2020-04-02 NOTE — Progress Notes (Signed)
  Patient Name: [name] MRN: [id number] DOB: October 14, 1924 Referring Physician: Modesto Charon (Profile Not Attached) Date of Service: 04/01/2020 [facility], [place]                                                        End Of Treatment Note  Diagnoses: C34.31-Malignant neoplasm of lower lobe, right bronchus or lung  Cancer Staging: Putative Stage IA2, cT1bN0M0 NSCLC of the RLL.  Intent: Curative  Radiation Treatment Dates: 03/25/2020 through 04/01/2020 SBRT  Site Technique Total Dose (Gy) Dose per Fx (Gy) Completed Fx Beam Energies  Lung, Right: Lung_Rt IMRT 54/54 18 3/3 6XFFF   Narrative: The patient tolerated radiation therapy relatively well without concerns or complaints.  Plan: The patient will receive a follow up call in about 1 month's time from our department. However we discussed the rationale for repeat CT of the chest with contrast in 6-8 weeks. I've already ordered this and will follow up with the results as well with the patient by phone. He is aware that we would likely then begin scans at 6 month intervals with follow up visits for 5 years and subsequently low dose annual scans thereafter per NCCN guidelines. He is encouraged to call sooner than one month if he has questions or concerns regarding his treatment.  ________________________________________________    Carola Rhine, PAC

## 2020-04-23 NOTE — Progress Notes (Incomplete)
  Radiation Oncology         [phone]) [phone] ________________________________  Name: [name]. MRN: [id number]  Date of Service: 05/05/2020  DOB: 1925-02-26  Post Treatment Telephone Note  Diagnosis:   C34.31-Malignant neoplasm of lower lobe, right bronchus or lung  Interval Since Last Radiation:  3 Days Radiation Treatment Dates: 03/25/2020 through 04/01/2020 SBRT  Site Technique Total Dose (Gy) Dose per Fx (Gy) Completed Fx Beam Energies  Lung, Right: Lung_Rt IMRT          Narrative:  The patient was contacted today for routine follow-up. During treatment he did very well with radiotherapy and did not have significant desquamation. He reports he ***.  Impression/Plan: 1. Malignant neoplasm of lower lobe, right bronchus or lung. The patient has been doing well since completion of radiotherapy. We discussed that we would be happy to continue to follow him as needed.    Loma Messing, LPN

## 2020-04-30 ENCOUNTER — Telehealth: Payer: Self-pay | Admitting: *Deleted

## 2020-04-30 NOTE — Telephone Encounter (Signed)
CALLED PATIENT TO INFORM OF LABS ON 05-12-20 @ 11:30 AM @ [place] AND HIS CT FOR 05-12-20 - ARRIVAL TIME- 12:15 PM, PATIENT TO HAVE WATER ONLY - 4 HRS. PRIOR TO TEST, SPOKE WITH PATIENT'S DAUGHTER - NANCY AND SHE IS AWARE OF THESE APPTS.

## 2020-05-05 ENCOUNTER — Encounter: Payer: Self-pay | Admitting: Radiation Oncology

## 2020-05-05 ENCOUNTER — Other Ambulatory Visit: Payer: Self-pay

## 2020-05-05 ENCOUNTER — Telehealth: Payer: Self-pay

## 2020-05-05 ENCOUNTER — Inpatient Hospital Stay
Admission: RE | Admit: 2020-05-05 | Discharge: 2020-05-05 | Disposition: A | Discharge status: Home / Self Care | Payer: Medicare Other | Source: Ambulatory Visit

## 2020-05-05 DIAGNOSIS — R911 Solitary pulmonary nodule: Secondary | ICD-10-CM

## 2020-05-05 NOTE — Telephone Encounter (Signed)
Spoke with patient in regards to telephone appointment with [name] PA on 05/13/20 @ 8:30am. Advised that this is not an in person visit. Patient verbalized understanding of appointment date and time. Reviewed meaningful use questions. TM

## 2020-05-05 NOTE — Telephone Encounter (Signed)
Left patient voicemail message in regards to telephone appointment with Danise Mina PA on 05/13/20 @ 8:30am. Advised this is not an in person visit. Called to review meaningful use questions. TM

## 2020-05-12 ENCOUNTER — Ambulatory Visit (HOSPITAL_COMMUNITY)
Admission: RE | Admit: 2020-05-12 | Discharge: 2020-05-12 | Disposition: A | Discharge status: Home / Self Care | Payer: Medicare Other | Source: Ambulatory Visit | Attending: Radiation Oncology | Admitting: Radiation Oncology

## 2020-05-12 ENCOUNTER — Other Ambulatory Visit: Payer: Self-pay

## 2020-05-12 ENCOUNTER — Ambulatory Visit
Admission: RE | Admit: 2020-05-12 | Discharge: 2020-05-12 | Disposition: A | Discharge status: Home / Self Care | Payer: Medicare Other | Source: Ambulatory Visit | Attending: Radiation Oncology | Admitting: Radiation Oncology

## 2020-05-12 ENCOUNTER — Encounter (HOSPITAL_COMMUNITY): Payer: Self-pay

## 2020-05-12 ENCOUNTER — Telehealth: Payer: Self-pay | Admitting: Radiation Oncology

## 2020-05-12 DIAGNOSIS — I2510 Atherosclerotic heart disease of native coronary artery without angina pectoris: Secondary | ICD-10-CM | POA: Diagnosis not present

## 2020-05-12 DIAGNOSIS — C3431 Malignant neoplasm of lower lobe, right bronchus or lung: Secondary | ICD-10-CM

## 2020-05-12 DIAGNOSIS — I700 Atherosclerosis of aorta: Secondary | ICD-10-CM | POA: Diagnosis not present

## 2020-05-12 DIAGNOSIS — C3490 Malignant neoplasm of unspecified part of unspecified bronchus or lung: Secondary | ICD-10-CM | POA: Diagnosis not present

## 2020-05-12 DIAGNOSIS — J984 Other disorders of lung: Secondary | ICD-10-CM | POA: Diagnosis not present

## 2020-05-12 LAB — BUN & CREATININE (CHCC)
BUN: 27 mg/dL — ABNORMAL HIGH (ref 8–23)
Creatinine: 1.29 mg/dL — ABNORMAL HIGH (ref 0.61–1.24)
GFR, Estimated: 52 mL/min — ABNORMAL LOW (ref >60)

## 2020-05-12 MED ORDER — IOHEXOL 300 MG/ML  SOLN
75.0000 mL | Freq: Once | INTRAMUSCULAR | Status: AC | PRN
  Administered 2020-05-12: 75 mL via INTRAVENOUS

## 2020-05-12 NOTE — Telephone Encounter (Addendum)
  Radiation Oncology         [phone]) [phone] ________________________________  Name: [name]. MRN: [id number]  Date of Service: 05/12/2020  DOB: [birth year]  Post Treatment Telephone Note  Diagnosis:  Putative Stage IA2, cT1bN0M0 NSCLC of the RLL.  Intent: Curative  Radiation Treatment Dates: 03/25/2020 through 04/01/2020 SBRT  Site Technique Total Dose (Gy) Dose per Fx (Gy) Completed Fx Beam Energies  Lung, Right: Lung_Rt IMRT 54/54 18 3/3 6XFFF     I spoke with the patient and his family (daughter Izora Gala and son in law [place]) and we reviewed his CT scan. I am pleased with the results and we are starting to see post treatment changes in the site of prior carcinoma.  I would recommend we repeat another CT chest in 6 months per NCCN guidelines. He is aware to call if he has questions or concerns prior to this visit.     Carola Rhine, PAC

## 2020-05-13 ENCOUNTER — Ambulatory Visit
Admission: RE | Admit: 2020-05-13 | Discharge: 2020-05-13 | Disposition: A | Discharge status: Home / Self Care | Payer: Medicare Other | Source: Ambulatory Visit | Attending: Radiation Oncology | Admitting: Radiation Oncology

## 2020-05-13 DIAGNOSIS — R911 Solitary pulmonary nodule: Secondary | ICD-10-CM

## 2020-05-19 ENCOUNTER — Ambulatory Visit (INDEPENDENT_AMBULATORY_CARE_PROVIDER_SITE_OTHER): Payer: Medicare Other | Admitting: Ophthalmology

## 2020-05-19 ENCOUNTER — Other Ambulatory Visit: Payer: Self-pay

## 2020-05-19 ENCOUNTER — Encounter (INDEPENDENT_AMBULATORY_CARE_PROVIDER_SITE_OTHER): Payer: Self-pay | Admitting: Ophthalmology

## 2020-05-19 DIAGNOSIS — H35371 Puckering of macula, right eye: Secondary | ICD-10-CM

## 2020-05-19 DIAGNOSIS — H401123 Primary open-angle glaucoma, left eye, severe stage: Secondary | ICD-10-CM | POA: Insufficient documentation

## 2020-05-19 DIAGNOSIS — H353112 Nonexudative age-related macular degeneration, right eye, intermediate dry stage: Secondary | ICD-10-CM

## 2020-05-19 NOTE — Assessment & Plan Note (Signed)
Moderate with no interval change over the years.

## 2020-05-19 NOTE — Progress Notes (Signed)
05/19/2020     CHIEF COMPLAINT Patient presents for Retina Follow Up (7 Month f\u OU. OCT/Pt states vision is stable. Denies new floaters and FOL.)   HISTORY OF PRESENT ILLNESS: [name]. is a 87 y.o. male who presents to the clinic today for:   HPI    Retina Follow Up    Diagnosis: ERM.  In right eye.  Severity is moderate.  Duration of 7 months.  Since onset it is stable.  I, the attending physician,  performed the HPI with the patient and updated documentation appropriately. Additional comments: 7 Month f\u OU. OCT Pt states vision is stable. Denies new floaters and FOL.       Last edited by Tilda Franco on 05/19/2020  1:28 PM. (History)      Referring physician: Crist Infante, MD [place],  [place] [address]  HISTORICAL INFORMATION:   Selected notes from the [facility]: No current outpatient medications on file. (Ophthalmic Drugs)   No current facility-administered medications for this visit. (Ophthalmic Drugs)   Current Outpatient Medications (Other)  Medication Sig  . amLODipine (NORVASC) 2.5 MG tablet Take 2.5 mg by mouth daily.  . Amlodipine Besylate POWD amlodipine besylate (bulk)  . aspirin EC 81 MG tablet Take 81 mg by mouth daily. (Patient not taking: Reported on 05/05/2020)  . budesonide-formoterol (SYMBICORT) 160-4.5 MCG/ACT inhaler Symbicort (Patient not taking: Reported on 05/05/2020)  . calcium carbonate (TUMS - DOSED IN MG ELEMENTAL CALCIUM) 500 MG chewable tablet Chew 1-2 tablets by mouth as needed for indigestion or heartburn.  . Calcium-Vitamin D-Vitamin K (VIACTIV CALCIUM PLUS D) 650-12.5-40 MG-MCG-MCG CHEW Chew 1 each by mouth daily.  . Camphor-Eucalyptus-Menthol (CHEST RUB) 4.8-1.2-2.6 % OINT Vicks Vaporub  . chlorthalidone (HYGROTON) 25 MG tablet Take 12.5 mg by mouth daily.  . Cholecalciferol (VITAMIN D) 50 MCG (2000 UT) tablet Take 2,000 Units by mouth daily.  . Cyanocobalamin (B-12)  2500 MCG TABS Take 2,500 mcg by mouth daily.  . finasteride (PROPECIA) 1 MG tablet finasteride  . finasteride (PROSCAR) 5 MG tablet Take 5 mg by mouth daily.  . Multiple Minerals-Vitamins (BONE ESSENTIALS) CAPS calcium  . NON FORMULARY Take 1 tablet by mouth daily. Vision Vite - 1 tablet daily  . Omega-3 Fatty Acids (RA FISH OIL) 1000 MG CAPS Fish Oil (Patient not taking: Reported on 05/05/2020)  . polyethylene glycol (MIRALAX / GLYCOLAX) packet Take 17 g by mouth daily as needed for mild constipation.   . rosuvastatin (CRESTOR) 5 MG tablet Take 5 mg by mouth daily. (Patient not taking: Reported on 05/05/2020)  . Wheat Dextrin (BENEFIBER) POWD Take 10 mLs by mouth daily. 2 tsp Daily   Current Facility-Administered Medications (Other)  Medication Route  . 0.9 %  sodium chloride infusion Intravenous      REVIEW OF SYSTEMS:    ALLERGIES Allergies  Allergen Reactions  . Other Other (See Comments)    Pt can not take gel caps - med gets stuck in throat     PAST MEDICAL HISTORY Past Medical History:  Diagnosis Date  . Anemia   . Arthritis   . Cataract   . Complication following bilateral mastoidectomy   . Dysphagia    with large pills  . Gout   . HLD (hyperlipidemia)   . HTN (hypertension)   . Lung nodule 02/19/2020  . Melanoma ([place]) 2009   Left forehead  . Right posterior capsular opacification 06/21/2019  .  Wrist fracture    left   Past Surgical History:  Procedure Laterality Date  . cataracts    . MASTOIDECTOMY REVISION Bilateral years ago  . ORIF WRIST FRACTURE Left 1975  . QUADRICEPS TENDON REPAIR Right 08/23/2018   Procedure: REPAIR QUADRICEP TENDON;  Surgeon: Gaynelle Arabian, MD;  Location: [facility];  Service: Orthopedics;  Laterality: Right;  41min  . TOTAL HIP ARTHROPLASTY Left 05/05/2016   Procedure: LEFT TOTAL HIP ARTHROPLASTY ANTERIOR APPROACH;  Surgeon: Gaynelle Arabian, MD;  Location: [facility];  Service: Orthopedics;  Laterality: Left;    FAMILY HISTORY Family  History  Problem Relation Age of Onset  . Pneumonia Mother   . Heart attack Father   . Breast cancer Sister   . Heart attack Nephew 66  . Heart failure Sister   . Kidney Stones Daughter   . Esophageal cancer Neg Hx   . Colon cancer Neg Hx   . Rectal cancer Neg Hx   . Stomach cancer Neg Hx     SOCIAL HISTORY Social History   Tobacco Use  . Smoking status: Never Smoker  . Smokeless tobacco: Never Used  Vaping Use  . Vaping Use: Never used  Substance Use Topics  . Alcohol use: No  . Drug use: No         OPHTHALMIC EXAM: Base Eye Exam    Visual Acuity (Snellen - Linear)      Right Left   Dist cc 20/70 +1 CF @ 5'   Dist ph cc NI NI   Correction: Glasses       Tonometry (Tonopen, 1:32 PM)      Right Left   Pressure 11 12       Pupils      Pupils Dark Light Shape React APD   Right PERRL 3 2 Round Brisk None   Left PERRL 3 2 Round Brisk None       Visual Fields (Counting fingers)      Left Right    Full Full       Neuro/Psych    Oriented x3: Yes   Mood/Affect: Normal       Dilation    Both eyes: 1.0% Mydriacyl, 2.5% Phenylephrine @ 1:32 PM        Slit Lamp and Fundus Exam    External Exam      Right Left   External Normal Normal       Slit Lamp Exam      Right Left   Lids/Lashes Normal Normal   Conjunctiva/Sclera White and quiet White and quiet   Cornea Clear Clear   Anterior Chamber Deep and quiet Deep and quiet   Iris Round and reactive Round and reactive   Lens Posterior chamber intraocular lens, 2+ Posterior capsular opacification Posterior chamber intraocular lens, Open posterior capsule   Anterior Vitreous Normal Normal       Fundus Exam      Right Left   Posterior Vitreous Normal Normal   Disc Normal 1+ Pallor, Thin rim inferiorly   C/D Ratio 0.45 0.85   Macula Epiretinal membrane moderate topographic distortion, Hard drusen, Early age related macular degeneration Hard drusen, no macular thickening, no hemorrhage   Vessels Normal  Normal   Periphery Normal Normal          IMAGING AND PROCEDURES  Imaging and Procedures for 05/19/20  OCT, Retina - OU - Both Eyes       Right Eye Central Foveal Thickness: 341. Progression has been stable.  Findings include epiretinal membrane.   Left Eye Quality was good. Scan locations included subfoveal. Central Foveal Thickness: 395. Findings include lamellar hole.   Notes OS, with history of prior severe epiretinal membrane removed 2012, now with small lamellar type macular hole yet outer retinal tissue is in place OS  OD with moderate epiretinal membrane with no interval change over the last 9 years                ASSESSMENT/PLAN:  Right epiretinal membrane Moderate with no interval change over the years.  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  Intermediate stage nonexudative age-related macular degeneration of right eye No signs of CNVM and no impact on acuity from ARMD      ICD-10-CM   1. Right epiretinal membrane  H35.371 OCT, Retina - OU - Both Eyes  2. Primary open angle glaucoma of left eye, severe stage  H40.1123   3. Intermediate stage nonexudative age-related macular degeneration of right eye  H35.3112     1.  Minor epiretinal membrane in the right eye with not substantial change over the last 8 to 9 years will observe with intermediate ARMD also present no CNVM  2.  The left eye has vastly improved macular findings post severe epiretinal membrane surgical removal 2012 yet daily has remained stable if not slightly worse which appears to be on the basis of optic nerve damage  3.  Optic nerve cupping continues to appear present on my examination to the left eye today.  Patient has been under the care of Dr. Tenna Child in recent times and [place] [facility].  I will suggest follow-up with Dr. Quay Burow and consideration of glaucoma therapy institution left eye and  possibly right eye with potential for low-tension glaucoma    Ophthalmic Meds Ordered this visit:  No orders of the defined types were placed in this encounter.      Return in about 6 months (around 11/19/2020) for DILATE OU, OCT.  There are no Patient Instructions on file for this visit.   Explained the diagnoses, plan, and follow up with the patient and they expressed understanding.  Patient expressed understanding of the importance of proper follow up care.   Clent Demark Rankin M.D. Diseases & Surgery of the Retina and Vitreous Retina & Diabetic [place] 05/19/20     Abbreviations: M myopia (nearsighted); A astigmatism; H hyperopia (farsighted); P presbyopia; Mrx spectacle prescription;  CTL contact lenses; OD right eye; OS left eye; OU both eyes  XT exotropia; ET esotropia; PEK punctate epithelial keratitis; PEE punctate epithelial erosions; DES dry eye syndrome; MGD meibomian gland dysfunction; ATs artificial tears; PFAT's preservative free artificial tears; [place] nuclear sclerotic cataract; PSC posterior subcapsular cataract; ERM epi-retinal membrane; PVD posterior vitreous detachment; RD retinal detachment; DM diabetes mellitus; DR diabetic retinopathy; NPDR non-proliferative diabetic retinopathy; PDR proliferative diabetic retinopathy; CSME clinically significant macular edema; DME diabetic macular edema; dbh dot blot hemorrhages; CWS cotton wool spot; POAG primary open angle glaucoma; C/D cup-to-disc ratio; HVF humphrey visual field; GVF goldmann visual field; OCT optical coherence tomography; IOP intraocular pressure; BRVO Branch retinal vein occlusion; CRVO central retinal vein occlusion; CRAO central retinal artery occlusion; BRAO branch retinal artery occlusion; RT retinal tear; SB scleral buckle; PPV pars plana vitrectomy; VH Vitreous hemorrhage; PRP panretinal laser photocoagulation; IVK intravitreal kenalog; VMT vitreomacular traction; MH Macular hole;  NVD neovascularization of  the disc; NVE neovascularization  elsewhere; AREDS age related eye disease study; ARMD age related macular degeneration; POAG primary open angle glaucoma; EBMD epithelial/anterior basement membrane dystrophy; ACIOL anterior chamber intraocular lens; IOL intraocular lens; PCIOL posterior chamber intraocular lens; Phaco/IOL phacoemulsification with intraocular lens placement; [place] photorefractive keratectomy; LASIK laser assisted in situ keratomileusis; HTN hypertension; DM diabetes mellitus; COPD chronic obstructive pulmonary disease

## 2020-05-19 NOTE — Assessment & Plan Note (Signed)
No signs of CNVM and no impact on acuity from ARMD

## 2020-05-19 NOTE — Assessment & Plan Note (Signed)
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

## 2020-06-06 ENCOUNTER — Encounter (INDEPENDENT_AMBULATORY_CARE_PROVIDER_SITE_OTHER): Payer: Self-pay

## 2020-06-06 DIAGNOSIS — H40013 Open angle with borderline findings, low risk, bilateral: Secondary | ICD-10-CM | POA: Diagnosis not present

## 2020-06-18 DIAGNOSIS — Z23 Encounter for immunization: Secondary | ICD-10-CM | POA: Diagnosis not present

## 2020-07-07 DIAGNOSIS — I10 Essential (primary) hypertension: Secondary | ICD-10-CM | POA: Diagnosis not present

## 2020-07-07 DIAGNOSIS — N1831 Chronic kidney disease, stage 3a: Secondary | ICD-10-CM | POA: Diagnosis not present

## 2020-07-07 DIAGNOSIS — I129 Hypertensive chronic kidney disease with stage 1 through stage 4 chronic kidney disease, or unspecified chronic kidney disease: Secondary | ICD-10-CM | POA: Diagnosis not present

## 2020-07-07 DIAGNOSIS — D472 Monoclonal gammopathy: Secondary | ICD-10-CM | POA: Diagnosis not present

## 2020-07-07 DIAGNOSIS — M8580 Other specified disorders of bone density and structure, unspecified site: Secondary | ICD-10-CM | POA: Diagnosis not present

## 2020-07-07 DIAGNOSIS — M5416 Radiculopathy, lumbar region: Secondary | ICD-10-CM | POA: Diagnosis not present

## 2020-07-07 DIAGNOSIS — R911 Solitary pulmonary nodule: Secondary | ICD-10-CM | POA: Diagnosis not present

## 2020-07-07 DIAGNOSIS — J45909 Unspecified asthma, uncomplicated: Secondary | ICD-10-CM | POA: Diagnosis not present

## 2020-07-07 DIAGNOSIS — R946 Abnormal results of thyroid function studies: Secondary | ICD-10-CM | POA: Diagnosis not present

## 2020-07-07 DIAGNOSIS — N401 Enlarged prostate with lower urinary tract symptoms: Secondary | ICD-10-CM | POA: Diagnosis not present

## 2020-07-07 DIAGNOSIS — R7301 Impaired fasting glucose: Secondary | ICD-10-CM | POA: Diagnosis not present

## 2020-07-07 DIAGNOSIS — E785 Hyperlipidemia, unspecified: Secondary | ICD-10-CM | POA: Diagnosis not present

## 2020-07-14 DIAGNOSIS — N400 Enlarged prostate without lower urinary tract symptoms: Secondary | ICD-10-CM | POA: Diagnosis not present

## 2020-10-29 ENCOUNTER — Telehealth: Payer: Self-pay | Admitting: *Deleted

## 2020-10-29 DIAGNOSIS — I10 Essential (primary) hypertension: Secondary | ICD-10-CM | POA: Diagnosis not present

## 2020-10-29 DIAGNOSIS — E785 Hyperlipidemia, unspecified: Secondary | ICD-10-CM | POA: Diagnosis not present

## 2020-10-29 DIAGNOSIS — N182 Chronic kidney disease, stage 2 (mild): Secondary | ICD-10-CM | POA: Diagnosis not present

## 2020-10-29 NOTE — Telephone Encounter (Signed)
CALLED PATIENT TO INFORM OF LABS ON 11-12-20 @ 11 AM @ [place] [facility] TO FOLLOW- ARRIVAL TIME - 11:45 AM @ [facility], PATIENT TO HAVE WATER ONLY- 4 HRS. PRIOR TO TEST, PATIENT TO RECEIVE RESULTS FROM ALISON PERKINS ON 11-24-20 @ 2 PM, VIA TELEPHONE, LVM FOR A RETURN CALL

## 2020-11-12 ENCOUNTER — Ambulatory Visit (HOSPITAL_COMMUNITY)
Admission: RE | Admit: 2020-11-12 | Discharge: 2020-11-12 | Disposition: A | Discharge status: Home / Self Care | Payer: Medicare Other | Source: Ambulatory Visit | Attending: Radiation Oncology | Admitting: Radiation Oncology

## 2020-11-12 ENCOUNTER — Ambulatory Visit
Admission: RE | Admit: 2020-11-12 | Discharge: 2020-11-12 | Disposition: A | Discharge status: Home / Self Care | Payer: Medicare Other | Source: Ambulatory Visit | Attending: Radiation Oncology | Admitting: Radiation Oncology

## 2020-11-12 ENCOUNTER — Other Ambulatory Visit: Payer: Self-pay

## 2020-11-12 DIAGNOSIS — R911 Solitary pulmonary nodule: Secondary | ICD-10-CM | POA: Diagnosis not present

## 2020-11-12 DIAGNOSIS — C3490 Malignant neoplasm of unspecified part of unspecified bronchus or lung: Secondary | ICD-10-CM | POA: Diagnosis not present

## 2020-11-12 DIAGNOSIS — C3431 Malignant neoplasm of lower lobe, right bronchus or lung: Secondary | ICD-10-CM | POA: Diagnosis not present

## 2020-11-12 DIAGNOSIS — J439 Emphysema, unspecified: Secondary | ICD-10-CM | POA: Diagnosis not present

## 2020-11-12 DIAGNOSIS — I700 Atherosclerosis of aorta: Secondary | ICD-10-CM | POA: Diagnosis not present

## 2020-11-12 LAB — BUN & CREATININE (CHCC)
BUN: 24 mg/dL — ABNORMAL HIGH (ref 8–23)
Creatinine: 1.24 mg/dL (ref 0.61–1.24)
GFR, Estimated: 54 mL/min — ABNORMAL LOW (ref >60)

## 2020-11-12 MED ORDER — IOHEXOL 350 MG/ML SOLN
60.0000 mL | Freq: Once | INTRAVENOUS | Status: AC | PRN
  Administered 2020-11-12: 60 mL via INTRAVENOUS

## 2020-11-13 ENCOUNTER — Encounter: Payer: Self-pay | Admitting: Radiation Oncology

## 2020-11-13 NOTE — Progress Notes (Signed)
Patient states doing well and no symptoms to report at this time.  Meaningful use complete.  Patient notified of 3:30pm-11/17/20 and understands.

## 2020-11-17 ENCOUNTER — Ambulatory Visit
Admission: RE | Admit: 2020-11-17 | Discharge: 2020-11-17 | Disposition: A | Discharge status: Home / Self Care | Payer: Medicare Other | Source: Ambulatory Visit | Attending: Radiation Oncology | Admitting: Radiation Oncology

## 2020-11-17 DIAGNOSIS — C3431 Malignant neoplasm of lower lobe, right bronchus or lung: Secondary | ICD-10-CM | POA: Diagnosis not present

## 2020-11-17 DIAGNOSIS — Z08 Encounter for follow-up examination after completed treatment for malignant neoplasm: Secondary | ICD-10-CM | POA: Diagnosis not present

## 2020-11-17 NOTE — Progress Notes (Signed)
Radiation Oncology         [phone] ________________________________  Outpatient Visit - Conducted via telephone due to current COVID-19 concerns for limiting patient exposure  I spoke with the patient to conduct this consult visit via telephone to spare the patient unnecessary potential exposure in the healthcare setting during the current COVID-19 pandemic. The patient was notified in advance and was offered a [place] meeting to allow for face to face communication but unfortunately reported that they did not have the appropriate resources/technology to support such a visit and instead preferred to proceed with a telephone visit. ________________________________    Name: [name].        MRN: [id number]   Date of Service:11/17/20  DOB: 1925-01-27  ZY:[id number], [name], MD  [name], *     REFERRING PHYSICIAN: [name], *  DIAGNOSIS: The primary encounter diagnosis was Nodule of lower lobe of right lung. A diagnosis of Malignant neoplasm of bronchus of right lower lobe [facility]) was also pertinent to this visit.  HISTORY OF PRESENT ILLNESS: [name]. is a 87 y.o.  male seen at the request of Dr. Roxan Hockey for a putative stage I lung cancer of the right lower lobe.  The patient has a longstanding history of smoking,and is a lifelong smoker.  He had a asthma attack several months ago, which prompted a chest x-ray at [facility] that revealed a nodule in the lung.  He proceeded with CT imaging on 10/23/2019 which revealed a 1.1 x 1.4 x 2.8 cm nodule that was spiculated in the right lower lobe, a PET scan on 11/23/2019 revealed an SUV of 3.2 and the nodule in the right lower lobe measured 1.6 x 1.4 cm, blood pool activity was 2.5.  There was also focal hypermetabolic activity in the right infrahilar region with an SUV max of 4.2 however there was no CT correlate for this.  He was counseled on options of proceeding with biopsy but given his age and  comorbidities declined, he did have a repeat CT scan with super D component on 02/19/2020 which again demonstrates the lobulated pulmonary nodule in the right lower lobe which abuts the major fissure measuring 1.4 x 1.3 cm, it was not significantly changed but there was more hypodensity of the internal appearance of the nodule. He was not interested in surgical resection and rather proceeded with Stereotactic Body Radiotherapy (SBRT). He completed this in February 2022.   Since his treatment he's done well and his most recent CT scan on 11/12/20 showed stable changes in the RLL consistent with prior radiation change. No new disease was noted and stable emphysematous and atherosclerotic changes were noted.   PREVIOUS RADIATION THERAPY:   03/25/2020 through 04/01/2020 SBRT   Site Technique Total Dose (Gy) Dose per Fx (Gy) Completed Fx Beam Energies  Lung, Right: Lung_Rt IMRT 54/54 18 3/3 6XFFF      PAST MEDICAL HISTORY:  Past Medical History:  Diagnosis Date   Anemia    Arthritis    Cataract    Complication following bilateral mastoidectomy    Dysphagia    with large pills   Gout    HLD (hyperlipidemia)    HTN (hypertension)    Lung nodule 02/19/2020   Melanoma ([place]) 2009   Left forehead   Right posterior capsular opacification 06/21/2019   Wrist fracture    left    PAST SURGICAL HISTORY: Past Surgical History:  Procedure Laterality Date   cataracts  MASTOIDECTOMY REVISION Bilateral years ago   ORIF WRIST FRACTURE Left 1975   QUADRICEPS TENDON REPAIR Right 08/23/2018   Procedure: REPAIR QUADRICEP TENDON;  Surgeon: Gaynelle Arabian, MD;  Location: [facility];  Service: Orthopedics;  Laterality: Right;  62min   TOTAL HIP ARTHROPLASTY Left 05/05/2016   Procedure: LEFT TOTAL HIP ARTHROPLASTY ANTERIOR APPROACH;  Surgeon: Gaynelle Arabian, MD;  Location: [facility];  Service: Orthopedics;  Laterality: Left;    PAST SOCIAL HISTORY:  Social History   Socioeconomic History   Marital status:  Widowed    Spouse name: Not on file   Number of children: 2   Years of education: Not on file   Highest education level: Not on file  Occupational History   Occupation: retired  Tobacco Use   Smoking status: Never   Smokeless tobacco: Never  Vaping Use   Vaping Use: Never used  Substance and Sexual Activity   Alcohol use: No   Drug use: No   Sexual activity: Not on file  Other Topics Concern   Not on file  Social History Narrative   Not on file   Social Determinants of Health   Financial Resource Strain: Not on file  Food Insecurity: Not on file  Transportation Needs: Not on file  Physical Activity: Not on file  Stress: Not on file  Social Connections: Not on file  Intimate Partner Violence: Not on file    PAST FAMILY HISTORY: Family History  Problem Relation Age of Onset   Pneumonia Mother    Heart attack Father    Breast cancer Sister    Heart attack Nephew 66   Heart failure Sister    Kidney Stones Daughter    Esophageal cancer Neg Hx    Colon cancer Neg Hx    Rectal cancer Neg Hx    Stomach cancer Neg Hx     MEDICATIONS  Current Outpatient Medications  Medication Sig Dispense Refill   amLODipine (NORVASC) 2.5 MG tablet Take 2.5 mg by mouth daily.     Amlodipine Besylate POWD amlodipine besylate (bulk)     calcium carbonate (TUMS - DOSED IN MG ELEMENTAL CALCIUM) 500 MG chewable tablet Chew 1-2 tablets by mouth as needed for indigestion or heartburn.     Calcium-Vitamin D-Vitamin K (VIACTIV CALCIUM PLUS D) 650-12.5-40 MG-MCG-MCG CHEW Chew 1 each by mouth daily.     Camphor-Eucalyptus-Menthol (CHEST RUB) 4.8-1.2-2.6 % OINT Vicks Vaporub     chlorthalidone (HYGROTON) 25 MG tablet Take 12.5 mg by mouth daily.     Cholecalciferol (VITAMIN D) 50 MCG (2000 UT) tablet Take 2,000 Units by mouth daily.     Cyanocobalamin (B-12) 2500 MCG TABS Take 2,500 mcg by mouth daily.     finasteride (PROPECIA) 1 MG tablet finasteride     finasteride (PROSCAR) 5 MG tablet Take  5 mg by mouth daily.     Multiple Minerals-Vitamins (BONE ESSENTIALS) CAPS calcium     NON FORMULARY Take 1 tablet by mouth daily. Vision Vite - 1 tablet daily     polyethylene glycol (MIRALAX / GLYCOLAX) packet Take 17 g by mouth daily as needed for mild constipation.      Wheat Dextrin (BENEFIBER) POWD Take 10 mLs by mouth daily. 2 tsp Daily     aspirin EC 81 MG tablet Take 81 mg by mouth daily. (Patient not taking: No sig reported)     budesonide-formoterol (SYMBICORT) 160-4.5 MCG/ACT inhaler Symbicort (Patient not taking: No sig reported)     Omega-3 Fatty Acids (RA  FISH OIL) 1000 MG CAPS Fish Oil (Patient not taking: No sig reported)     rosuvastatin (CRESTOR) 5 MG tablet Take 5 mg by mouth daily. (Patient not taking: No sig reported)     Current Facility-Administered Medications  Medication Dose Route Frequency Provider Last Rate Last Admin   0.9 %  sodium chloride infusion  500 mL Intravenous Once Irene Shipper, MD        ALLERGIES:  Allergies  Allergen Reactions   Other Other (See Comments)    Pt can not take gel caps - med gets stuck in throat        REVIEW OF SYSTEMS: On review of systems, the patient reports that he is doing well overall. He denies any chest pain shortness of breath fevers or chills.  He has not had any trouble with pain that is new, he describes feeling great overall.  He desires to continue to be aggressive in his follow-up.  No other complaints are verbalized.        PHYSICAL EXAM:  Unable to assess due to encounter type.    ECOG = 0 0 - Asymptomatic (Fully active, able to carry on all predisease activities without restriction) 1 - Symptomatic but completely ambulatory (Restricted in physically strenuous activity but ambulatory and able to carry out work of a light or sedentary nature. For example, light housework, office work) 2 - Symptomatic, <50% in bed during the day (Ambulatory and capable of all self care but unable to carry out any work  activities. Up and about more than 50% of waking hours) 3 - Symptomatic, >50% in bed, but not bedbound (Capable of only limited self-care, confined to bed or chair 50% or more of waking hours) 4 - Bedbound (Completely disabled. Cannot carry on any self-care. Totally confined to bed or chair) 5 - Death   Eustace Pen MM, Creech RH, Tormey DC, et al. [phone]). "Toxicity and response criteria of the [facility]". [place] Oncol. 5 (6): 649-55      IMPRESSION/PLAN: 1.         Putative Stage IA2, cT1bN0M0 NSCLC of the RLL. The patient is clinically doing well and radiographically is without disease. We discussed the rationale to proceed with repeat CT scan in 6 months time.  He is aware that his  performance status does drive the role for surveillance.  Currently his performance status is so good I think it is reasonable to continue with his  desires of aggressive surveillance. He is in agreement with this plan and will let us know if he has questions or concerns prior to his next scan.     Given current concerns for patient exposure during the COVID-19 pandemic, this encounter was conducted via telephone.  The patient has provided two factor identification and has given verbal consent for this type of encounter and has been advised to only accept a meeting of this type in a secure network environment. The time spent during this encounter was 30 minutes including preparation, discussion, and coordination of the patient's care. The attendants for this meeting include Hayden Pedro  and [name]..  During the encounter, Hayden Pedro was located at [facility] Radiation Oncology Department.  [name]. was located at home.          Carola Rhine, PAC       Cosigned by: Kyung Rudd, MD at 03/07/2020  6:04 PM

## 2020-11-20 ENCOUNTER — Ambulatory Visit (INDEPENDENT_AMBULATORY_CARE_PROVIDER_SITE_OTHER): Payer: Medicare Other | Admitting: Ophthalmology

## 2020-11-20 ENCOUNTER — Encounter (INDEPENDENT_AMBULATORY_CARE_PROVIDER_SITE_OTHER): Payer: Medicare Other | Admitting: Ophthalmology

## 2020-11-20 ENCOUNTER — Other Ambulatory Visit: Payer: Self-pay

## 2020-11-20 ENCOUNTER — Encounter (INDEPENDENT_AMBULATORY_CARE_PROVIDER_SITE_OTHER): Payer: Self-pay | Admitting: Ophthalmology

## 2020-11-20 DIAGNOSIS — H353112 Nonexudative age-related macular degeneration, right eye, intermediate dry stage: Secondary | ICD-10-CM

## 2020-11-20 DIAGNOSIS — H353122 Nonexudative age-related macular degeneration, left eye, intermediate dry stage: Secondary | ICD-10-CM

## 2020-11-20 DIAGNOSIS — H401123 Primary open-angle glaucoma, left eye, severe stage: Secondary | ICD-10-CM

## 2020-11-20 DIAGNOSIS — H35371 Puckering of macula, right eye: Secondary | ICD-10-CM | POA: Diagnosis not present

## 2020-11-20 NOTE — Progress Notes (Signed)
11/20/2020     CHIEF COMPLAINT Patient presents for  Chief Complaint  Patient presents with   Retina Follow Up      HISTORY OF PRESENT ILLNESS: [name]. is a 87 y.o. male who presents to the clinic today for:   HPI     Retina Follow Up   Patient presents with  Other.  In both eyes.  This started 6 months ago.  Severity is mild.  Duration of 6 months.  Since onset it is stable.        Comments   6 month fu ou and oct Pt states VA OU stable since last visit. Pt denies FOL, floaters, or ocular pain OU.        Last edited by Kendra Opitz, COA on 11/20/2020  1:07 PM.      Referring physician: [place], Heather, [place] [address] [place],  [place] [address]  HISTORICAL INFORMATION:   Selected notes from the [facility]: No current outpatient medications on file. (Ophthalmic Drugs)   No current facility-administered medications for this visit. (Ophthalmic Drugs)   Current Outpatient Medications (Other)  Medication Sig   amLODipine (NORVASC) 2.5 MG tablet Take 2.5 mg by mouth daily.   Amlodipine Besylate POWD amlodipine besylate (bulk)   aspirin EC 81 MG tablet Take 81 mg by mouth daily. (Patient not taking: No sig reported)   budesonide-formoterol (SYMBICORT) 160-4.5 MCG/ACT inhaler Symbicort (Patient not taking: No sig reported)   calcium carbonate (TUMS - DOSED IN MG ELEMENTAL CALCIUM) 500 MG chewable tablet Chew 1-2 tablets by mouth as needed for indigestion or heartburn.   Calcium-Vitamin D-Vitamin K (VIACTIV CALCIUM PLUS D) 650-12.5-40 MG-MCG-MCG CHEW Chew 1 each by mouth daily.   Camphor-Eucalyptus-Menthol (CHEST RUB) 4.8-1.2-2.6 % OINT Vicks Vaporub   chlorthalidone (HYGROTON) 25 MG tablet Take 12.5 mg by mouth daily.   Cholecalciferol (VITAMIN D) 50 MCG (2000 UT) tablet Take 2,000 Units by mouth daily.   Cyanocobalamin (B-12) 2500 MCG TABS Take 2,500 mcg by mouth daily.   finasteride (PROPECIA) 1 MG tablet  finasteride   finasteride (PROSCAR) 5 MG tablet Take 5 mg by mouth daily.   Multiple Minerals-Vitamins (BONE ESSENTIALS) CAPS calcium   NON FORMULARY Take 1 tablet by mouth daily. Vision Vite - 1 tablet daily   Omega-3 Fatty Acids (RA FISH OIL) 1000 MG CAPS Fish Oil (Patient not taking: No sig reported)   polyethylene glycol (MIRALAX / GLYCOLAX) packet Take 17 g by mouth daily as needed for mild constipation.    rosuvastatin (CRESTOR) 5 MG tablet Take 5 mg by mouth daily. (Patient not taking: No sig reported)   Wheat Dextrin (BENEFIBER) POWD Take 10 mLs by mouth daily. 2 tsp Daily   Current Facility-Administered Medications (Other)  Medication Route   0.9 %  sodium chloride infusion Intravenous      REVIEW OF SYSTEMS:    ALLERGIES Allergies  Allergen Reactions   Other Other (See Comments)    Pt can not take gel caps - med gets stuck in throat     PAST MEDICAL HISTORY Past Medical History:  Diagnosis Date   Anemia    Arthritis    Cataract    Complication following bilateral mastoidectomy    Dysphagia    with large pills   Gout    HLD (hyperlipidemia)    HTN (hypertension)    Lung nodule 02/19/2020   Melanoma ([place]) 2009   Left forehead  Right posterior capsular opacification 06/21/2019   Wrist fracture    left   Past Surgical History:  Procedure Laterality Date   cataracts     MASTOIDECTOMY REVISION Bilateral years ago   ORIF WRIST FRACTURE Left 1975   QUADRICEPS TENDON REPAIR Right 08/23/2018   Procedure: REPAIR QUADRICEP TENDON;  Surgeon: Gaynelle Arabian, MD;  Location: [facility];  Service: Orthopedics;  Laterality: Right;  44min   TOTAL HIP ARTHROPLASTY Left 05/05/2016   Procedure: LEFT TOTAL HIP ARTHROPLASTY ANTERIOR APPROACH;  Surgeon: Gaynelle Arabian, MD;  Location: [facility];  Service: Orthopedics;  Laterality: Left;    FAMILY HISTORY Family History  Problem Relation Age of Onset   Pneumonia Mother    Heart attack Father    Breast cancer Sister    Heart attack  Nephew 66   Heart failure Sister    Kidney Stones Daughter    Esophageal cancer Neg Hx    Colon cancer Neg Hx    Rectal cancer Neg Hx    Stomach cancer Neg Hx     SOCIAL HISTORY Social History   Tobacco Use   Smoking status: Never   Smokeless tobacco: Never  Vaping Use   Vaping Use: Never used  Substance Use Topics   Alcohol use: No   Drug use: No         OPHTHALMIC EXAM:  Base Eye Exam     Visual Acuity (ETDRS)       Right Left   Dist cc 20/80 20/400 ecc   Dist ph cc 20/70 -1 NI    Correction: Glasses         Tonometry (Tonopen, 1:11 PM)       Right Left   Pressure 11 11         Pupils       Pupils Dark Light Shape React APD   Right PERRL 3 3 Round Minimal None   Left PERRL 3 3 Round Minimal None         Visual Fields       Left Right    Full Full         Extraocular Movement       Right Left    Full Full         Neuro/Psych     Oriented x3: Yes   Mood/Affect: Normal         Dilation     Both eyes: 1.0% Mydriacyl, 2.5% Phenylephrine @ 1:11 PM           Slit Lamp and Fundus Exam     External Exam       Right Left   External Normal Normal         Slit Lamp Exam       Right Left   Lids/Lashes Normal Normal   Conjunctiva/Sclera White and quiet White and quiet   Cornea Clear Clear   Anterior Chamber Deep and quiet Deep and quiet   Iris Round and reactive Round and reactive   Lens Posterior chamber intraocular lens, 2+ Posterior capsular opacification Posterior chamber intraocular lens, Open posterior capsule   Anterior Vitreous Normal Normal         Fundus Exam       Right Left   Posterior Vitreous Posterior vitreous detachment Normal   Disc Normal 1+ Pallor, Thin rim inferiorly   C/D Ratio 0.55 0.85   Macula Epiretinal membrane moderate topographic distortion, Hard drusen, Early age related macular degeneration Hard drusen, no macular thickening,  no hemorrhage   Vessels Normal Normal   Periphery  Normal Normal            IMAGING AND PROCEDURES  Imaging and Procedures for 11/20/20  OCT, Retina - OU - Both Eyes       Right Eye Quality was good. Scan locations included subfoveal. Central Foveal Thickness: 341. Progression has been stable. Findings include epiretinal membrane.   Left Eye Quality was good. Scan locations included subfoveal. Central Foveal Thickness: 395. Findings include lamellar hole.   Notes OS, with history of prior severe epiretinal membrane removed 2012, now with small lamellar type macular hole yet outer retinal tissue is in place OS, vision limited OS by advanced OAG and cupping  OD with moderate epiretinal membrane with no interval change over the last 9 years             ASSESSMENT/PLAN:  Right epiretinal membrane Definite impact upon vision OD yet no interval change over the last several years patient wants to observe  Primary open angle glaucoma of left eye, severe stage Advanced cupping OS accounts for acuity  Intermediate stage nonexudative age-related macular degeneration of right eye The nature of age--related macular degeneration was discussed with the patient as well as the distinction between dry and wet types. Checking an Amsler Grid daily with advice to return immediately should a distortion develop, was given to the patient. The patient 's smoking status now and in the past was determined and advice based on the AREDS study was provided regarding the consumption of antioxidant supplements. AREDS 2 vitamin formulation was recommended. Consumption of dark leafy vegetables and fresh fruits of various colors was recommended. Treatment modalities for wet macular degeneration particularly the use of intravitreal injections of anti-blood vessel growth factors was discussed with the patient. Avastin, Lucentis, and Eylea are the available options. On occasion, therapy includes the use of photodynamic therapy and thermal laser. Stressed to the  patient do not rub eyes.  Patient was advised to check Amsler Grid daily and return immediately if changes are noted. Instructions on using the grid were given to the patient. All patient questions were answered.     ICD-10-CM   1. Right epiretinal membrane  H35.371 OCT, Retina - OU - Both Eyes    2. Intermediate stage nonexudative age-related macular degeneration of right eye  H35.3112 OCT, Retina - OU - Both Eyes    3. Intermediate stage nonexudative age-related macular degeneration of left eye  H35.3122 OCT, Retina - OU - Both Eyes    4. Primary open angle glaucoma of left eye, severe stage  H40.1123       1.  Patient's been instructed to notify us promptly of new onset visual acuity declines or distortions in either eye. 2.  OD functions well with no interval change in the degree of thickening from epiretinal membrane we will continue to observe per patient's chosen desire  3.  Ophthalmic Meds Ordered this visit:  No orders of the defined types were placed in this encounter.      Return in about 9 months (around 08/20/2021) for DILATE OU, OCT.  There are no Patient Instructions on file for this visit.   Explained the diagnoses, plan, and follow up with the patient and they expressed understanding.  Patient expressed understanding of the importance of proper follow up care.   Clent Demark Renso Swett M.D. Diseases & Surgery of the Retina and Vitreous [facility] 11/20/20     Abbreviations: M myopia (  nearsighted); A astigmatism; H hyperopia (farsighted); P presbyopia; Mrx spectacle prescription;  CTL contact lenses; OD right eye; OS left eye; OU both eyes  XT exotropia; ET esotropia; PEK punctate epithelial keratitis; PEE punctate epithelial erosions; DES dry eye syndrome; MGD meibomian gland dysfunction; ATs artificial tears; PFAT's preservative free artificial tears; [place] nuclear sclerotic cataract; PSC posterior subcapsular cataract; ERM epi-retinal membrane; PVD  posterior vitreous detachment; RD retinal detachment; DM diabetes mellitus; DR diabetic retinopathy; NPDR non-proliferative diabetic retinopathy; PDR proliferative diabetic retinopathy; CSME clinically significant macular edema; DME diabetic macular edema; dbh dot blot hemorrhages; CWS cotton wool spot; POAG primary open angle glaucoma; C/D cup-to-disc ratio; HVF humphrey visual field; GVF goldmann visual field; OCT optical coherence tomography; IOP intraocular pressure; BRVO Branch retinal vein occlusion; CRVO central retinal vein occlusion; CRAO central retinal artery occlusion; BRAO branch retinal artery occlusion; RT retinal tear; SB scleral buckle; PPV pars plana vitrectomy; VH Vitreous hemorrhage; PRP panretinal laser photocoagulation; IVK intravitreal kenalog; VMT vitreomacular traction; MH Macular hole;  NVD neovascularization of the disc; NVE neovascularization elsewhere; AREDS age related eye disease study; ARMD age related macular degeneration; POAG primary open angle glaucoma; EBMD epithelial/anterior basement membrane dystrophy; ACIOL anterior chamber intraocular lens; IOL intraocular lens; PCIOL posterior chamber intraocular lens; Phaco/IOL phacoemulsification with intraocular lens placement; [place] photorefractive keratectomy; LASIK laser assisted in situ keratomileusis; HTN hypertension; DM diabetes mellitus; COPD chronic obstructive pulmonary disease

## 2020-11-20 NOTE — Assessment & Plan Note (Signed)

## 2020-11-20 NOTE — Assessment & Plan Note (Signed)
Definite impact upon vision OD yet no interval change over the last several years patient wants to observe

## 2020-11-20 NOTE — Assessment & Plan Note (Signed)
Advanced cupping OS accounts for acuity

## 2020-11-24 ENCOUNTER — Ambulatory Visit: Payer: Self-pay | Admitting: Radiation Oncology

## 2020-11-28 DIAGNOSIS — E785 Hyperlipidemia, unspecified: Secondary | ICD-10-CM | POA: Diagnosis not present

## 2020-11-28 DIAGNOSIS — N182 Chronic kidney disease, stage 2 (mild): Secondary | ICD-10-CM | POA: Diagnosis not present

## 2020-11-28 DIAGNOSIS — I10 Essential (primary) hypertension: Secondary | ICD-10-CM | POA: Diagnosis not present

## 2020-12-02 DIAGNOSIS — Z23 Encounter for immunization: Secondary | ICD-10-CM | POA: Diagnosis not present

## 2020-12-24 DIAGNOSIS — C44622 Squamous cell carcinoma of skin of right upper limb, including shoulder: Secondary | ICD-10-CM | POA: Diagnosis not present

## 2020-12-24 DIAGNOSIS — L821 Other seborrheic keratosis: Secondary | ICD-10-CM | POA: Diagnosis not present

## 2020-12-24 DIAGNOSIS — L814 Other melanin hyperpigmentation: Secondary | ICD-10-CM | POA: Diagnosis not present

## 2020-12-24 DIAGNOSIS — Z85828 Personal history of other malignant neoplasm of skin: Secondary | ICD-10-CM | POA: Diagnosis not present

## 2020-12-24 DIAGNOSIS — D225 Melanocytic nevi of trunk: Secondary | ICD-10-CM | POA: Diagnosis not present

## 2020-12-24 DIAGNOSIS — L570 Actinic keratosis: Secondary | ICD-10-CM | POA: Diagnosis not present

## 2021-01-21 DIAGNOSIS — Z23 Encounter for immunization: Secondary | ICD-10-CM | POA: Diagnosis not present

## 2021-01-23 DIAGNOSIS — D472 Monoclonal gammopathy: Secondary | ICD-10-CM | POA: Diagnosis not present

## 2021-02-02 DIAGNOSIS — M859 Disorder of bone density and structure, unspecified: Secondary | ICD-10-CM | POA: Diagnosis not present

## 2021-02-02 DIAGNOSIS — R946 Abnormal results of thyroid function studies: Secondary | ICD-10-CM | POA: Diagnosis not present

## 2021-02-02 DIAGNOSIS — M8589 Other specified disorders of bone density and structure, multiple sites: Secondary | ICD-10-CM | POA: Diagnosis not present

## 2021-02-02 DIAGNOSIS — I10 Essential (primary) hypertension: Secondary | ICD-10-CM | POA: Diagnosis not present

## 2021-02-02 DIAGNOSIS — Z125 Encounter for screening for malignant neoplasm of prostate: Secondary | ICD-10-CM | POA: Diagnosis not present

## 2021-02-02 DIAGNOSIS — E785 Hyperlipidemia, unspecified: Secondary | ICD-10-CM | POA: Diagnosis not present

## 2021-02-12 DIAGNOSIS — M8580 Other specified disorders of bone density and structure, unspecified site: Secondary | ICD-10-CM | POA: Diagnosis not present

## 2021-02-12 DIAGNOSIS — Z0000 Encounter for general adult medical examination without abnormal findings: Secondary | ICD-10-CM | POA: Diagnosis not present

## 2021-02-12 DIAGNOSIS — R911 Solitary pulmonary nodule: Secondary | ICD-10-CM | POA: Diagnosis not present

## 2021-02-12 DIAGNOSIS — I129 Hypertensive chronic kidney disease with stage 1 through stage 4 chronic kidney disease, or unspecified chronic kidney disease: Secondary | ICD-10-CM | POA: Diagnosis not present

## 2021-02-12 DIAGNOSIS — N1831 Chronic kidney disease, stage 3a: Secondary | ICD-10-CM | POA: Diagnosis not present

## 2021-02-12 DIAGNOSIS — R7301 Impaired fasting glucose: Secondary | ICD-10-CM | POA: Diagnosis not present

## 2021-02-12 DIAGNOSIS — I10 Essential (primary) hypertension: Secondary | ICD-10-CM | POA: Diagnosis not present

## 2021-02-12 DIAGNOSIS — E785 Hyperlipidemia, unspecified: Secondary | ICD-10-CM | POA: Diagnosis not present

## 2021-02-12 DIAGNOSIS — D649 Anemia, unspecified: Secondary | ICD-10-CM | POA: Diagnosis not present

## 2021-02-12 DIAGNOSIS — C3430 Malignant neoplasm of lower lobe, unspecified bronchus or lung: Secondary | ICD-10-CM | POA: Diagnosis not present

## 2021-02-12 DIAGNOSIS — S81812A Laceration without foreign body, left lower leg, initial encounter: Secondary | ICD-10-CM | POA: Diagnosis not present

## 2021-02-12 DIAGNOSIS — N401 Enlarged prostate with lower urinary tract symptoms: Secondary | ICD-10-CM | POA: Diagnosis not present

## 2021-02-12 DIAGNOSIS — J45909 Unspecified asthma, uncomplicated: Secondary | ICD-10-CM | POA: Diagnosis not present

## 2021-02-12 DIAGNOSIS — R82998 Other abnormal findings in urine: Secondary | ICD-10-CM | POA: Diagnosis not present

## 2021-02-17 DIAGNOSIS — Z23 Encounter for immunization: Secondary | ICD-10-CM | POA: Diagnosis not present

## 2021-05-07 ENCOUNTER — Telehealth: Payer: Self-pay | Admitting: *Deleted

## 2021-05-07 NOTE — Telephone Encounter (Signed)
CALLED PATIENT TO INFORM OF STAT LAB AND CT ON 05-15-21 - ARRIVAL TIME- 4 PM @ [facility], PATIENT TO HAVE WATER ONLY - 4 HRS. PRIOR TO TEST, PATIENT TO RECEIVE RESULTS FROM ALISON PERKINS ON 05-18-21 @ 3:30 PM VIA TELEPHONE, LVM FOR A RETURN CALL ?

## 2021-05-15 ENCOUNTER — Ambulatory Visit (HOSPITAL_COMMUNITY)
Admission: RE | Admit: 2021-05-15 | Discharge: 2021-05-15 | Disposition: A | Discharge status: Home / Self Care | Payer: Medicare Other | Source: Ambulatory Visit | Attending: Radiation Oncology | Admitting: Radiation Oncology

## 2021-05-15 ENCOUNTER — Encounter: Payer: Self-pay | Admitting: Radiation Oncology

## 2021-05-15 ENCOUNTER — Other Ambulatory Visit: Payer: Self-pay

## 2021-05-15 DIAGNOSIS — R911 Solitary pulmonary nodule: Secondary | ICD-10-CM | POA: Diagnosis not present

## 2021-05-15 DIAGNOSIS — C3431 Malignant neoplasm of lower lobe, right bronchus or lung: Secondary | ICD-10-CM | POA: Diagnosis not present

## 2021-05-15 DIAGNOSIS — J432 Centrilobular emphysema: Secondary | ICD-10-CM | POA: Diagnosis not present

## 2021-05-15 LAB — POCT I-STAT CREATININE: Creatinine, Ser: 1.3 mg/dL — ABNORMAL HIGH (ref 0.61–1.24)

## 2021-05-15 MED ORDER — IOHEXOL 300 MG/ML  SOLN
75.0000 mL | Freq: Once | INTRAMUSCULAR | Status: AC | PRN
  Administered 2021-05-15: 75 mL via INTRAVENOUS

## 2021-05-15 MED ORDER — SODIUM CHLORIDE (PF) 0.9 % IJ SOLN
INTRAMUSCULAR | Status: AC
  Filled 2021-05-15: qty 50

## 2021-05-15 NOTE — Progress Notes (Addendum)
Spoke w/ patient, verified identity, and began nursing interview by phone. Patient is doing well. Reports no issues at this time. ? ?Meaningful use complete. ? ?Patient reminded of his 3:30pm-05/18/21 telephone appointment w/ [name] PA-C. I left my extension [phone] in case patient needs anything. Patient verbalized understanding of information. ? ?Patient contact [phone] ?

## 2021-05-18 ENCOUNTER — Ambulatory Visit
Admission: RE | Admit: 2021-05-18 | Discharge: 2021-05-18 | Disposition: A | Discharge status: Home / Self Care | Payer: Medicare Other | Source: Ambulatory Visit | Attending: Radiation Oncology | Admitting: Radiation Oncology

## 2021-05-18 DIAGNOSIS — C3431 Malignant neoplasm of lower lobe, right bronchus or lung: Secondary | ICD-10-CM | POA: Diagnosis not present

## 2021-05-18 DIAGNOSIS — Z08 Encounter for follow-up examination after completed treatment for malignant neoplasm: Secondary | ICD-10-CM | POA: Diagnosis not present

## 2021-05-18 NOTE — Progress Notes (Signed)
?Radiation Oncology         [phone] ?________________________________ ? ?Outpatient Visit - Conducted via telephone due to current COVID-19 concerns for limiting patient exposure ? ?I spoke with the patient to conduct this consult visit via telephone to spare the patient unnecessary potential exposure in the healthcare setting during the current COVID-19 pandemic. The patient was notified in advance and was offered a Outlook meeting to allow for face to face communication but unfortunately reported that they did not have the appropriate resources/technology to support such a visit and instead preferred to proceed with a telephone visit. ?________________________________ ?  ? ?Name: Felicia Both.        MRN: [id number]  ? ?Date of Service:05/18/21  DOB: [birth year] ? ?YJ:EHUDJS, [name], MD  [name], *    ? ?REFERRING PHYSICIAN: [name], * ? ?DIAGNOSIS: The primary encounter diagnosis was Nodule of lower lobe of right lung. A diagnosis of Malignant neoplasm of bronchus of right lower lobe [facility]) was also pertinent to this visit. ? ?HISTORY OF PRESENT ILLNESS: Ziyan Schoon. is a 88 y.o.  male with a history of putative stage I lung cancer of the right lower lobe. The patient has a longstanding history of smoking,and is a lifelong smoker.  A chest x-ray at [facility] after a flare of asthma  revealed a nodule in the lung.  He proceeded with CT imaging on 10/23/2019 which revealed a 1.1 x 1.4 x 2.8 cm nodule that was spiculated in the right lower lobe, a PET scan on 11/23/2019 revealed an SUV of 3.2 and the nodule in the right lower lobe measured 1.6 x 1.4 cm, blood pool activity was 2.5.  There was also focal hypermetabolic activity in the right infrahilar region with an SUV max of 4.2 however there was no CT correlate for this.  He was counseled on options of proceeding with biopsy but given his age and comorbidities declined, he did have a repeat CT scan with super D  component on 02/19/2020 redemonstrated the lobulated pulmonary nodule in the right lower lobe which abuts the major fissure measuring 1.4 x 1.3 cm. While it was not significantly changed but there was more hypodensity of the internal appearance of the nodule. He was not interested in surgical resection and rather proceeded with Stereotactic Body Radiotherapy (SBRT). He completed this in February 2022.  ? ?Since his treatment, he has been NED with stable emphysematous and atherosclerotic changes. He did have a repeat CT chest on 05/15/21 that showed stable post treatment changes in the RLL measuring 1.7 cm with architectural distortion. No new nodules or suspicious lesions are noted. He's contacted by phone today to review these results.  ? ?PREVIOUS RADIATION THERAPY:  ? ?03/25/2020 through 04/01/2020 SBRT  ?Site Technique Total Dose (Gy) Dose per Fx (Gy) Completed Fx Beam Energies  ?Lung, Right: Lung_Rt IMRT 54/54 18 3/3 6XFFF  ? ? ?  ?PAST MEDICAL HISTORY:  ?Past Medical History:  ?Diagnosis Date  ? Anemia   ? Arthritis   ? Cataract   ? Complication following bilateral mastoidectomy   ? Dysphagia   ? with large pills  ? Gout   ? HLD (hyperlipidemia)   ? HTN (hypertension)   ? Lung nodule 02/19/2020  ? Melanoma ([place]) 2009  ? Left forehead  ? Right posterior capsular opacification 06/21/2019  ? Wrist fracture   ? left  ? ? ?PAST SURGICAL HISTORY: ?Past Surgical History:  ?Procedure Laterality Date  ?  cataracts    ? MASTOIDECTOMY REVISION Bilateral years ago  ? ORIF WRIST FRACTURE Left 1975  ? QUADRICEPS TENDON REPAIR Right 08/23/2018  ? Procedure: REPAIR QUADRICEP TENDON;  Surgeon: Gaynelle Arabian, MD;  Location: [facility];  Service: Orthopedics;  Laterality: Right;  70min  ? TOTAL HIP ARTHROPLASTY Left 05/05/2016  ? Procedure: LEFT TOTAL HIP ARTHROPLASTY ANTERIOR APPROACH;  Surgeon: Gaynelle Arabian, MD;  Location: [facility];  Service: Orthopedics;  Laterality: Left;  ? ? ?PAST SOCIAL HISTORY:  ?Social History  ? ?Socioeconomic  History  ? Marital status: Widowed  ?  Spouse name: Not on file  ? Number of children: 2  ? Years of education: Not on file  ? Highest education level: Not on file  ?Occupational History  ? Occupation: retired  ?Tobacco Use  ? Smoking status: Never  ? Smokeless tobacco: Never  ?Vaping Use  ? Vaping Use: Never used  ?Substance and Sexual Activity  ? Alcohol use: No  ? Drug use: No  ? Sexual activity: Not on file  ?Other Topics Concern  ? Not on file  ?Social History Narrative  ? Not on file  ? ?Social Determinants of Health  ? ?Financial Resource Strain: Not on file  ?Food Insecurity: Not on file  ?Transportation Needs: Not on file  ?Physical Activity: Not on file  ?Stress: Not on file  ?Social Connections: Not on file  ?Intimate Partner Violence: Not on file  ? ? ?PAST FAMILY HISTORY: ?Family History  ?Problem Relation Age of Onset  ? Pneumonia Mother   ? Heart attack Father   ? Breast cancer Sister   ? Heart attack Nephew 66  ? Heart failure Sister   ? Kidney Stones Daughter   ? Esophageal cancer Neg Hx   ? Colon cancer Neg Hx   ? Rectal cancer Neg Hx   ? Stomach cancer Neg Hx   ? ? ?MEDICATIONS  ?Current Outpatient Medications  ?Medication Sig Dispense Refill  ? amLODipine (NORVASC) 2.5 MG tablet Take 2.5 mg by mouth daily.    ? Amlodipine Besylate POWD amlodipine besylate (bulk)    ? aspirin EC 81 MG tablet Take 81 mg by mouth daily. (Patient not taking: No sig reported)    ? budesonide-formoterol (SYMBICORT) 160-4.5 MCG/ACT inhaler Symbicort (Patient not taking: No sig reported)    ? calcium carbonate (TUMS - DOSED IN MG ELEMENTAL CALCIUM) 500 MG chewable tablet Chew 1-2 tablets by mouth as needed for indigestion or heartburn.    ? Calcium-Vitamin D-Vitamin K (VIACTIV CALCIUM PLUS D) 650-12.5-40 MG-MCG-MCG CHEW Chew 1 each by mouth daily.    ? Camphor-Eucalyptus-Menthol (CHEST RUB) 4.8-1.2-2.6 % OINT Vicks Vaporub    ? chlorthalidone (HYGROTON) 25 MG tablet Take 12.5 mg by mouth daily.    ? Cholecalciferol  (VITAMIN D) 50 MCG (2000 UT) tablet Take 2,000 Units by mouth daily.    ? Cyanocobalamin (B-12) 2500 MCG TABS Take 2,500 mcg by mouth daily.    ? finasteride (PROPECIA) 1 MG tablet finasteride    ? finasteride (PROSCAR) 5 MG tablet Take 5 mg by mouth daily.    ? Multiple Minerals-Vitamins (BONE ESSENTIALS) CAPS calcium    ? NON FORMULARY Take 1 tablet by mouth daily. Vision Vite - 1 tablet daily    ? Omega-3 Fatty Acids (RA FISH OIL) 1000 MG CAPS Fish Oil (Patient not taking: No sig reported)    ? polyethylene glycol (MIRALAX / GLYCOLAX) packet Take 17 g by mouth daily as needed for mild constipation.     ?  rosuvastatin (CRESTOR) 5 MG tablet Take 5 mg by mouth daily. (Patient not taking: No sig reported)    ? Wheat Dextrin (BENEFIBER) POWD Take 10 mLs by mouth daily. 2 tsp Daily    ? ?Current Facility-Administered Medications  ?Medication Dose Route Frequency Provider Last Rate Last Admin  ? 0.9 %  sodium chloride infusion  500 mL Intravenous Once Irene Shipper, MD      ? ? ?ALLERGIES:  ?Allergies  ?Allergen Reactions  ? Other Other (See Comments)  ?  Pt can not take gel caps - med gets stuck in throat   ? ? ?  ? ?REVIEW OF SYSTEMS: On review of systems, the patient reports that he is doing very well.  He is not having any cough, shortness of breath or chills.  He is not having any chest wall pain.  He does continue to desire an aggressive approach for his surveillance as long as this is clinically appropriate.  He does have thickened saliva at times but this is the only thing that he has noticed.  No other complaints are verbalized. ?  ? ?  ? ?PHYSICAL EXAM:  ?Unable to assess due to encounter type. ? ? ? ?ECOG = 0 ?0 - Asymptomatic (Fully active, able to carry on all predisease activities without restriction) ?1 - Symptomatic but completely ambulatory (Restricted in physically strenuous activity but ambulatory and able to carry out work of a light or sedentary nature. For example, light housework, office work) ?2  - Symptomatic, <50% in bed during the day (Ambulatory and capable of all self care but unable to carry out any work activities. Up and about more than 50% of waking hours) ?3 - Symptomatic, >50% in bed, but not bedb

## 2021-07-01 DIAGNOSIS — H353132 Nonexudative age-related macular degeneration, bilateral, intermediate dry stage: Secondary | ICD-10-CM | POA: Diagnosis not present

## 2021-07-16 DIAGNOSIS — N400 Enlarged prostate without lower urinary tract symptoms: Secondary | ICD-10-CM | POA: Diagnosis not present

## 2021-08-18 DIAGNOSIS — H9212 Otorrhea, left ear: Secondary | ICD-10-CM | POA: Diagnosis not present

## 2021-08-18 DIAGNOSIS — H7202 Central perforation of tympanic membrane, left ear: Secondary | ICD-10-CM | POA: Diagnosis not present

## 2021-08-24 ENCOUNTER — Ambulatory Visit (INDEPENDENT_AMBULATORY_CARE_PROVIDER_SITE_OTHER): Payer: Medicare Other | Admitting: Ophthalmology

## 2021-08-24 DIAGNOSIS — H353112 Nonexudative age-related macular degeneration, right eye, intermediate dry stage: Secondary | ICD-10-CM

## 2021-08-24 DIAGNOSIS — Z9889 Other specified postprocedural states: Secondary | ICD-10-CM | POA: Diagnosis not present

## 2021-08-24 DIAGNOSIS — H35371 Puckering of macula, right eye: Secondary | ICD-10-CM | POA: Diagnosis not present

## 2021-08-24 DIAGNOSIS — H35372 Puckering of macula, left eye: Secondary | ICD-10-CM | POA: Diagnosis not present

## 2021-08-24 DIAGNOSIS — H401123 Primary open-angle glaucoma, left eye, severe stage: Secondary | ICD-10-CM | POA: Diagnosis not present

## 2021-08-24 NOTE — Assessment & Plan Note (Signed)
Impact on acuity patient still like to observe.

## 2021-08-24 NOTE — Assessment & Plan Note (Signed)
Follow-up with Dr. Katy Fitch as scheduled ?

## 2021-08-31 DIAGNOSIS — I10 Essential (primary) hypertension: Secondary | ICD-10-CM | POA: Diagnosis not present

## 2021-08-31 DIAGNOSIS — R911 Solitary pulmonary nodule: Secondary | ICD-10-CM | POA: Diagnosis not present

## 2021-08-31 DIAGNOSIS — D472 Monoclonal gammopathy: Secondary | ICD-10-CM | POA: Diagnosis not present

## 2021-08-31 DIAGNOSIS — R7301 Impaired fasting glucose: Secondary | ICD-10-CM | POA: Diagnosis not present

## 2021-08-31 DIAGNOSIS — I129 Hypertensive chronic kidney disease with stage 1 through stage 4 chronic kidney disease, or unspecified chronic kidney disease: Secondary | ICD-10-CM | POA: Diagnosis not present

## 2021-08-31 DIAGNOSIS — D649 Anemia, unspecified: Secondary | ICD-10-CM | POA: Diagnosis not present

## 2021-08-31 DIAGNOSIS — M8580 Other specified disorders of bone density and structure, unspecified site: Secondary | ICD-10-CM | POA: Diagnosis not present

## 2021-08-31 DIAGNOSIS — E785 Hyperlipidemia, unspecified: Secondary | ICD-10-CM | POA: Diagnosis not present

## 2021-08-31 DIAGNOSIS — J45909 Unspecified asthma, uncomplicated: Secondary | ICD-10-CM | POA: Diagnosis not present

## 2021-09-18 ENCOUNTER — Institutional Professional Consult (permissible substitution): Payer: Medicare Other | Admitting: Internal Medicine

## 2021-09-23 ENCOUNTER — Encounter: Payer: Self-pay | Admitting: Pulmonary Disease

## 2021-09-23 ENCOUNTER — Telehealth: Payer: Self-pay | Admitting: Pulmonary Disease

## 2021-09-23 ENCOUNTER — Ambulatory Visit (INDEPENDENT_AMBULATORY_CARE_PROVIDER_SITE_OTHER): Payer: Medicare Other | Admitting: Pulmonary Disease

## 2021-09-23 VITALS — BP 120/70 | HR 42 | Temp 98.0°F | Ht 67.0 in | Wt 130.8 lb

## 2021-09-23 DIAGNOSIS — R0982 Postnasal drip: Secondary | ICD-10-CM | POA: Diagnosis not present

## 2021-09-23 DIAGNOSIS — J4520 Mild intermittent asthma, uncomplicated: Secondary | ICD-10-CM

## 2021-09-23 DIAGNOSIS — R0981 Nasal congestion: Secondary | ICD-10-CM

## 2021-09-23 MED ORDER — FLUTICASONE PROPIONATE 50 MCG/ACT NA SUSP: 1.0000 | Freq: Every day | NASAL | 2 refills | Status: DC

## 2021-09-23 MED ORDER — IPRATROPIUM BROMIDE 0.03 % NA SOLN: 2.0000 | Freq: Two times a day (BID) | NASAL | 12 refills | Status: DC

## 2021-09-23 NOTE — Progress Notes (Signed)
Synopsis: Referred in July 2023 for Asthma/COPD by Crist Infante, MD  Subjective:   PATIENT ID: [name]. GENDER: male DOB: 1924-04-20, MRN: [id number]  HPI  Chief Complaint  Patient presents with   Consult    Consult: Asthma   [name] is a 88 year old male, never smoker with hypertension, hyperlipidemia, melanoma and RLL stage I lung cancer s/p SBRT 2022 who is referred to pulmonary clinic asthma/COPD.  He reports increased wheezing and cough with mucous production in the morning times over the past year. He has constant sinus congestion and post-nasal drainage where he feels like he always has phlegm in his throat. He denies issues with dyspnea as he is able to complete all of his house work. He does have mild reflux disease that is managed with as needed TUMS. He denies seasonal allergies. He denies issues with his breathing related to weather changes. He reports childhood asthma. He may wake up occasionally coughing up mucous.  He reports using either symbicort or advair diskus inhaler for his morning symptoms which helps.   He is retired and widowed. He worked as an Optometrist and as a Engineering geologist.    Past Medical History:  Diagnosis Date   Anemia    Arthritis    Cataract    Complication following bilateral mastoidectomy    Dysphagia    with large pills   Gout    HLD (hyperlipidemia)    HTN (hypertension)    Lung nodule 02/19/2020   Melanoma ([place]) 2009   Left forehead   Right posterior capsular opacification 06/21/2019   Wrist fracture    left     Family History  Problem Relation Age of Onset   Pneumonia Mother    Heart attack Father    Breast cancer Sister    Heart attack Nephew 66   Heart failure Sister    Kidney Stones Daughter    Esophageal cancer Neg Hx    Colon cancer Neg Hx    Rectal cancer Neg Hx    Stomach cancer Neg Hx      Social History   Socioeconomic History   Marital status: Widowed    Spouse name: Not on file    Number of children: 2   Years of education: Not on file   Highest education level: Not on file  Occupational History   Occupation: retired  Tobacco Use   Smoking status: Never   Smokeless tobacco: Never  Vaping Use   Vaping Use: Never used  Substance and Sexual Activity   Alcohol use: No   Drug use: No   Sexual activity: Not on file  Other Topics Concern   Not on file  Social History Narrative   Not on file   Social Determinants of Health   Financial Resource Strain: Not on file  Food Insecurity: Not on file  Transportation Needs: Not on file  Physical Activity: Not on file  Stress: Not on file  Social Connections: Not on file  Intimate Partner Violence: Not on file     Allergies  Allergen Reactions   Other Other (See Comments)    Pt can not take gel caps - med gets stuck in throat      Outpatient Medications Prior to Visit  Medication Sig Dispense Refill   budesonide-formoterol (SYMBICORT) 160-4.5 MCG/ACT inhaler Symbicort     ciprofloxacin (CILOXAN) 0.3 % ophthalmic solution 5 drops to left ear twice daily     amLODipine (NORVASC) 2.5 MG  tablet Take 2.5 mg by mouth daily.     Amlodipine Besylate POWD amlodipine besylate (bulk)     aspirin EC 81 MG tablet Take 81 mg by mouth daily. (Patient not taking: No sig reported)     calcium carbonate (TUMS - DOSED IN MG ELEMENTAL CALCIUM) 500 MG chewable tablet Chew 1-2 tablets by mouth as needed for indigestion or heartburn.     Calcium-Vitamin D-Vitamin K (VIACTIV CALCIUM PLUS D) 650-12.5-40 MG-MCG-MCG CHEW Chew 1 each by mouth daily.     Camphor-Eucalyptus-Menthol (CHEST RUB) 4.8-1.2-2.6 % OINT Vicks Vaporub     chlorthalidone (HYGROTON) 25 MG tablet Take 12.5 mg by mouth daily.     Cholecalciferol (VITAMIN D) 50 MCG (2000 UT) tablet Take 2,000 Units by mouth daily.     Cyanocobalamin (B-12) 2500 MCG TABS Take 2,500 mcg by mouth daily.     finasteride (PROPECIA) 1 MG tablet finasteride     finasteride (PROSCAR) 5 MG  tablet Take 5 mg by mouth daily.     Multiple Minerals-Vitamins (BONE ESSENTIALS) CAPS calcium     NON FORMULARY Take 1 tablet by mouth daily. Vision Vite - 1 tablet daily     Omega-3 Fatty Acids (RA FISH OIL) 1000 MG CAPS Fish Oil (Patient not taking: No sig reported)     polyethylene glycol (MIRALAX / GLYCOLAX) packet Take 17 g by mouth daily as needed for mild constipation.      rosuvastatin (CRESTOR) 5 MG tablet Take 5 mg by mouth daily. (Patient not taking: No sig reported)     Wheat Dextrin (BENEFIBER) POWD Take 10 mLs by mouth daily. 2 tsp Daily     Facility-Administered Medications Prior to Visit  Medication Dose Route Frequency Provider Last Rate Last Admin   0.9 %  sodium chloride infusion  500 mL Intravenous Once Irene Shipper, MD       Review of Systems  Constitutional:  Negative for chills, fever, malaise/fatigue and weight loss.  HENT:  Positive for congestion. Negative for sinus pain and sore throat.   Eyes: Negative.   Respiratory:  Positive for cough, sputum production and wheezing. Negative for hemoptysis and shortness of breath.   Cardiovascular:  Negative for chest pain, palpitations, orthopnea, claudication and leg swelling.  Gastrointestinal:  Positive for heartburn. Negative for abdominal pain, nausea and vomiting.  Genitourinary: Negative.   Musculoskeletal:  Negative for joint pain and myalgias.  Skin:  Negative for rash.  Neurological:  Negative for weakness.  Endo/Heme/Allergies: Negative.   Psychiatric/Behavioral: Negative.     Objective:   Vitals:   09/23/21 1434  BP: 120/70  Pulse: (!) 42  Temp: 98 F (36.7 C)  TempSrc: Oral  SpO2: 98%  Weight: 130 lb 12.8 oz (59.3 kg)  Height: 5\' 7"  (1.702 m)    Physical Exam Constitutional:      General: He is not in acute distress. HENT:     Head: Normocephalic and atraumatic.  Eyes:     Extraocular Movements: Extraocular movements intact.     Conjunctiva/sclera: Conjunctivae normal.     Pupils: Pupils  are equal, round, and reactive to light.  Cardiovascular:     Rate and Rhythm: Normal rate and regular rhythm.     Pulses: Normal pulses.     Heart sounds: Normal heart sounds. No murmur heard. Pulmonary:     Effort: Pulmonary effort is normal.     Breath sounds: Normal breath sounds.  Abdominal:     General: Bowel sounds are normal.  Palpations: Abdomen is soft.  Musculoskeletal:     Right lower leg: No edema.     Left lower leg: No edema.  Lymphadenopathy:     Cervical: No cervical adenopathy.  Skin:    General: Skin is warm and dry.  Neurological:     General: No focal deficit present.     Mental Status: He is alert.  Psychiatric:        Mood and Affect: Mood normal.        Behavior: Behavior normal.        Thought Content: Thought content normal.        Judgment: Judgment normal.    CBC    Component Value Date/Time   WBC 5.3 08/21/2018 1342   RBC 3.44 (L) 08/21/2018 1342   HGB 11.2 (L) 08/21/2018 1342   HGB 11.9 (L) 09/29/2016 0801   HCT 35.0 (L) 08/21/2018 1342   HCT 35.5 (L) 09/29/2016 0801   PLT 236 08/21/2018 1342   PLT 175 09/29/2016 0801   MCV 101.7 (H) 08/21/2018 1342   MCV 96.2 09/29/2016 0801   MCH 32.6 08/21/2018 1342   MCHC 32.0 08/21/2018 1342   RDW 12.7 08/21/2018 1342   RDW 13.4 09/29/2016 0801   LYMPHSABS 1.2 09/29/2016 0801   MONOABS 0.3 09/29/2016 0801   EOSABS 0.3 09/29/2016 0801   BASOSABS 0.0 09/29/2016 0801      Latest Ref Rng & Units 05/15/2021    4:36 PM 11/12/2020   11:06 AM 05/12/2020   11:32 AM  BMP  BUN 8 - 23 mg/dL  24  27   Creatinine 0.61 - 1.24 mg/dL 1.30  1.24  1.29    Chest imaging: CT Chest w contrast 05/15/21 1. Treated right lower lobe pulmonary nodule is grossly stable compared to the prior study with surrounding postradiation changes, as above. No signs of new metastatic disease are noted in the thorax. 2. Mild diffuse bronchial wall thickening with very mild centrilobular and paraseptal emphysema; imaging  findings suggestive of underlying COPD. 3. Aortic atherosclerosis, in addition to left main and three-vessel coronary artery disease. 4. There are calcifications of the aortic valve and mitral annulus. Echocardiographic correlation for evaluation of potential valvular dysfunction may be warranted if clinically indicated. 5. Mild cardiomegaly.  PFT:     No data to display          Labs:  Path:  Echo:  Heart Catheterization:  Assessment & Plan:   Mild intermittent asthma without complication  Sinus congestion - Plan: fluticasone (FLONASE) 50 MCG/ACT nasal spray, ipratropium (ATROVENT) 0.03 % nasal spray  Post-nasal drip - Plan: fluticasone (FLONASE) 50 MCG/ACT nasal spray, ipratropium (ATROVENT) 0.03 % nasal spray  Discussion: [name] is a 88 year old male, never smoker with hypertension, hyperlipidemia, melanoma and RLL stage I lung cancer s/p SBRT 2022 who is referred to pulmonary clinic asthma/COPD.  He has mild intermittent asthma that he is using ICS/LABA inhaler as needed more so in the morning times with relief. His asthma could be aggravated by sinus disease with post-nasal drainage and possibly by GERD.   He is to use his ICS/LABA inhaler twice daily. He is to call us with the inhaler he is using as there is confusion between advair diskus and listed symbicort in his chart.   He is to start fluticasone nasal spray daily and ipratropium nasal spray twice daily for his sinus congestion and drainage.  Follow up in 4-6 weeks for monitoring of symptoms and response to  therapy.  Freda Jackson, MD [facility] Office: [phone]   Current Outpatient Medications:    budesonide-formoterol (SYMBICORT) 160-4.5 MCG/ACT inhaler, Symbicort, Disp: , Rfl:    ciprofloxacin (CILOXAN) 0.3 % ophthalmic solution, 5 drops to left ear twice daily, Disp: , Rfl:    fluticasone (FLONASE) 50 MCG/ACT nasal spray, Place 1 spray into both nostrils daily.,  Disp: 16 g, Rfl: 2   ipratropium (ATROVENT) 0.03 % nasal spray, Place 2 sprays into both nostrils every 12 (twelve) hours., Disp: 30 mL, Rfl: 12   amLODipine (NORVASC) 2.5 MG tablet, Take 2.5 mg by mouth daily., Disp: , Rfl:    Amlodipine Besylate POWD, amlodipine besylate (bulk), Disp: , Rfl:    aspirin EC 81 MG tablet, Take 81 mg by mouth daily. (Patient not taking: No sig reported), Disp: , Rfl:    calcium carbonate (TUMS - DOSED IN MG ELEMENTAL CALCIUM) 500 MG chewable tablet, Chew 1-2 tablets by mouth as needed for indigestion or heartburn., Disp: , Rfl:    Calcium-Vitamin D-Vitamin K (VIACTIV CALCIUM PLUS D) 650-12.5-40 MG-MCG-MCG CHEW, Chew 1 each by mouth daily., Disp: , Rfl:    Camphor-Eucalyptus-Menthol (CHEST RUB) 4.8-1.2-2.6 % OINT, Vicks Vaporub, Disp: , Rfl:    chlorthalidone (HYGROTON) 25 MG tablet, Take 12.5 mg by mouth daily., Disp: , Rfl:    Cholecalciferol (VITAMIN D) 50 MCG (2000 UT) tablet, Take 2,000 Units by mouth daily., Disp: , Rfl:    Cyanocobalamin (B-12) 2500 MCG TABS, Take 2,500 mcg by mouth daily., Disp: , Rfl:    finasteride (PROPECIA) 1 MG tablet, finasteride, Disp: , Rfl:    finasteride (PROSCAR) 5 MG tablet, Take 5 mg by mouth daily., Disp: , Rfl:    Multiple Minerals-Vitamins (BONE ESSENTIALS) CAPS, calcium, Disp: , Rfl:    NON FORMULARY, Take 1 tablet by mouth daily. Vision Vite - 1 tablet daily, Disp: , Rfl:    Omega-3 Fatty Acids (RA FISH OIL) 1000 MG CAPS, Fish Oil (Patient not taking: No sig reported), Disp: , Rfl:    polyethylene glycol (MIRALAX / GLYCOLAX) packet, Take 17 g by mouth daily as needed for mild constipation. , Disp: , Rfl:    rosuvastatin (CRESTOR) 5 MG tablet, Take 5 mg by mouth daily. (Patient not taking: No sig reported), Disp: , Rfl:    Wheat Dextrin (BENEFIBER) POWD, Take 10 mLs by mouth daily. 2 tsp Daily, Disp: , Rfl:   Current Facility-Administered Medications:    0.9 %  sodium chloride infusion, 500 mL, Intravenous, Once, Irene Shipper, MD

## 2021-09-23 NOTE — Patient Instructions (Signed)
Use advair inhaler 1 puff twice daily - rinse mouth out after each use  Please message [place] through my chart or give [place] a call with the dose of your advair inhaler.  Start fluticasone nasal spray, 1 spray per nostril daily  Start iptropium nasal spray, 2 sprays per nostril twice daily  Follow up in 4-6 weeks

## 2021-09-24 MED ORDER — FLUTICASONE FUROATE-VILANTEROL 100-25 MCG/ACT IN AEPB: 1.0000 | INHALATION_SPRAY | Freq: Every day | RESPIRATORY_TRACT | Status: DC

## 2021-09-24 NOTE — Telephone Encounter (Signed)
Called patient and added medication to the current medication list. Patient verbalized understanding. Confirmed with patient the medication. Nothing further needed

## 2021-10-18 DIAGNOSIS — M79645 Pain in left finger(s): Secondary | ICD-10-CM | POA: Diagnosis not present

## 2021-10-26 ENCOUNTER — Telehealth: Payer: Self-pay | Admitting: *Deleted

## 2021-10-26 NOTE — Telephone Encounter (Signed)
CALLED PATIENT'S DAUGHTER- NANCY SMINROW TO INFORM OF HER DAD'S CT FOR 11-18-21- ARRIVAL TIME- 11 AM @ [facility], PATIENT TO HAVE STAT LABS DONE @ [facility], PATIENT TO HAVE WATER ONLY-4 HRS. PRIOR TO TEST, PATIENT TO RECEIVE RESULTS FROM [facility] PERKINS ON 11-23-21 @ 1:30 PM VIA TELEPHONE, SPOKE WITH PATIENT'S DAUGHTER NANCY S. AND SHE IS AWARE OF THESE APPTS. AND THE INSTRUCTIONS

## 2021-10-27 ENCOUNTER — Ambulatory Visit (INDEPENDENT_AMBULATORY_CARE_PROVIDER_SITE_OTHER): Payer: Medicare Other | Admitting: Pulmonary Disease

## 2021-10-27 ENCOUNTER — Encounter: Payer: Self-pay | Admitting: Pulmonary Disease

## 2021-10-27 VITALS — BP 112/58 | HR 68 | Temp 97.4°F | Ht 67.0 in | Wt 132.0 lb

## 2021-10-27 DIAGNOSIS — J4520 Mild intermittent asthma, uncomplicated: Secondary | ICD-10-CM

## 2021-10-27 DIAGNOSIS — R0982 Postnasal drip: Secondary | ICD-10-CM

## 2021-10-27 DIAGNOSIS — R0981 Nasal congestion: Secondary | ICD-10-CM | POA: Diagnosis not present

## 2021-10-27 DIAGNOSIS — M79645 Pain in left finger(s): Secondary | ICD-10-CM | POA: Diagnosis not present

## 2021-10-27 MED ORDER — FEXOFENADINE HCL 180 MG PO TABS: 180.0000 mg | ORAL_TABLET | Freq: Every day | ORAL | 6 refills | Status: DC

## 2021-10-27 NOTE — Patient Instructions (Signed)
Use advair inhaler 1 puff twice daily - rinse mouth out after each use   Use fluticasone nasal spray, 1 spray per nostril daily   Use iptropium nasal spray, 2 sprays per nostril twice daily  Start Allegra (fexofenidine) daily for runny nose and throat congestion  Follow up in 6 months

## 2021-10-27 NOTE — Progress Notes (Signed)
Synopsis: Referred in July 2023 for Asthma/COPD by Crist Infante, MD  Subjective:   PATIENT ID: [name]. GENDER: male DOB: November 22, 1924, MRN: [id number]  HPI  Chief Complaint  Patient presents with   Follow-up    Breathing is doing well. No new co's.    [name] is a 88 year old male, never smoker with hypertension, hyperlipidemia, melanoma and RLL stage I lung cancer s/p SBRT 2022 who returns to pulmonary clinic asthma/COPD.  He has been using advair diskus 100-50mcg 1 puff twice daily. He was started on fluticasone and ipratropium nasal sprays at last visit. He reports his wheezing and cough are much better. He continues to have some cough with mucous congestion in his throat.   Initial OV 09/23/21 He reports increased wheezing and cough with mucous production in the morning times over the past year. He has constant sinus congestion and post-nasal drainage where he feels like he always has phlegm in his throat. He denies issues with dyspnea as he is able to complete all of his house work. He does have mild reflux disease that is managed with as needed TUMS. He denies seasonal allergies. He denies issues with his breathing related to weather changes. He reports childhood asthma. He may wake up occasionally coughing up mucous.  He reports using either symbicort or advair diskus inhaler for his morning symptoms which helps.   He is retired and widowed. He worked as an Optometrist and as a Engineering geologist.    Past Medical History:  Diagnosis Date   Anemia    Arthritis    Cataract    Complication following bilateral mastoidectomy    Dysphagia    with large pills   Gout    HLD (hyperlipidemia)    HTN (hypertension)    Lung nodule 02/19/2020   Melanoma ([place]) 2009   Left forehead   Right posterior capsular opacification 06/21/2019   Wrist fracture    left     Family History  Problem Relation Age of Onset   Pneumonia Mother    Heart attack Father    Breast  cancer Sister    Heart attack Nephew 66   Heart failure Sister    Kidney Stones Daughter    Esophageal cancer Neg Hx    Colon cancer Neg Hx    Rectal cancer Neg Hx    Stomach cancer Neg Hx      Social History   Socioeconomic History   Marital status: Widowed    Spouse name: Not on file   Number of children: 2   Years of education: Not on file   Highest education level: Not on file  Occupational History   Occupation: retired  Tobacco Use   Smoking status: Never   Smokeless tobacco: Never  Vaping Use   Vaping Use: Never used  Substance and Sexual Activity   Alcohol use: No   Drug use: No   Sexual activity: Not on file  Other Topics Concern   Not on file  Social History Narrative   Not on file   Social Determinants of Health   Financial Resource Strain: Not on file  Food Insecurity: Not on file  Transportation Needs: Not on file  Physical Activity: Not on file  Stress: Not on file  Social Connections: Not on file  Intimate Partner Violence: Not on file     Allergies  Allergen Reactions   Other Other (See Comments)    Pt can not take gel caps -  med gets stuck in throat      Outpatient Medications Prior to Visit  Medication Sig Dispense Refill   amLODipine (NORVASC) 2.5 MG tablet Take 2.5 mg by mouth daily.     Amlodipine Besylate POWD amlodipine besylate (bulk)     aspirin EC 81 MG tablet Take 81 mg by mouth daily.     calcium carbonate (TUMS - DOSED IN MG ELEMENTAL CALCIUM) 500 MG chewable tablet Chew 1-2 tablets by mouth as needed for indigestion or heartburn.     Calcium-Vitamin D-Vitamin K (VIACTIV CALCIUM PLUS D) 650-12.5-40 MG-MCG-MCG CHEW Chew 1 each by mouth daily.     Camphor-Eucalyptus-Menthol (CHEST RUB) 4.8-1.2-2.6 % OINT Vicks Vaporub     chlorthalidone (HYGROTON) 25 MG tablet Take 12.5 mg by mouth daily.     Cholecalciferol (VITAMIN D) 50 MCG (2000 UT) tablet Take 2,000 Units by mouth daily.     ciprofloxacin (CILOXAN) 0.3 % ophthalmic solution 5  drops to left ear twice daily     Cyanocobalamin (B-12) 2500 MCG TABS Take 2,500 mcg by mouth daily.     finasteride (PROPECIA) 1 MG tablet finasteride     finasteride (PROSCAR) 5 MG tablet Take 5 mg by mouth daily.     fluticasone (FLONASE) 50 MCG/ACT nasal spray Place 1 spray into both nostrils daily. 16 g 2   fluticasone-salmeterol (ADVAIR) 100-50 MCG/ACT AEPB Inhale 1 puff into the lungs 2 (two) times daily.     ipratropium (ATROVENT) 0.03 % nasal spray Place 2 sprays into both nostrils every 12 (twelve) hours. 30 mL 12   Multiple Minerals-Vitamins (BONE ESSENTIALS) CAPS calcium     NON FORMULARY Take 1 tablet by mouth daily. Vision Vite - 1 tablet daily     Omega-3 Fatty Acids (RA FISH OIL) 1000 MG CAPS Fish Oil     polyethylene glycol (MIRALAX / GLYCOLAX) packet Take 17 g by mouth daily as needed for mild constipation.      rosuvastatin (CRESTOR) 5 MG tablet Take 5 mg by mouth daily.     Wheat Dextrin (BENEFIBER) POWD Take 10 mLs by mouth daily. 2 tsp Daily     budesonide-formoterol (SYMBICORT) 160-4.5 MCG/ACT inhaler Symbicort     fluticasone furoate-vilanterol (BREO ELLIPTA) 100-25 MCG/ACT AEPB Inhale 1 puff into the lungs daily.     Facility-Administered Medications Prior to Visit  Medication Dose Route Frequency Provider Last Rate Last Admin   0.9 %  sodium chloride infusion  500 mL Intravenous Once Irene Shipper, MD       Review of Systems  Constitutional:  Negative for chills, fever, malaise/fatigue and weight loss.  HENT:  Positive for congestion. Negative for sinus pain and sore throat.   Eyes: Negative.   Respiratory:  Positive for cough and sputum production. Negative for hemoptysis, shortness of breath and wheezing.   Cardiovascular:  Negative for chest pain, palpitations, orthopnea, claudication and leg swelling.  Gastrointestinal:  Negative for abdominal pain, heartburn, nausea and vomiting.  Genitourinary: Negative.   Musculoskeletal:  Negative for joint pain and  myalgias.  Skin:  Negative for rash.  Neurological:  Negative for weakness.  Endo/Heme/Allergies: Negative.   Psychiatric/Behavioral: Negative.     Objective:   Vitals:   10/27/21 1439  BP: (!) 112/58  Pulse: 68  Temp: (!) 97.4 F (36.3 C)  TempSrc: Oral  SpO2: 97%  Weight: 132 lb (59.9 kg)  Height: 5\' 7"  (1.702 m)    Physical Exam Constitutional:      General: He is not  in acute distress. HENT:     Head: Normocephalic and atraumatic.  Cardiovascular:     Rate and Rhythm: Normal rate and regular rhythm.     Pulses: Normal pulses.     Heart sounds: Normal heart sounds. No murmur heard. Pulmonary:     Effort: Pulmonary effort is normal.     Breath sounds: Normal breath sounds.  Musculoskeletal:     Right lower leg: No edema.     Left lower leg: No edema.  Skin:    General: Skin is warm and dry.  Neurological:     General: No focal deficit present.     Mental Status: He is alert.  Psychiatric:        Mood and Affect: Mood normal.        Behavior: Behavior normal.        Thought Content: Thought content normal.        Judgment: Judgment normal.    CBC    Component Value Date/Time   WBC 5.3 08/21/2018 1342   RBC 3.44 (L) 08/21/2018 1342   HGB 11.2 (L) 08/21/2018 1342   HGB 11.9 (L) 09/29/2016 0801   HCT 35.0 (L) 08/21/2018 1342   HCT 35.5 (L) 09/29/2016 0801   PLT 236 08/21/2018 1342   PLT 175 09/29/2016 0801   MCV 101.7 (H) 08/21/2018 1342   MCV 96.2 09/29/2016 0801   MCH 32.6 08/21/2018 1342   MCHC 32.0 08/21/2018 1342   RDW 12.7 08/21/2018 1342   RDW 13.4 09/29/2016 0801   LYMPHSABS 1.2 09/29/2016 0801   MONOABS 0.3 09/29/2016 0801   EOSABS 0.3 09/29/2016 0801   BASOSABS 0.0 09/29/2016 0801      Latest Ref Rng & Units 05/15/2021    4:36 PM 11/12/2020   11:06 AM 05/12/2020   11:32 AM  BMP  BUN 8 - 23 mg/dL  24  27   Creatinine 0.61 - 1.24 mg/dL 1.30  1.24  1.29    Chest imaging: CT Chest w contrast 05/15/21 1. Treated right lower lobe pulmonary  nodule is grossly stable compared to the prior study with surrounding postradiation changes, as above. No signs of new metastatic disease are noted in the thorax. 2. Mild diffuse bronchial wall thickening with very mild centrilobular and paraseptal emphysema; imaging findings suggestive of underlying COPD. 3. Aortic atherosclerosis, in addition to left main and three-vessel coronary artery disease. 4. There are calcifications of the aortic valve and mitral annulus. Echocardiographic correlation for evaluation of potential valvular dysfunction may be warranted if clinically indicated. 5. Mild cardiomegaly.  PFT:     No data to display          Labs:  Path:  Echo:  Heart Catheterization:  Assessment & Plan:   Mild intermittent asthma without complication  Sinus congestion - Plan: fexofenadine (ALLEGRA ALLERGY) 180 MG tablet  Post-nasal drip - Plan: fexofenadine (ALLEGRA ALLERGY) 180 MG tablet  Discussion: [name] is a 88 year old male, never smoker with hypertension, hyperlipidemia, melanoma and RLL stage I lung cancer s/p SBRT 2022 who returns to pulmonary clinic asthma/COPD.  He is to continue advair diskus 100-36mcg 1 puff twice daily along with fluticasone nasal spray daily and ipratropium nasal spray twice daily. We will add fexofenadine 180mg  daily for his on going cough and throat congestion.  We can consider empiric treatment of GERD in the future if he has ongoing cough with throat congestion.  Follow up in 6 months.  Freda Jackson, MD [facility] Office: [phone]   Current Outpatient Medications:    amLODipine (NORVASC) 2.5 MG tablet, Take 2.5 mg by mouth daily., Disp: , Rfl:    Amlodipine Besylate POWD, amlodipine besylate (bulk), Disp: , Rfl:    aspirin EC 81 MG tablet, Take 81 mg by mouth daily., Disp: , Rfl:    calcium carbonate (TUMS - DOSED IN MG ELEMENTAL CALCIUM) 500 MG chewable tablet, Chew 1-2 tablets by  mouth as needed for indigestion or heartburn., Disp: , Rfl:    Calcium-Vitamin D-Vitamin K (VIACTIV CALCIUM PLUS D) 650-12.5-40 MG-MCG-MCG CHEW, Chew 1 each by mouth daily., Disp: , Rfl:    Camphor-Eucalyptus-Menthol (CHEST RUB) 4.8-1.2-2.6 % OINT, Vicks Vaporub, Disp: , Rfl:    chlorthalidone (HYGROTON) 25 MG tablet, Take 12.5 mg by mouth daily., Disp: , Rfl:    Cholecalciferol (VITAMIN D) 50 MCG (2000 UT) tablet, Take 2,000 Units by mouth daily., Disp: , Rfl:    ciprofloxacin (CILOXAN) 0.3 % ophthalmic solution, 5 drops to left ear twice daily, Disp: , Rfl:    Cyanocobalamin (B-12) 2500 MCG TABS, Take 2,500 mcg by mouth daily., Disp: , Rfl:    fexofenadine (ALLEGRA ALLERGY) 180 MG tablet, Take 1 tablet (180 mg total) by mouth daily., Disp: 30 tablet, Rfl: 6   finasteride (PROPECIA) 1 MG tablet, finasteride, Disp: , Rfl:    finasteride (PROSCAR) 5 MG tablet, Take 5 mg by mouth daily., Disp: , Rfl:    fluticasone (FLONASE) 50 MCG/ACT nasal spray, Place 1 spray into both nostrils daily., Disp: 16 g, Rfl: 2   fluticasone-salmeterol (ADVAIR) 100-50 MCG/ACT AEPB, Inhale 1 puff into the lungs 2 (two) times daily., Disp: , Rfl:    ipratropium (ATROVENT) 0.03 % nasal spray, Place 2 sprays into both nostrils every 12 (twelve) hours., Disp: 30 mL, Rfl: 12   Multiple Minerals-Vitamins (BONE ESSENTIALS) CAPS, calcium, Disp: , Rfl:    NON FORMULARY, Take 1 tablet by mouth daily. Vision Vite - 1 tablet daily, Disp: , Rfl:    Omega-3 Fatty Acids (RA FISH OIL) 1000 MG CAPS, Fish Oil, Disp: , Rfl:    polyethylene glycol (MIRALAX / GLYCOLAX) packet, Take 17 g by mouth daily as needed for mild constipation. , Disp: , Rfl:    rosuvastatin (CRESTOR) 5 MG tablet, Take 5 mg by mouth daily., Disp: , Rfl:    Wheat Dextrin (BENEFIBER) POWD, Take 10 mLs by mouth daily. 2 tsp Daily, Disp: , Rfl:   Current Facility-Administered Medications:    0.9 %  sodium chloride infusion, 500 mL, Intravenous, Once, Irene Shipper,  MD

## 2021-11-18 ENCOUNTER — Ambulatory Visit (HOSPITAL_COMMUNITY)
Admission: RE | Admit: 2021-11-18 | Discharge: 2021-11-18 | Disposition: A | Discharge status: Home / Self Care | Payer: Medicare Other | Source: Ambulatory Visit | Attending: Radiation Oncology | Admitting: Radiation Oncology

## 2021-11-18 DIAGNOSIS — C3490 Malignant neoplasm of unspecified part of unspecified bronchus or lung: Secondary | ICD-10-CM | POA: Diagnosis not present

## 2021-11-18 DIAGNOSIS — R911 Solitary pulmonary nodule: Secondary | ICD-10-CM | POA: Diagnosis not present

## 2021-11-18 DIAGNOSIS — C3431 Malignant neoplasm of lower lobe, right bronchus or lung: Secondary | ICD-10-CM | POA: Diagnosis not present

## 2021-11-18 LAB — POCT I-STAT CREATININE: Creatinine, Ser: 1.4 mg/dL — ABNORMAL HIGH (ref 0.61–1.24)

## 2021-11-18 MED ORDER — IOHEXOL 300 MG/ML  SOLN
75.0000 mL | Freq: Once | INTRAMUSCULAR | Status: AC | PRN
  Administered 2021-11-18: 75 mL via INTRAVENOUS

## 2021-11-19 ENCOUNTER — Other Ambulatory Visit: Payer: Self-pay | Admitting: Radiation Oncology

## 2021-11-19 ENCOUNTER — Ambulatory Visit
Admission: RE | Admit: 2021-11-19 | Discharge: 2021-11-19 | Disposition: A | Discharge status: Home / Self Care | Payer: Medicare Other | Source: Ambulatory Visit | Attending: Radiation Oncology | Admitting: Radiation Oncology

## 2021-11-19 DIAGNOSIS — C3431 Malignant neoplasm of lower lobe, right bronchus or lung: Secondary | ICD-10-CM

## 2021-11-19 DIAGNOSIS — R911 Solitary pulmonary nodule: Secondary | ICD-10-CM

## 2021-11-19 NOTE — Progress Notes (Signed)
Radiation Oncology         [phone] ________________________________  Outpatient Follow Up - Conducted via telephone at patient request.  I spoke with the patient to conduct this consult visit via telephone. The patient was notified in advance and was offered an in person or telemedicine meeting to allow for face to face communication but instead preferred to proceed with a telephone visit. ________________________________    Name: [name].        MRN: [id number]   Date of Service:11/19/21  DOB: 1925-02-26  YO:[id number], [name], MD  [name], *     REFERRING PHYSICIAN: [name], *  DIAGNOSIS: The primary encounter diagnosis was Nodule of lower lobe of right lung. A diagnosis of Malignant neoplasm of bronchus of right lower lobe [facility]) was also pertinent to this visit.  HISTORY OF PRESENT ILLNESS: Donald Luu. is a 88 y.o.  male with a history of putative stage I lung cancer of the right lower lobe. The patient has a longstanding history of smoking,and is a lifelong smoker.  A chest x-ray at [facility] after a flare of asthma  revealed a nodule in the lung.  He proceeded with CT imaging on 10/23/2019 which revealed a 1.1 x 1.4 x 2.8 cm nodule that was spiculated in the right lower lobe, a PET scan on 11/23/2019 revealed an SUV of 3.2 and the nodule in the right lower lobe measured 1.6 x 1.4 cm, blood pool activity was 2.5.  There was also focal hypermetabolic activity in the right infrahilar region with an SUV max of 4.2 however there was no CT correlate for this.  He was counseled on options of proceeding with biopsy but given his age and comorbidities declined, he did have a repeat CT scan with super D component on 02/19/2020 redemonstrated the lobulated pulmonary nodule in the right lower lobe which abuts the major fissure measuring 1.4 x 1.3 cm. While it was not significantly changed but there was more hypodensity of the internal appearance of  the nodule. He was not interested in surgical resection and rather proceeded with Stereotactic Body Radiotherapy (SBRT). He completed this in February 2022.   Since his treatment, he has been NED with stable emphysematous and atherosclerotic changes. His most recent CT from 11/18/21 showed stability in the RLL with continued changes consistent with his prior radiation, smaller measurements were noted of this area compared to his prior scan in March 2023. He does not have any new nodules, adenopathy, or progressive changes however. He's contacted by phone today.  PREVIOUS RADIATION THERAPY:   03/25/2020 through 04/01/2020 SBRT  Site Technique Total Dose (Gy) Dose per Fx (Gy) Completed Fx Beam Energies  Lung, Right: Lung_Rt IMRT 54/54 18 3/3 6XFFF      PAST MEDICAL HISTORY:  Past Medical History:  Diagnosis Date   Anemia    Arthritis    Cataract    Complication following bilateral mastoidectomy    Dysphagia    with large pills   Gout    HLD (hyperlipidemia)    HTN (hypertension)    Lung nodule 02/19/2020   Melanoma ([place]) 2009   Left forehead   Right posterior capsular opacification 06/21/2019   Wrist fracture    left    PAST SURGICAL HISTORY: Past Surgical History:  Procedure Laterality Date   cataracts     MASTOIDECTOMY REVISION Bilateral years ago   ORIF WRIST FRACTURE Left 1975   QUADRICEPS TENDON REPAIR Right  08/23/2018   Procedure: REPAIR QUADRICEP TENDON;  Surgeon: Gaynelle Arabian, MD;  Location: [facility];  Service: Orthopedics;  Laterality: Right;  67min   TOTAL HIP ARTHROPLASTY Left 05/05/2016   Procedure: LEFT TOTAL HIP ARTHROPLASTY ANTERIOR APPROACH;  Surgeon: Gaynelle Arabian, MD;  Location: [facility];  Service: Orthopedics;  Laterality: Left;    PAST SOCIAL HISTORY:  Social History   Socioeconomic History   Marital status: Widowed    Spouse name: Not on file   Number of children: 2   Years of education: Not on file   Highest education level: Not on file  Occupational  History   Occupation: retired  Tobacco Use   Smoking status: Never   Smokeless tobacco: Never  Vaping Use   Vaping Use: Never used  Substance and Sexual Activity   Alcohol use: No   Drug use: No   Sexual activity: Not on file  Other Topics Concern   Not on file  Social History Narrative   Not on file   Social Determinants of Health   Financial Resource Strain: Not on file  Food Insecurity: Not on file  Transportation Needs: Not on file  Physical Activity: Not on file  Stress: Not on file  Social Connections: Not on file  Intimate Partner Violence: Not on file    PAST FAMILY HISTORY: Family History  Problem Relation Age of Onset   Pneumonia Mother    Heart attack Father    Breast cancer Sister    Heart attack Nephew 66   Heart failure Sister    Kidney Stones Daughter    Esophageal cancer Neg Hx    Colon cancer Neg Hx    Rectal cancer Neg Hx    Stomach cancer Neg Hx     MEDICATIONS  Current Outpatient Medications  Medication Sig Dispense Refill   amLODipine (NORVASC) 2.5 MG tablet Take 2.5 mg by mouth daily.     Amlodipine Besylate POWD amlodipine besylate (bulk)     aspirin EC 81 MG tablet Take 81 mg by mouth daily.     calcium carbonate (TUMS - DOSED IN MG ELEMENTAL CALCIUM) 500 MG chewable tablet Chew 1-2 tablets by mouth as needed for indigestion or heartburn.     Calcium-Vitamin D-Vitamin K (VIACTIV CALCIUM PLUS D) 650-12.5-40 MG-MCG-MCG CHEW Chew 1 each by mouth daily.     Camphor-Eucalyptus-Menthol (CHEST RUB) 4.8-1.2-2.6 % OINT Vicks Vaporub     chlorthalidone (HYGROTON) 25 MG tablet Take 12.5 mg by mouth daily.     Cholecalciferol (VITAMIN D) 50 MCG (2000 UT) tablet Take 2,000 Units by mouth daily.     ciprofloxacin (CILOXAN) 0.3 % ophthalmic solution 5 drops to left ear twice daily     Cyanocobalamin (B-12) 2500 MCG TABS Take 2,500 mcg by mouth daily.     fexofenadine (ALLEGRA ALLERGY) 180 MG tablet Take 1 tablet (180 mg total) by mouth daily. 30 tablet  6   finasteride (PROPECIA) 1 MG tablet finasteride     finasteride (PROSCAR) 5 MG tablet Take 5 mg by mouth daily.     fluticasone (FLONASE) 50 MCG/ACT nasal spray Place 1 spray into both nostrils daily. 16 g 2   fluticasone-salmeterol (ADVAIR) 100-50 MCG/ACT AEPB Inhale 1 puff into the lungs 2 (two) times daily.     ipratropium (ATROVENT) 0.03 % nasal spray Place 2 sprays into both nostrils every 12 (twelve) hours. 30 mL 12   Multiple Minerals-Vitamins (BONE ESSENTIALS) CAPS calcium     NON FORMULARY Take 1 tablet by  mouth daily. Vision Vite - 1 tablet daily     Omega-3 Fatty Acids (RA FISH OIL) 1000 MG CAPS Fish Oil     polyethylene glycol (MIRALAX / GLYCOLAX) packet Take 17 g by mouth daily as needed for mild constipation.      rosuvastatin (CRESTOR) 5 MG tablet Take 5 mg by mouth daily.     Wheat Dextrin (BENEFIBER) POWD Take 10 mLs by mouth daily. 2 tsp Daily     Current Facility-Administered Medications  Medication Dose Route Frequency Provider Last Rate Last Admin   0.9 %  sodium chloride infusion  500 mL Intravenous Once Irene Shipper, MD        ALLERGIES:  Allergies  Allergen Reactions   Other Other (See Comments)    Pt can not take gel caps - med gets stuck in throat        REVIEW OF SYSTEMS: On review of systems, the patient reports that he is doing pretty well overall. He has hoarseness and feels this was related to his Advair inhaler which he has discontinued. He follows with Dr. Erin Fulling in pulmonary medicine. He feels like his breathing is improved actually with the cooler weather. He denies any new cough or hemoptysis. No other complaints are verbalized.        PHYSICAL EXAM:  Unable to assess due to encounter type.    ECOG = 1 0 - Asymptomatic (Fully active, able to carry on all predisease activities without restriction) 1 - Symptomatic but completely ambulatory (Restricted in physically strenuous activity but ambulatory and able to carry out work of a light or  sedentary nature. For example, light housework, office work) 2 - Symptomatic, <50% in bed during the day (Ambulatory and capable of all self care but unable to carry out any work activities. Up and about more than 50% of waking hours) 3 - Symptomatic, >50% in bed, but not bedbound (Capable of only limited self-care, confined to bed or chair 50% or more of waking hours) 4 - Bedbound (Completely disabled. Cannot carry on any self-care. Totally confined to bed or chair) 5 - Death   Eustace Pen MM, Creech RH, Tormey DC, et al. [phone]). "Toxicity and response criteria of the [facility]". [place] Oncol. 5 (6): 649-55      IMPRESSION/PLAN: 1.         Putative Stage IA2, cT1bN0M0 NSCLC of the RLL.  The patient's imaging is reassuring again at this interval and given good performance status he desires continued aggressive surveillance. We will plan for CT chest with contrast in 6 months time. He's in agreement with this plan and will contact us sooner if he has questions or concerns.  2. Hoarseness. I encouraged to report progressive symptoms to his PCP or pulmonary team. He does not feel like he's had any plaques in his mouth, but would let us know as well if he has them after brushing his teeth today.  This encounter was conducted via telephone.  The patient has provided two factor identification and has given verbal consent for this type of encounter and has been advised to only accept a meeting of this type in a secure network environment. The time spent during this encounter was 35 minutes including preparation, discussion, and coordination of the patient's care. The attendants for this meeting include   [name]  and [name]..  During the encounter,  [name] was located at [place]  Department.  [name]. was located at home.      Carola Rhine, PAC

## 2021-11-23 ENCOUNTER — Ambulatory Visit: Payer: Self-pay | Admitting: Radiation Oncology

## 2021-11-30 ENCOUNTER — Ambulatory Visit: Payer: Medicare Other | Admitting: Radiation Oncology

## 2021-12-10 DIAGNOSIS — Z23 Encounter for immunization: Secondary | ICD-10-CM | POA: Diagnosis not present

## 2022-01-04 DIAGNOSIS — L814 Other melanin hyperpigmentation: Secondary | ICD-10-CM | POA: Diagnosis not present

## 2022-01-04 DIAGNOSIS — L821 Other seborrheic keratosis: Secondary | ICD-10-CM | POA: Diagnosis not present

## 2022-01-04 DIAGNOSIS — D1801 Hemangioma of skin and subcutaneous tissue: Secondary | ICD-10-CM | POA: Diagnosis not present

## 2022-01-04 DIAGNOSIS — L565 Disseminated superficial actinic porokeratosis (DSAP): Secondary | ICD-10-CM | POA: Diagnosis not present

## 2022-01-04 DIAGNOSIS — D2271 Melanocytic nevi of right lower limb, including hip: Secondary | ICD-10-CM | POA: Diagnosis not present

## 2022-01-04 DIAGNOSIS — L570 Actinic keratosis: Secondary | ICD-10-CM | POA: Diagnosis not present

## 2022-01-04 DIAGNOSIS — Z85828 Personal history of other malignant neoplasm of skin: Secondary | ICD-10-CM | POA: Diagnosis not present

## 2022-01-04 DIAGNOSIS — D2261 Melanocytic nevi of right upper limb, including shoulder: Secondary | ICD-10-CM | POA: Diagnosis not present

## 2022-01-04 DIAGNOSIS — D2262 Melanocytic nevi of left upper limb, including shoulder: Secondary | ICD-10-CM | POA: Diagnosis not present

## 2022-01-04 DIAGNOSIS — D225 Melanocytic nevi of trunk: Secondary | ICD-10-CM | POA: Diagnosis not present

## 2022-03-08 DIAGNOSIS — Z125 Encounter for screening for malignant neoplasm of prostate: Secondary | ICD-10-CM | POA: Diagnosis not present

## 2022-03-08 DIAGNOSIS — R946 Abnormal results of thyroid function studies: Secondary | ICD-10-CM | POA: Diagnosis not present

## 2022-03-08 DIAGNOSIS — I10 Essential (primary) hypertension: Secondary | ICD-10-CM | POA: Diagnosis not present

## 2022-03-08 DIAGNOSIS — R7989 Other specified abnormal findings of blood chemistry: Secondary | ICD-10-CM | POA: Diagnosis not present

## 2022-03-08 DIAGNOSIS — E785 Hyperlipidemia, unspecified: Secondary | ICD-10-CM | POA: Diagnosis not present

## 2022-03-08 DIAGNOSIS — N1831 Chronic kidney disease, stage 3a: Secondary | ICD-10-CM | POA: Diagnosis not present

## 2022-03-08 DIAGNOSIS — R7301 Impaired fasting glucose: Secondary | ICD-10-CM | POA: Diagnosis not present

## 2022-03-10 DIAGNOSIS — I10 Essential (primary) hypertension: Secondary | ICD-10-CM | POA: Diagnosis not present

## 2022-03-10 DIAGNOSIS — R82998 Other abnormal findings in urine: Secondary | ICD-10-CM | POA: Diagnosis not present

## 2022-03-15 DIAGNOSIS — N1831 Chronic kidney disease, stage 3a: Secondary | ICD-10-CM | POA: Diagnosis not present

## 2022-03-15 DIAGNOSIS — N183 Chronic kidney disease, stage 3 unspecified: Secondary | ICD-10-CM | POA: Diagnosis not present

## 2022-03-15 DIAGNOSIS — D472 Monoclonal gammopathy: Secondary | ICD-10-CM | POA: Diagnosis not present

## 2022-03-15 DIAGNOSIS — C3490 Malignant neoplasm of unspecified part of unspecified bronchus or lung: Secondary | ICD-10-CM | POA: Diagnosis not present

## 2022-03-15 DIAGNOSIS — H6122 Impacted cerumen, left ear: Secondary | ICD-10-CM | POA: Diagnosis not present

## 2022-03-15 DIAGNOSIS — I129 Hypertensive chronic kidney disease with stage 1 through stage 4 chronic kidney disease, or unspecified chronic kidney disease: Secondary | ICD-10-CM | POA: Diagnosis not present

## 2022-03-15 DIAGNOSIS — Z1339 Encounter for screening examination for other mental health and behavioral disorders: Secondary | ICD-10-CM | POA: Diagnosis not present

## 2022-03-15 DIAGNOSIS — R911 Solitary pulmonary nodule: Secondary | ICD-10-CM | POA: Diagnosis not present

## 2022-03-15 DIAGNOSIS — Z1331 Encounter for screening for depression: Secondary | ICD-10-CM | POA: Diagnosis not present

## 2022-03-15 DIAGNOSIS — Z0000 Encounter for general adult medical examination without abnormal findings: Secondary | ICD-10-CM | POA: Diagnosis not present

## 2022-04-01 DIAGNOSIS — Z974 Presence of external hearing-aid: Secondary | ICD-10-CM | POA: Diagnosis not present

## 2022-04-01 DIAGNOSIS — H6123 Impacted cerumen, bilateral: Secondary | ICD-10-CM | POA: Diagnosis not present

## 2022-05-20 ENCOUNTER — Ambulatory Visit (HOSPITAL_COMMUNITY)
Admission: RE | Admit: 2022-05-20 | Discharge: 2022-05-20 | Disposition: A | Discharge status: Home / Self Care | Payer: Medicare Other | Source: Ambulatory Visit | Attending: Radiation Oncology | Admitting: Radiation Oncology

## 2022-05-20 DIAGNOSIS — R918 Other nonspecific abnormal finding of lung field: Secondary | ICD-10-CM | POA: Diagnosis not present

## 2022-05-20 DIAGNOSIS — C3490 Malignant neoplasm of unspecified part of unspecified bronchus or lung: Secondary | ICD-10-CM | POA: Diagnosis not present

## 2022-05-20 DIAGNOSIS — C3431 Malignant neoplasm of lower lobe, right bronchus or lung: Secondary | ICD-10-CM

## 2022-05-20 LAB — POCT I-STAT CREATININE: Creatinine, Ser: 1.5 mg/dL — ABNORMAL HIGH (ref 0.61–1.24)

## 2022-05-20 MED ORDER — IOHEXOL 300 MG/ML  SOLN
75.0000 mL | Freq: Once | INTRAMUSCULAR | Status: AC | PRN
  Administered 2022-05-20: 60 mL via INTRAVENOUS

## 2022-05-20 MED ORDER — SODIUM CHLORIDE (PF) 0.9 % IJ SOLN
INTRAMUSCULAR | Status: AC
  Filled 2022-05-20: qty 50

## 2022-05-24 ENCOUNTER — Ambulatory Visit: Payer: Medicare Other | Admitting: Radiation Oncology

## 2022-05-28 ENCOUNTER — Encounter: Payer: Self-pay | Admitting: Radiation Oncology

## 2022-05-28 NOTE — Progress Notes (Signed)
Telephone nursing appointment for patient to receive most recent scan results from 05/20/22. I verified patient's identity and began nursing interview. Patient reports doing well.   Meaningful use complete.   Patient aware of their 1:00pm-05/31/2022 telephone appointment w/ [name] PA-C. I left my extension [phone] in case patient needs anything. Patient verbalized understanding. This concludes the nursing interview.   Patient contact [phone]     Leandra Kern, LPN

## 2022-05-31 ENCOUNTER — Ambulatory Visit
Admission: RE | Admit: 2022-05-31 | Discharge: 2022-05-31 | Disposition: A | Discharge status: Home / Self Care | Payer: Medicare Other | Source: Ambulatory Visit | Attending: Radiation Oncology | Admitting: Radiation Oncology

## 2022-05-31 DIAGNOSIS — C3431 Malignant neoplasm of lower lobe, right bronchus or lung: Secondary | ICD-10-CM

## 2022-05-31 NOTE — Progress Notes (Signed)
Radiation Oncology         [phone] ________________________________  Outpatient Follow Up - Conducted via telephone at patient request.  I spoke with the patient to conduct this consult visit via telephone. The patient was notified in advance and was offered an in person or telemedicine meeting to allow for face to face communication but instead preferred to proceed with a telephone visit. ________________________________    Name: [name].        MRN: [id number]   Date of Service:05/31/22  DOB: 04-23-1924  [id number], [name], MD  [name], *     REFERRING PHYSICIAN: [name], *  DIAGNOSIS: The primary encounter diagnosis was Nodule of lower lobe of right lung. A diagnosis of Malignant neoplasm of bronchus of right lower lobe [facility]) was also pertinent to this visit.  HISTORY OF PRESENT ILLNESS: [name]. is a 89 y.o.  male with a history of putative stage I lung cancer of the right lower lobe. The patient has a longstanding history of smoking,and is a lifelong smoker.  A chest x-ray at [facility] after a flare of asthma  revealed a nodule in the lung.  He proceeded with CT imaging on 10/23/2019 which revealed a 1.1 x 1.4 x 2.8 cm nodule that was spiculated in the right lower lobe, a PET scan on 11/23/2019 revealed an SUV of 3.2 and the nodule in the right lower lobe measured 1.6 x 1.4 cm, blood pool activity was 2.5.  There was also focal hypermetabolic activity in the right infrahilar region with an SUV max of 4.2 however there was no CT correlate for this.  He was counseled on options of proceeding with biopsy but given his age and comorbidities declined, he did have a repeat CT scan with super D component on 02/19/2020 redemonstrated the lobulated pulmonary nodule in the right lower lobe which abuts the major fissure measuring 1.4 x 1.3 cm. While it was not significantly changed but there was more hypodensity of the internal appearance of  the nodule. He was not interested in surgical resection and rather proceeded with Stereotactic Body Radiotherapy (SBRT). He completed this in February 2022.   Since his treatment, he has been NED with stable emphysematous and atherosclerotic changes. He had a surveillance CT scan on 05/20/22 that showed similar post treatment radiation changes in the RLL. There is also a stable tiny nodule in the RLL measuring 3 mm, and another stable 3 mm nodule in the RLL more centrally. He's contacted by phone today to review these results.    PREVIOUS RADIATION THERAPY:   03/25/2020 through 04/01/2020  SBRT Treatment Site Technique Total Dose (Gy) Dose per Fx (Gy) Completed Fx Beam Energies  Lung, Right: Lung_Rt IMRT 54/54 18 3/3 6XFFF      PAST MEDICAL HISTORY:  Past Medical History:  Diagnosis Date   Anemia    Arthritis    Cataract    Complication following bilateral mastoidectomy    Dysphagia    with large pills   Gout    HLD (hyperlipidemia)    HTN (hypertension)    Lung nodule 02/19/2020   Melanoma ([place]) 2009   Left forehead   Right posterior capsular opacification 06/21/2019   Wrist fracture    left    PAST SURGICAL HISTORY: Past Surgical History:  Procedure Laterality Date   cataracts     MASTOIDECTOMY REVISION Bilateral years ago   ORIF WRIST FRACTURE Left 1975   QUADRICEPS  TENDON REPAIR Right 08/23/2018   Procedure: REPAIR QUADRICEP TENDON;  Surgeon: Gaynelle Arabian, MD;  Location: [facility];  Service: Orthopedics;  Laterality: Right;  45min   TOTAL HIP ARTHROPLASTY Left 05/05/2016   Procedure: LEFT TOTAL HIP ARTHROPLASTY ANTERIOR APPROACH;  Surgeon: Gaynelle Arabian, MD;  Location: [facility];  Service: Orthopedics;  Laterality: Left;    PAST SOCIAL HISTORY:  Social History   Socioeconomic History   Marital status: Widowed    Spouse name: Not on file   Number of children: 2   Years of education: Not on file   Highest education level: Not on file  Occupational History   Occupation:  retired  Tobacco Use   Smoking status: Never   Smokeless tobacco: Never  Vaping Use   Vaping Use: Never used  Substance and Sexual Activity   Alcohol use: No   Drug use: No   Sexual activity: Not on file  Other Topics Concern   Not on file  Social History Narrative   Not on file   Social Determinants of Health   Financial Resource Strain: Not on file  Food Insecurity: No Food Insecurity (05/28/2022)   Hunger Vital Sign    Worried About Running Out of Food in the Last Year: Never true    Ran Out of Food in the Last Year: Never true  Transportation Needs: No Transportation Needs (05/28/2022)   PRAPARE - Hydrologist (Medical): No    Lack of Transportation (Non-Medical): No  Physical Activity: Not on file  Stress: Not on file  Social Connections: Not on file  Intimate Partner Violence: Not At Risk (05/28/2022)   Humiliation, Afraid, Rape, and Kick questionnaire    Fear of Current or Ex-Partner: No    Emotionally Abused: No    Physically Abused: No    Sexually Abused: No    PAST FAMILY HISTORY: Family History  Problem Relation Age of Onset   Pneumonia Mother    Heart attack Father    Breast cancer Sister    Heart attack Nephew 66   Heart failure Sister    Kidney Stones Daughter    Esophageal cancer Neg Hx    Colon cancer Neg Hx    Rectal cancer Neg Hx    Stomach cancer Neg Hx     MEDICATIONS  Current Outpatient Medications  Medication Sig Dispense Refill   amLODipine (NORVASC) 2.5 MG tablet Take 2.5 mg by mouth daily.     Amlodipine Besylate POWD amlodipine besylate (bulk)     aspirin EC 81 MG tablet Take 81 mg by mouth daily.     calcium carbonate (TUMS - DOSED IN MG ELEMENTAL CALCIUM) 500 MG chewable tablet Chew 1-2 tablets by mouth as needed for indigestion or heartburn.     Calcium-Vitamin D-Vitamin K (VIACTIV CALCIUM PLUS D) 650-12.5-40 MG-MCG-MCG CHEW Chew 1 each by mouth daily.     Camphor-Eucalyptus-Menthol (CHEST RUB)  4.8-1.2-2.6 % OINT Vicks Vaporub     chlorthalidone (HYGROTON) 25 MG tablet Take 12.5 mg by mouth daily.     Cholecalciferol (VITAMIN D) 50 MCG (2000 UT) tablet Take 2,000 Units by mouth daily.     ciprofloxacin (CILOXAN) 0.3 % ophthalmic solution 5 drops to left ear twice daily     Cyanocobalamin (B-12) 2500 MCG TABS Take 2,500 mcg by mouth daily.     fexofenadine (ALLEGRA ALLERGY) 180 MG tablet Take 1 tablet (180 mg total) by mouth daily. 30 tablet 6   finasteride (PROPECIA) 1 MG  tablet finasteride     finasteride (PROSCAR) 5 MG tablet Take 5 mg by mouth daily.     fluticasone (FLONASE) 50 MCG/ACT nasal spray Place 1 spray into both nostrils daily. 16 g 2   fluticasone-salmeterol (ADVAIR) 100-50 MCG/ACT AEPB Inhale 1 puff into the lungs 2 (two) times daily.     ipratropium (ATROVENT) 0.03 % nasal spray Place 2 sprays into both nostrils every 12 (twelve) hours. 30 mL 12   Multiple Minerals-Vitamins (BONE ESSENTIALS) CAPS calcium     NON FORMULARY Take 1 tablet by mouth daily. Vision Vite - 1 tablet daily     Omega-3 Fatty Acids (RA FISH OIL) 1000 MG CAPS Fish Oil     polyethylene glycol (MIRALAX / GLYCOLAX) packet Take 17 g by mouth daily as needed for mild constipation.      rosuvastatin (CRESTOR) 5 MG tablet Take 5 mg by mouth daily.     Wheat Dextrin (BENEFIBER) POWD Take 10 mLs by mouth daily. 2 tsp Daily     Current Facility-Administered Medications  Medication Dose Route Frequency Provider Last Rate Last Admin   0.9 %  sodium chloride infusion  500 mL Intravenous Once Irene Shipper, MD        ALLERGIES:  Allergies  Allergen Reactions   Other Other (See Comments)    Pt can not take gel caps - med gets stuck in throat        REVIEW OF SYSTEMS: On review of systems, the patient reports he is doing well and feels like he is not having as much trouble with hoarseness as he did previously. He reports he is using his rescue inhaler 3 times a month on average. He denies any shortness  of breath or chest pain currently. No other complaints are verbalized.       PHYSICAL EXAM:  Unable to assess due to encounter type.    ECOG = 0 0 - Asymptomatic (Fully active, able to carry on all predisease activities without restriction) 1 - Symptomatic but completely ambulatory (Restricted in physically strenuous activity but ambulatory and able to carry out work of a light or sedentary nature. For example, light housework, office work) 2 - Symptomatic, <50% in bed during the day (Ambulatory and capable of all self care but unable to carry out any work activities. Up and about more than 50% of waking hours) 3 - Symptomatic, >50% in bed, but not bedbound (Capable of only limited self-care, confined to bed or chair 50% or more of waking hours) 4 - Bedbound (Completely disabled. Cannot carry on any self-care. Totally confined to bed or chair) 5 - Death   Eustace Pen MM, Creech RH, Tormey DC, et al. [phone]). "Toxicity and response criteria of the [facility]". [place] Oncol. 5 (6): 649-55      IMPRESSION/PLAN: 1.         Putative Stage IA2, cT1bN0M0 NSCLC of the RLL. We reviewed his CT scan together and discussed the favorable findings. He continues to do well clinically and remains independent. As a result, we discussed that it is reasonable to continue aggressive surveillance with routine CT scans. We will plan for CT chest with contrast in 6 months time. He's in agreement with this plan and will contact us sooner if he has questions or concerns.  2. Hoarseness. This has improved and he feels this is related to managing respiratory secretions related to his asthma. We will follow this expectantly.  This encounter was conducted via telephone.  The patient has provided two factor identification and has given verbal consent for this type of encounter and has been advised to only accept a meeting of this type in a secure network environment. The time spent during this  encounter was 35 minutes including preparation, discussion, and coordination of the patient's care. The attendants for this meeting include   Hayden Pedro  and [name].. During the encounter,  Hayden Pedro was located at [facility] Radiation Oncology Department.  [name]. was located at home.      Carola Rhine, PAC

## 2022-06-01 ENCOUNTER — Encounter (INDEPENDENT_AMBULATORY_CARE_PROVIDER_SITE_OTHER): Payer: Medicare Other | Admitting: Ophthalmology

## 2022-06-08 DIAGNOSIS — H401123 Primary open-angle glaucoma, left eye, severe stage: Secondary | ICD-10-CM | POA: Diagnosis not present

## 2022-06-08 DIAGNOSIS — H353112 Nonexudative age-related macular degeneration, right eye, intermediate dry stage: Secondary | ICD-10-CM | POA: Diagnosis not present

## 2022-06-08 DIAGNOSIS — L820 Inflamed seborrheic keratosis: Secondary | ICD-10-CM | POA: Diagnosis not present

## 2022-06-08 DIAGNOSIS — Z85828 Personal history of other malignant neoplasm of skin: Secondary | ICD-10-CM | POA: Diagnosis not present

## 2022-06-08 DIAGNOSIS — H35373 Puckering of macula, bilateral: Secondary | ICD-10-CM | POA: Diagnosis not present

## 2022-06-10 ENCOUNTER — Other Ambulatory Visit: Payer: Self-pay | Admitting: Pulmonary Disease

## 2022-06-10 DIAGNOSIS — R0982 Postnasal drip: Secondary | ICD-10-CM

## 2022-06-10 DIAGNOSIS — R0981 Nasal congestion: Secondary | ICD-10-CM

## 2022-07-02 ENCOUNTER — Encounter: Payer: Self-pay | Admitting: Pulmonary Disease

## 2022-07-02 ENCOUNTER — Ambulatory Visit (INDEPENDENT_AMBULATORY_CARE_PROVIDER_SITE_OTHER): Payer: Medicare Other | Admitting: Pulmonary Disease

## 2022-07-02 VITALS — BP 116/68 | HR 71 | Ht 67.0 in | Wt 128.0 lb

## 2022-07-02 DIAGNOSIS — J4520 Mild intermittent asthma, uncomplicated: Secondary | ICD-10-CM

## 2022-07-02 DIAGNOSIS — K219 Gastro-esophageal reflux disease without esophagitis: Secondary | ICD-10-CM | POA: Diagnosis not present

## 2022-07-02 DIAGNOSIS — R0981 Nasal congestion: Secondary | ICD-10-CM

## 2022-07-02 DIAGNOSIS — R0982 Postnasal drip: Secondary | ICD-10-CM

## 2022-07-02 MED ORDER — FAMOTIDINE 20 MG PO TABS: 20.0000 mg | ORAL_TABLET | Freq: Every day | ORAL | 5 refills | Status: DC

## 2022-07-02 NOTE — Progress Notes (Signed)
Synopsis: Referred in July 2023 for Asthma/COPD by [name], MD  Subjective:   PATIENT ID: [name]. GENDER: male DOB: 05-27-1924, MRN: [id number]  HPI  Chief Complaint  Patient presents with   Follow-up    F/U on asthma.    [name] is a 89 year old male, never smoker with hypertension, hyperlipidemia, melanoma and RLL stage I lung cancer s/p SBRT 2022 who returns to pulmonary clinic asthma/COPD.  He has noticed decrease in congestion/mucous with fluticasone/ipratropium nasal sprays and fexofenadine allergy medicine. He is using advair diskus 100-78mcg 1 puff twice daily.   He does have intermittent reflux.   OV 10/27/21 He has been using advair diskus 100-64mcg 1 puff twice daily. He was started on fluticasone and ipratropium nasal sprays at last visit. He reports his wheezing and cough are much better. He continues to have some cough with mucous congestion in his throat.   Initial OV 09/23/21 He reports increased wheezing and cough with mucous production in the morning times over the past year. He has constant sinus congestion and post-nasal drainage where he feels like he always has phlegm in his throat. He denies issues with dyspnea as he is able to complete all of his house work. He does have mild reflux disease that is managed with as needed TUMS. He denies seasonal allergies. He denies issues with his breathing related to weather changes. He reports childhood asthma. He may wake up occasionally coughing up mucous.  He reports using either symbicort or advair diskus inhaler for his morning symptoms which helps.   He is retired and widowed. 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    Past Medical History:  Diagnosis Date   Anemia    Arthritis    Cataract    Complication following bilateral mastoidectomy    Dysphagia    with large pills   Gout    HLD (hyperlipidemia)    HTN (hypertension)    Lung nodule 02/19/2020   Melanoma ([facility])  2009   Left forehead   Right posterior capsular opacification 06/21/2019   Wrist fracture    left     Family History  Problem Relation Age of Onset   Pneumonia Mother    Heart attack Father    Breast cancer Sister    Heart attack Nephew 66   Heart failure Sister    Kidney Stones Daughter    Esophageal cancer Neg Hx    Colon cancer Neg Hx    Rectal cancer Neg Hx    Stomach cancer Neg Hx      Social History   Socioeconomic History   Marital status: Widowed    Spouse name: Not on file   Number of children: 2   Years of education: Not on file   Highest education level: Not on file  Occupational History   Occupation: retired  Tobacco Use   Smoking status: Never   Smokeless tobacco: Never  Vaping Use   Vaping Use: Never used  Substance and Sexual Activity   Alcohol use: No   Drug use: No   Sexual activity: Not on file  Other Topics Concern   Not on file  Social History Narrative   Not on file   Social Determinants of Health   Financial Resource Strain: Not on file  Food Insecurity: No Food Insecurity (05/28/2022)   Hunger Vital Sign    Worried About Running Out of Food in the Last Year: Never true  Ran Out of Food in the Last Year: Never true  Transportation Needs: No Transportation Needs (05/28/2022)   PRAPARE - Administrator, Civil Service (Medical): No    Lack of Transportation (Non-Medical): No  Physical Activity: Not on file  Stress: Not on file  Social Connections: Not on file  Intimate Partner Violence: Not At Risk (05/28/2022)   Humiliation, Afraid, Rape, and Kick questionnaire    Fear of Current or Ex-Partner: No    Emotionally Abused: No    Physically Abused: No    Sexually Abused: No     Allergies  Allergen Reactions   Other Other (See Comments)    Pt can not take gel caps - med gets stuck in throat      Outpatient Medications Prior to Visit  Medication Sig Dispense Refill   amLODipine (NORVASC) 2.5 MG tablet Take 2.5 mg by  mouth daily.     Amlodipine Besylate POWD amlodipine besylate (bulk)     aspirin EC 81 MG tablet Take 81 mg by mouth daily.     calcium carbonate (TUMS - DOSED IN MG ELEMENTAL CALCIUM) 500 MG chewable tablet Chew 1-2 tablets by mouth as needed for indigestion or heartburn.     Calcium-Vitamin D-Vitamin K (VIACTIV CALCIUM PLUS D) 650-12.5-40 MG-MCG-MCG CHEW Chew 1 each by mouth daily.     Camphor-Eucalyptus-Menthol (CHEST RUB) 4.8-1.2-2.6 % OINT Vicks Vaporub     chlorthalidone (HYGROTON) 25 MG tablet Take 12.5 mg by mouth daily.     Cholecalciferol (VITAMIN D) 50 MCG (2000 UT) tablet Take 2,000 Units by mouth daily.     Cyanocobalamin (B-12) 2500 MCG TABS Take 2,500 mcg by mouth daily.     fexofenadine (ALLEGRA ALLERGY) 180 MG tablet Take 1 tablet (180 mg total) by mouth daily. 30 tablet 6   finasteride (PROSCAR) 5 MG tablet Take 5 mg by mouth daily.     fluticasone (FLONASE) 50 MCG/ACT nasal spray INSTILL 1 SPRAY INTO EACH NOSTRIL ONCE DAILY AS DIRECTED 16 mL 0   fluticasone-salmeterol (ADVAIR) 100-50 MCG/ACT AEPB Inhale 1 puff into the lungs 2 (two) times daily.     ipratropium (ATROVENT) 0.03 % nasal spray Place 2 sprays into both nostrils every 12 (twelve) hours. 30 mL 12   Multiple Minerals-Vitamins (BONE ESSENTIALS) CAPS calcium     NON FORMULARY Take 1 tablet by mouth daily. Vision Vite - 1 tablet daily     Omega-3 Fatty Acids (RA FISH OIL) 1000 MG CAPS Fish Oil     polyethylene glycol (MIRALAX / GLYCOLAX) packet Take 17 g by mouth daily as needed for mild constipation.      rosuvastatin (CRESTOR) 5 MG tablet Take 5 mg by mouth daily.     Wheat Dextrin (BENEFIBER) POWD Take 10 mLs by mouth daily. 2 tsp Daily     ciprofloxacin (CILOXAN) 0.3 % ophthalmic solution 5 drops to left ear twice daily     finasteride (PROPECIA) 1 MG tablet finasteride     Facility-Administered Medications Prior to Visit  Medication Dose Route Frequency Provider Last Rate Last Admin   0.9 %  sodium chloride  infusion  500 mL Intravenous Once Hilarie Fredrickson, MD       Review of Systems  Constitutional:  Negative for chills, fever, malaise/fatigue and weight loss.  HENT:  Negative for congestion, sinus pain and sore throat.   Eyes: Negative.   Respiratory:  Positive for cough and sputum production. Negative for hemoptysis, shortness of breath and wheezing.  Cardiovascular:  Negative for chest pain, palpitations, orthopnea, claudication and leg swelling.  Gastrointestinal:  Positive for heartburn. Negative for abdominal pain, nausea and vomiting.  Genitourinary: Negative.   Skin:  Negative for rash.  Neurological:  Negative for weakness.   Objective:   Vitals:   07/02/22 0924  BP: 116/68  Pulse: 71  SpO2: 97%  Weight: 128 lb (58.1 kg)  Height: 5\' 7"  (1.702 m)    Physical Exam Constitutional:      General: He is not in acute distress. HENT:     Head: Normocephalic and atraumatic.  Cardiovascular:     Rate and Rhythm: Normal rate and regular rhythm.     Pulses: Normal pulses.     Heart sounds: Normal heart sounds. No murmur heard. Pulmonary:     Effort: Pulmonary effort is normal.     Breath sounds: Normal breath sounds.  Musculoskeletal:     Right lower leg: No edema.     Left lower leg: No edema.  Skin:    General: Skin is warm and dry.  Neurological:     General: No focal deficit present.     Mental Status: He is alert.    CBC    Component Value Date/Time   WBC 5.3 08/21/2018 1342   RBC 3.44 (L) 08/21/2018 1342   HGB 11.2 (L) 08/21/2018 1342   HGB 11.9 (L) 09/29/2016 0801   HCT 35.0 (L) 08/21/2018 1342   HCT 35.5 (L) 09/29/2016 0801   PLT 236 08/21/2018 1342   PLT 175 09/29/2016 0801   MCV 101.7 (H) 08/21/2018 1342   MCV 96.2 09/29/2016 0801   MCH 32.6 08/21/2018 1342   MCHC 32.0 08/21/2018 1342   RDW 12.7 08/21/2018 1342   RDW 13.4 09/29/2016 0801   LYMPHSABS 1.2 09/29/2016 0801   MONOABS 0.3 09/29/2016 0801   EOSABS 0.3 09/29/2016 0801   BASOSABS 0.0  09/29/2016 0801      Latest Ref Rng & Units 05/20/2022    2:45 PM 11/18/2021   12:12 PM 05/15/2021    4:36 PM  BMP  Creatinine 0.61 - 1.24 mg/dL 1.61  0.96  0.45    Chest imaging: CT Chest w contrast 05/15/21 1. Treated right lower lobe pulmonary nodule is grossly stable compared to the prior study with surrounding postradiation changes, as above. No signs of new metastatic disease are noted in the thorax. 2. Mild diffuse bronchial wall thickening with very mild centrilobular and paraseptal emphysema; imaging findings suggestive of underlying COPD. 3. Aortic atherosclerosis, in addition to left main and three-vessel coronary artery disease. 4. There are calcifications of the aortic valve and mitral annulus. Echocardiographic correlation for evaluation of potential valvular dysfunction may be warranted if clinically indicated. 5. Mild cardiomegaly.  PFT:     No data to display          Labs:  Path:  Echo:  Heart Catheterization:  Assessment & Plan:   Mild intermittent asthma without complication  Sinus congestion  Post-nasal drip  Gastroesophageal reflux disease without esophagitis - Plan: famotidine (PEPCID) 20 MG tablet  Discussion: [name] is a 89 year old male, never smoker with hypertension, hyperlipidemia, melanoma and RLL stage I lung cancer s/p SBRT 2022 who returns to pulmonary clinic asthma/COPD.  He is to continue advair diskus 100-50mcg 1 puff twice daily along with fluticasone nasal spray daily and ipratropium nasal spray twice daily. He is to continue fexofenadine 180mg  daily for allergies. We will add 20mg  famotidine at bed time for  on going cough and mucous congestion for GERD.   Follow up in 6 months.  Melody Comas, MD [facility] Office: [phone]   Current Outpatient Medications:    amLODipine (NORVASC) 2.5 MG tablet, Take 2.5 mg by mouth daily., Disp: , Rfl:    Amlodipine Besylate POWD, amlodipine  besylate (bulk), Disp: , Rfl:    aspirin EC 81 MG tablet, Take 81 mg by mouth daily., Disp: , Rfl:    calcium carbonate (TUMS - DOSED IN MG ELEMENTAL CALCIUM) 500 MG chewable tablet, Chew 1-2 tablets by mouth as needed for indigestion or heartburn., Disp: , Rfl:    Calcium-Vitamin D-Vitamin K (VIACTIV CALCIUM PLUS D) 650-12.5-40 MG-MCG-MCG CHEW, Chew 1 each by mouth daily., Disp: , Rfl:    Camphor-Eucalyptus-Menthol (CHEST RUB) 4.8-1.2-2.6 % OINT, Vicks Vaporub, Disp: , Rfl:    chlorthalidone (HYGROTON) 25 MG tablet, Take 12.5 mg by mouth daily., Disp: , Rfl:    Cholecalciferol (VITAMIN D) 50 MCG (2000 UT) tablet, Take 2,000 Units by mouth daily., Disp: , Rfl:    Cyanocobalamin (B-12) 2500 MCG TABS, Take 2,500 mcg by mouth daily., Disp: , Rfl:    famotidine (PEPCID) 20 MG tablet, Take 1 tablet (20 mg total) by mouth at bedtime., Disp: 30 tablet, Rfl: 5   fexofenadine (ALLEGRA ALLERGY) 180 MG tablet, Take 1 tablet (180 mg total) by mouth daily., Disp: 30 tablet, Rfl: 6   finasteride (PROSCAR) 5 MG tablet, Take 5 mg by mouth daily., Disp: , Rfl:    fluticasone (FLONASE) 50 MCG/ACT nasal spray, INSTILL 1 SPRAY INTO EACH NOSTRIL ONCE DAILY AS DIRECTED, Disp: 16 mL, Rfl: 0   fluticasone-salmeterol (ADVAIR) 100-50 MCG/ACT AEPB, Inhale 1 puff into the lungs 2 (two) times daily., Disp: , Rfl:    ipratropium (ATROVENT) 0.03 % nasal spray, Place 2 sprays into both nostrils every 12 (twelve) hours., Disp: 30 mL, Rfl: 12   Multiple Minerals-Vitamins (BONE ESSENTIALS) CAPS, calcium, Disp: , Rfl:    NON FORMULARY, Take 1 tablet by mouth daily. Vision Vite - 1 tablet daily, Disp: , Rfl:    Omega-3 Fatty Acids (RA FISH OIL) 1000 MG CAPS, Fish Oil, Disp: , Rfl:    polyethylene glycol (MIRALAX / GLYCOLAX) packet, Take 17 g by mouth daily as needed for mild constipation. , Disp: , Rfl:    rosuvastatin (CRESTOR) 5 MG tablet, Take 5 mg by mouth daily., Disp: , Rfl:    Wheat Dextrin (BENEFIBER) POWD, Take 10 mLs by  mouth daily. 2 tsp Daily, Disp: , Rfl:   Current Facility-Administered Medications:    0.9 %  sodium chloride infusion, 500 mL, Intravenous, Once, Hilarie Fredrickson, MD

## 2022-07-02 NOTE — Patient Instructions (Addendum)
Use advair inhaler 1 puff twice daily - rinse mouth out after each use   Use fluticasone nasal spray, 1 spray per nostril daily   Use iptropium nasal spray, 2 sprays per nostril twice daily   Continue Allegra (fexofenidine) daily for runny nose and throat congestion   Start famotidine 20mg  at bedtime for reflux symptoms and cough - monitor for improvement in cough and mucous  Follow up in 6 months

## 2022-07-03 ENCOUNTER — Other Ambulatory Visit: Payer: Self-pay | Admitting: Pulmonary Disease

## 2022-07-03 DIAGNOSIS — R0982 Postnasal drip: Secondary | ICD-10-CM

## 2022-07-03 DIAGNOSIS — R0981 Nasal congestion: Secondary | ICD-10-CM

## 2022-07-07 IMAGING — CT CT CHEST W/ CM
1 series · 15 of 34 positions shown, 19 images · IV contrast (APPLIED)
Comparison: None.

CLINICAL DATA: Pulmonary nodule, follow-up examination

EXAM:
CT CHEST WITH CONTRAST
TECHNIQUE: Multidetector CT imaging of the chest was performed during
intravenous contrast administration.
CONTRAST:  75mL [id number] IOPAMIDOL ([id number]) INJECTION 61%

[Series 2: chest w/cm · axial · 0.62mm/px · z∈[-228,+48]mm · 15 of 163 slices shown, 19 images]
[im 13/163  mediastinal]
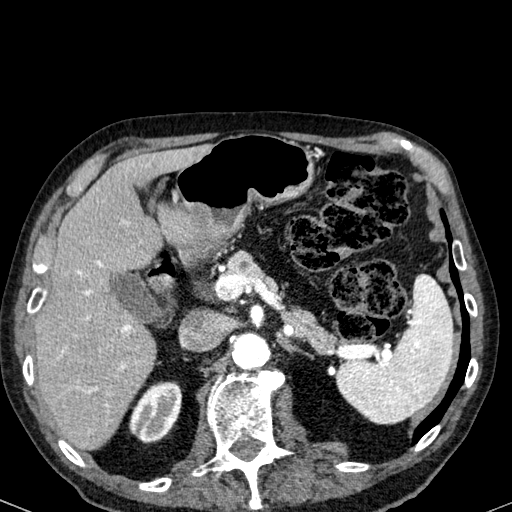
[im 13/163  lung]
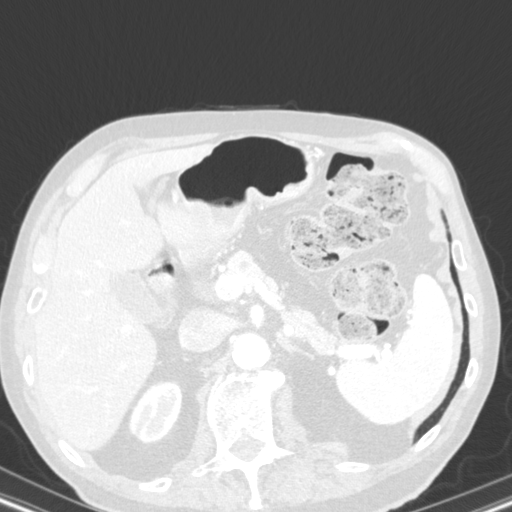
[im 25/163  lung]
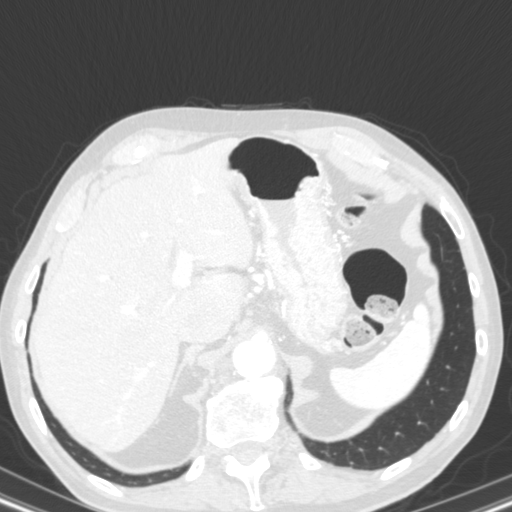
[im 33/163  lung]
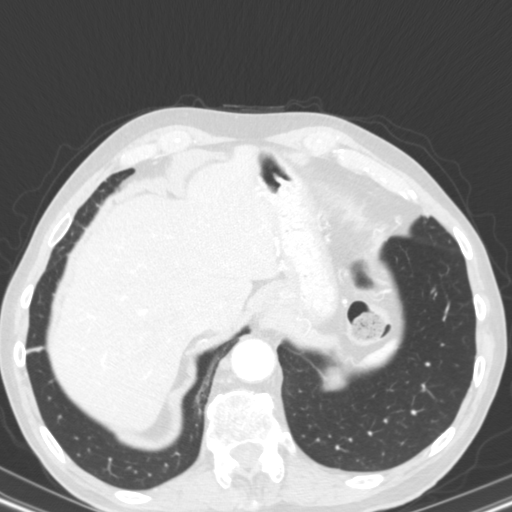
[im 43/163  lung]
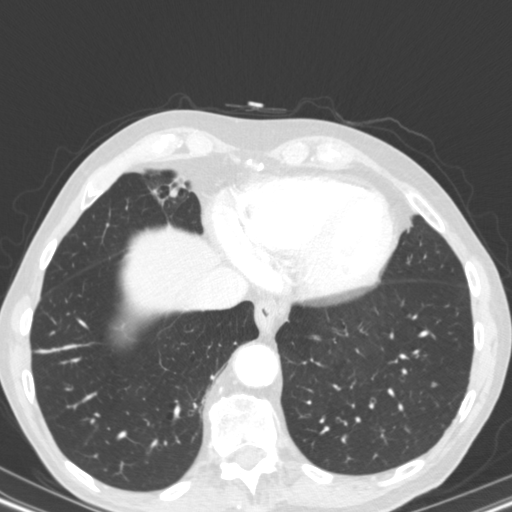
[im 55/163  mediastinal]
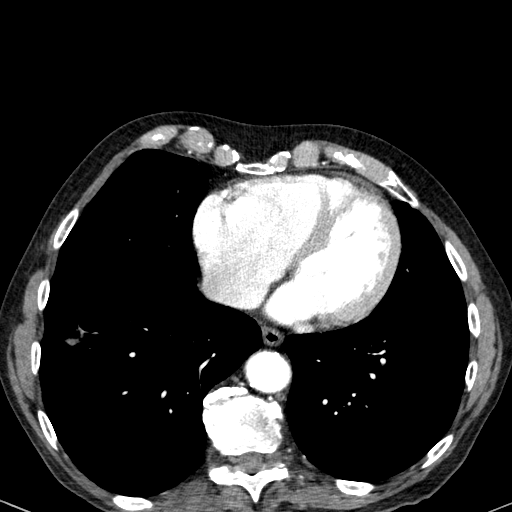
[im 55/163  lung]
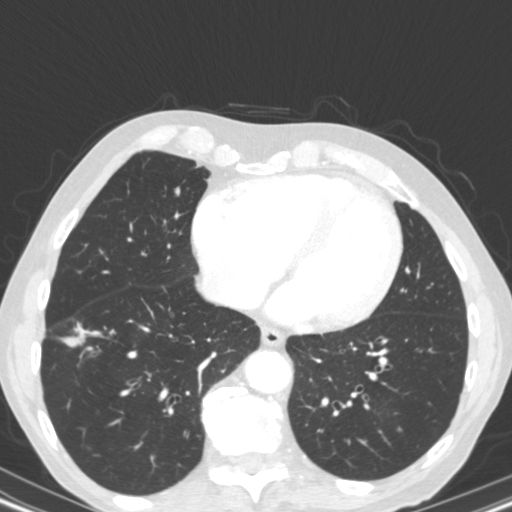
[im 65/163  lung]
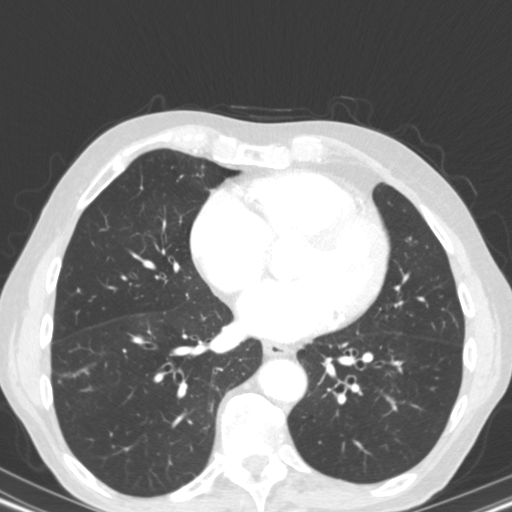
[im 73/163  lung]
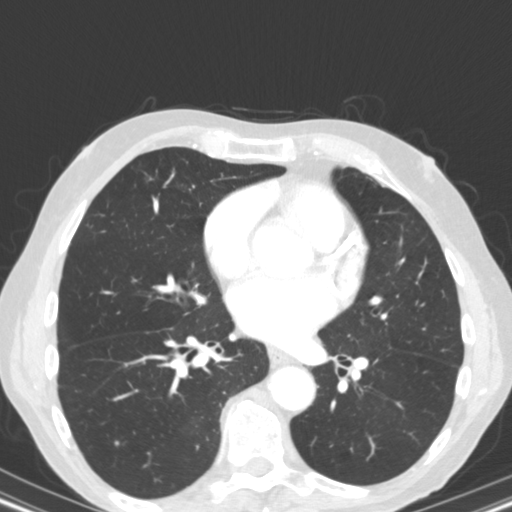
[im 85/163  lung]
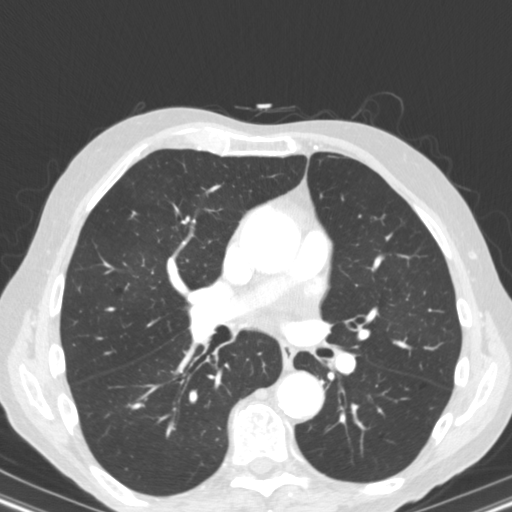
[im 91/163  mediastinal]
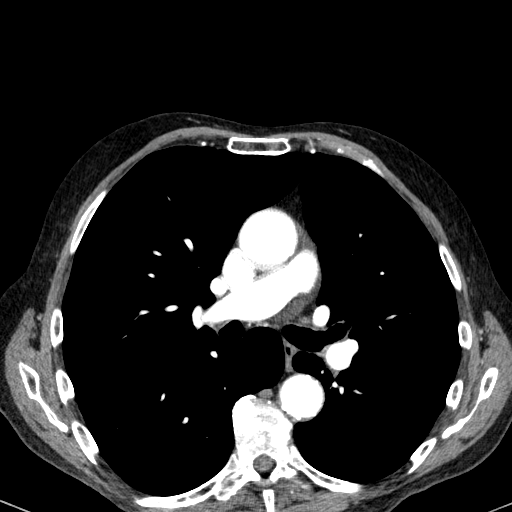
[im 91/163  lung]
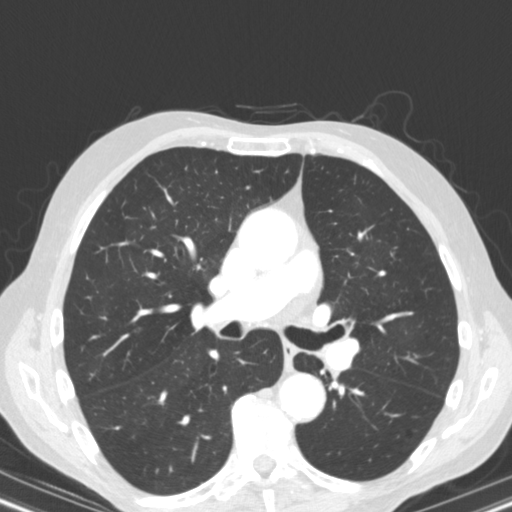
[im 98/163  lung]
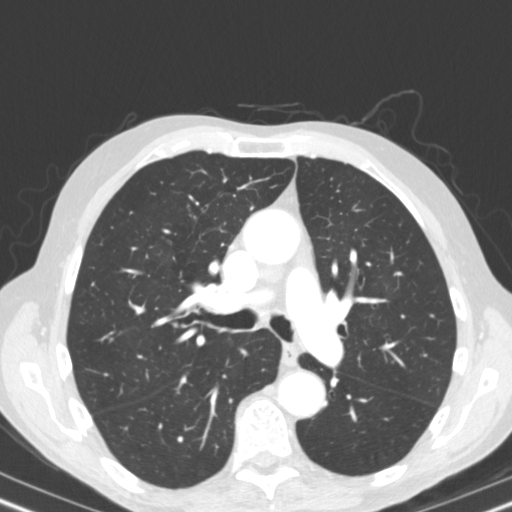
[im 109/163  lung]
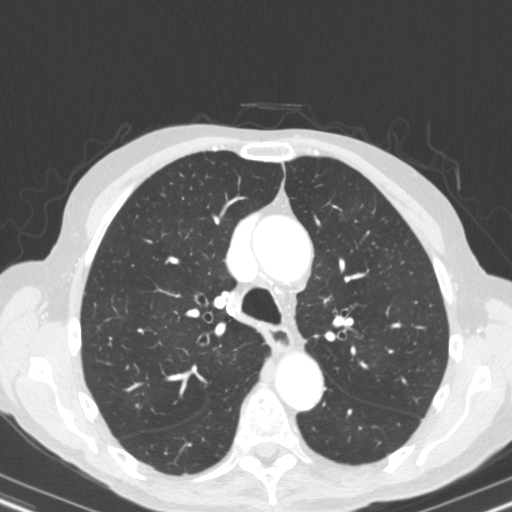
[im 121/163  lung]
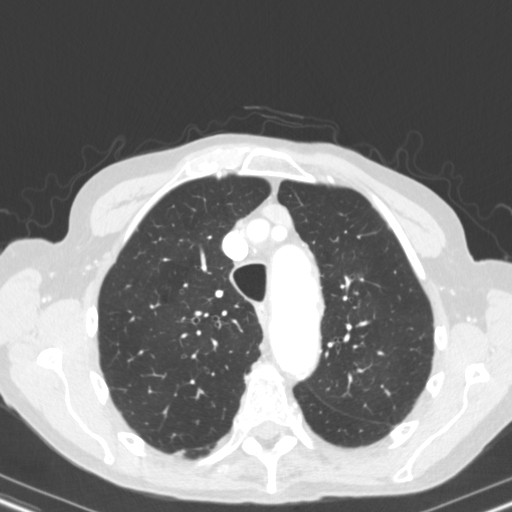
[im 130/163  mediastinal]
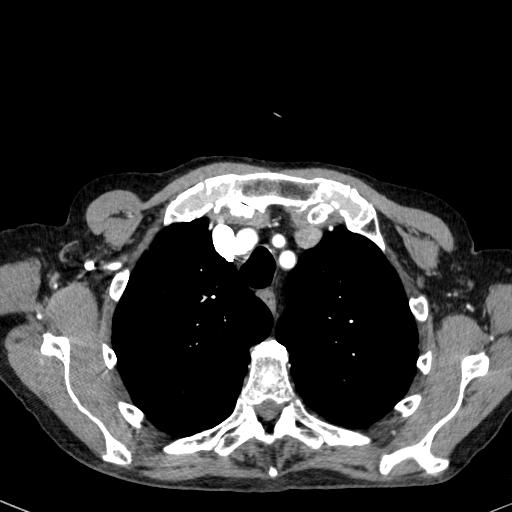
[im 130/163  lung]
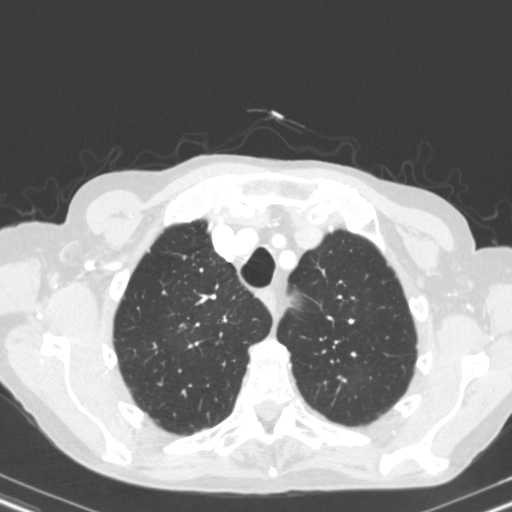
[im 139/163  lung]
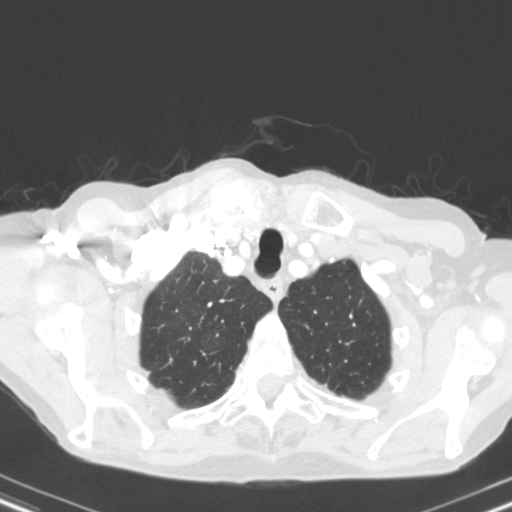
[im 151/163  lung]
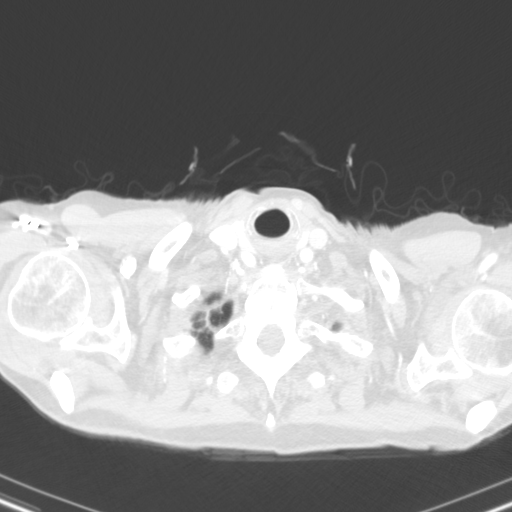

[15 of 34 positions shown; findings below may reference images not displayed]

FINDINGS: Cardiovascular: Extensive multi-vessel coronary artery
calcification. Global cardiac size is within normal limits. No
pericardial effusion. Central pulmonary arteries are of normal
caliber. The thoracic aorta demonstrates mild atheromatous plaque
within the aortic arch and within the descending thoracic aorta
without evidence of aneurysm.

Mediastinum/Nodes: No pathologic thoracic adenopathy. Thyroid gland
unremarkable. Esophagus unremarkable

Lungs/Pleura: Slightly spiculated pulmonary mass is seen within the
right lower lobe measuring 1.1 x 1.4 x 2.8 cm in greatest dimension
on axial image # 106/4 and coronal image # 72/3 no other focal
morphology nodules or infiltrates are identified. The lungs are
mildly symmetrically hyperinflated in keeping with changes of
underlying COPD. No pneumothorax or pleural effusion. The central
airways are widely patent.

Upper Abdomen: No acute abnormality.

Musculoskeletal: No lytic or blastic bone lesions are seen.
IMPRESSION: 1. Slightly spiculated pulmonary mass within the right lower lobe
measuring 1.1 x 1.4 x 2.8 cm in greatest dimension. Findings are
concerning for primary bronchogenic carcinoma. Further evaluation
with PET-CT is recommended if prior examinations are unavailable for
direct comparison. Sign.
2. No pathologic thoracic adenopathy.
3. Extensive multi-vessel coronary artery calcification.
4. COPD
5. Aortic atherosclerosis.

Aortic Atherosclerosis (29P9U-NCE.E) and Emphysema (29P9U-R5I.2).

## 2022-07-21 DIAGNOSIS — H353132 Nonexudative age-related macular degeneration, bilateral, intermediate dry stage: Secondary | ICD-10-CM | POA: Diagnosis not present

## 2022-09-06 DIAGNOSIS — N400 Enlarged prostate without lower urinary tract symptoms: Secondary | ICD-10-CM | POA: Diagnosis not present

## 2022-09-08 DIAGNOSIS — E441 Mild protein-calorie malnutrition: Secondary | ICD-10-CM | POA: Diagnosis not present

## 2022-09-08 DIAGNOSIS — I10 Essential (primary) hypertension: Secondary | ICD-10-CM | POA: Diagnosis not present

## 2022-09-08 DIAGNOSIS — F4321 Adjustment disorder with depressed mood: Secondary | ICD-10-CM | POA: Diagnosis not present

## 2022-10-26 DIAGNOSIS — Z23 Encounter for immunization: Secondary | ICD-10-CM | POA: Diagnosis not present

## 2022-11-15 DIAGNOSIS — Z23 Encounter for immunization: Secondary | ICD-10-CM | POA: Diagnosis not present

## 2022-11-26 DIAGNOSIS — R7989 Other specified abnormal findings of blood chemistry: Secondary | ICD-10-CM | POA: Diagnosis not present

## 2022-11-26 DIAGNOSIS — R7301 Impaired fasting glucose: Secondary | ICD-10-CM | POA: Diagnosis not present

## 2022-11-26 DIAGNOSIS — J309 Allergic rhinitis, unspecified: Secondary | ICD-10-CM | POA: Diagnosis not present

## 2022-11-26 DIAGNOSIS — N1831 Chronic kidney disease, stage 3a: Secondary | ICD-10-CM | POA: Diagnosis not present

## 2022-11-26 DIAGNOSIS — N401 Enlarged prostate with lower urinary tract symptoms: Secondary | ICD-10-CM | POA: Diagnosis not present

## 2022-11-26 DIAGNOSIS — J45909 Unspecified asthma, uncomplicated: Secondary | ICD-10-CM | POA: Diagnosis not present

## 2022-11-26 DIAGNOSIS — C3490 Malignant neoplasm of unspecified part of unspecified bronchus or lung: Secondary | ICD-10-CM | POA: Diagnosis not present

## 2022-11-26 DIAGNOSIS — N183 Chronic kidney disease, stage 3 unspecified: Secondary | ICD-10-CM | POA: Diagnosis not present

## 2022-11-26 DIAGNOSIS — D472 Monoclonal gammopathy: Secondary | ICD-10-CM | POA: Diagnosis not present

## 2022-11-26 DIAGNOSIS — F4321 Adjustment disorder with depressed mood: Secondary | ICD-10-CM | POA: Diagnosis not present

## 2022-11-26 DIAGNOSIS — E785 Hyperlipidemia, unspecified: Secondary | ICD-10-CM | POA: Diagnosis not present

## 2022-11-26 DIAGNOSIS — I129 Hypertensive chronic kidney disease with stage 1 through stage 4 chronic kidney disease, or unspecified chronic kidney disease: Secondary | ICD-10-CM | POA: Diagnosis not present

## 2022-11-26 DIAGNOSIS — Z0000 Encounter for general adult medical examination without abnormal findings: Secondary | ICD-10-CM | POA: Diagnosis not present

## 2022-11-26 DIAGNOSIS — E441 Mild protein-calorie malnutrition: Secondary | ICD-10-CM | POA: Diagnosis not present

## 2022-11-30 ENCOUNTER — Other Ambulatory Visit: Payer: Self-pay | Admitting: Pulmonary Disease

## 2022-11-30 DIAGNOSIS — R0982 Postnasal drip: Secondary | ICD-10-CM

## 2022-11-30 DIAGNOSIS — R0981 Nasal congestion: Secondary | ICD-10-CM

## 2022-12-02 ENCOUNTER — Telehealth: Payer: Self-pay | Admitting: *Deleted

## 2022-12-02 NOTE — Telephone Encounter (Signed)
Callled patient to inform of Ct for 12-03-22- arrival time- 4:15 pm @ [facility], no restrictions to scan, patient to be called by Laurence Aly on 12-06-22 @ 1 pm with results, spoke with patient's daughterHarriett Ramsey and she is aware of these appts. and the instructions

## 2022-12-03 ENCOUNTER — Ambulatory Visit (HOSPITAL_COMMUNITY): Payer: Medicare Other | Attending: Radiation Oncology

## 2022-12-06 ENCOUNTER — Ambulatory Visit
Admission: RE | Admit: 2022-12-06 | Discharge: 2022-12-06 | Disposition: A | Discharge status: Home / Self Care | Payer: Medicare Other | Source: Ambulatory Visit | Attending: Radiation Oncology | Admitting: Radiation Oncology

## 2022-12-15 ENCOUNTER — Ambulatory Visit (HOSPITAL_COMMUNITY)
Admission: RE | Admit: 2022-12-15 | Discharge: 2022-12-15 | Disposition: A | Discharge status: Home / Self Care | Payer: Medicare Other | Source: Ambulatory Visit | Attending: Radiation Oncology | Admitting: Radiation Oncology

## 2022-12-15 DIAGNOSIS — C3490 Malignant neoplasm of unspecified part of unspecified bronchus or lung: Secondary | ICD-10-CM | POA: Diagnosis not present

## 2022-12-15 DIAGNOSIS — J439 Emphysema, unspecified: Secondary | ICD-10-CM | POA: Diagnosis not present

## 2022-12-15 DIAGNOSIS — J90 Pleural effusion, not elsewhere classified: Secondary | ICD-10-CM | POA: Diagnosis not present

## 2022-12-15 DIAGNOSIS — C3431 Malignant neoplasm of lower lobe, right bronchus or lung: Secondary | ICD-10-CM | POA: Diagnosis not present

## 2022-12-15 DIAGNOSIS — I700 Atherosclerosis of aorta: Secondary | ICD-10-CM | POA: Diagnosis not present

## 2022-12-15 LAB — POCT I-STAT CREATININE: Creatinine, Ser: 1.3 mg/dL — ABNORMAL HIGH (ref 0.61–1.24)

## 2022-12-15 MED ORDER — SODIUM CHLORIDE (PF) 0.9 % IJ SOLN
INTRAMUSCULAR | Status: AC
  Filled 2022-12-15: qty 50

## 2022-12-15 MED ORDER — IOHEXOL 300 MG/ML  SOLN
75.0000 mL | Freq: Once | INTRAMUSCULAR | Status: AC | PRN
  Administered 2022-12-15: 75 mL via INTRAVENOUS

## 2022-12-16 NOTE — Progress Notes (Signed)
Radiation Oncology         [phone] ________________________________  Outpatient Follow Up - Conducted via telephone at patient request.  I spoke with the patient to conduct this consult visit via telephone. The patient was notified in advance and was offered an in person or telemedicine meeting to allow for face to face communication but instead preferred to proceed with a telephone visit. ________________________________    Name: [name].        MRN: [id number]   Date of Service:12/16/22  DOB: 12/07/1924  QI:ONGEXB, Loraine Leriche, MD  Loreli Slot, *     REFERRING PHYSICIAN: Loreli Slot, *  DIAGNOSIS: The primary encounter diagnosis was Nodule of lower lobe of right lung. A diagnosis of Malignant neoplasm of bronchus of right lower lobe [facility]) was also pertinent to this visit.  HISTORY OF PRESENT ILLNESS: [name]. is a 89 y.o.  male with a history of putative stage I lung cancer of the right lower lobe. The patient has a longstanding history of smoking,and is a lifelong smoker.  A chest x-ray at [facility] after a flare of asthma  revealed a nodule in the lung.  He proceeded with CT imaging on 10/23/2019 which revealed a 1.1 x 1.4 x 2.8 cm nodule that was spiculated in the right lower lobe, a PET scan on 11/23/2019 revealed an SUV of 3.2 and the nodule in the right lower lobe measured 1.6 x 1.4 cm, blood pool activity was 2.5.  There was also focal hypermetabolic activity in the right infrahilar region with an SUV max of 4.2 however there was no CT correlate for this.  He was counseled on options of proceeding with biopsy but given his age and comorbidities declined, he did have a repeat CT scan with super D component on 02/19/2020 redemonstrated the lobulated pulmonary nodule in the right lower lobe which abuts the major fissure measuring 1.4 x 1.3 cm. While it was not significantly changed but there was more hypodensity of the internal appearance of  the nodule. He was not interested in surgical resection and rather proceeded with Stereotactic Body Radiotherapy (SBRT). He completed this in February 2022.   Since his treatment, he has been NED with stable emphysematous and atherosclerotic changes. His most recent CT scan on 12/15/22 showed stable changes in the right to to lower lung but also new changes of clustered irregular nodular opacity in the right middle lobe. New ground glass opacity of the medial right upper lobe was also noted. Stable scattered pulmonary nodules were also noted, the largest measuring 4 mm in greatest dimension in the LUL. He's contacted by phone today to review these results.    PREVIOUS RADIATION THERAPY:   03/25/2020 through 04/01/2020  SBRT Treatment Site Technique Total Dose (Gy) Dose per Fx (Gy) Completed Fx Beam Energies  Lung, Right: Lung_Rt IMRT 54/54 18 3/3 6XFFF      PAST MEDICAL HISTORY:  Past Medical History:  Diagnosis Date   Anemia    Arthritis    Cataract    Complication following bilateral mastoidectomy    Dysphagia    with large pills   Gout    HLD (hyperlipidemia)    HTN (hypertension)    Lung nodule 02/19/2020   Melanoma (HCC) 2009   Left forehead   Right posterior capsular opacification 06/21/2019   Wrist fracture    left    PAST SURGICAL HISTORY: Past Surgical History:  Procedure Laterality Date  cataracts     MASTOIDECTOMY REVISION Bilateral years ago   ORIF WRIST FRACTURE Left 1975   QUADRICEPS TENDON REPAIR Right 08/23/2018   Procedure: REPAIR QUADRICEP TENDON;  Surgeon: Ollen Gross, MD;  Location: [facility];  Service: Orthopedics;  Laterality: Right;    TOTAL HIP ARTHROPLASTY Left 05/05/2016   Procedure: LEFT TOTAL HIP ARTHROPLASTY ANTERIOR APPROACH;  Surgeon: Ollen Gross, MD;  Location: [facility];  Service: Orthopedics;  Laterality: Left;    PAST SOCIAL HISTORY:  Social History   Socioeconomic History   Marital status: Widowed    Spouse name: Not on file    Number of children: 2   Years of education: Not on file   Highest education level: Not on file  Occupational History   Occupation: retired  Tobacco Use   Smoking status: Never   Smokeless tobacco: Never  Vaping Use   Vaping status: Never Used  Substance and Sexual Activity   Alcohol use: No   Drug use: No   Sexual activity: Not on file  Other Topics Concern   Not on file  Social History Narrative   Not on file   Social Determinants of Health   Financial Resource Strain: Not on file  Food Insecurity: No Food Insecurity (05/28/2022)   Hunger Vital Sign    Worried About Running Out of Food in the Last Year: Never true    Ran Out of Food in the Last Year: Never true  Transportation Needs: No Transportation Needs (05/28/2022)   PRAPARE - Administrator, Civil Service (Medical): No    Lack of Transportation (Non-Medical): No  Physical Activity: Not on file  Stress: Not on file  Social Connections: Not on file  Intimate Partner Violence: Not At Risk (05/28/2022)   Humiliation, Afraid, Rape, and Kick questionnaire    Fear of Current or Ex-Partner: No    Emotionally Abused: No    Physically Abused: No    Sexually Abused: No    PAST FAMILY HISTORY: Family History  Problem Relation Age of Onset   Pneumonia Mother    Heart attack Father    Breast cancer Sister    Heart attack Nephew 66   Heart failure Sister    Kidney Stones Daughter    Esophageal cancer Neg Hx    Colon cancer Neg Hx    Rectal cancer Neg Hx    Stomach cancer Neg Hx     MEDICATIONS  Current Outpatient Medications  Medication Sig Dispense Refill   amLODipine (NORVASC) 2.5 MG tablet Take 2.5 mg by mouth daily.     Amlodipine Besylate POWD amlodipine besylate (bulk)     aspirin EC 81 MG tablet Take 81 mg by mouth daily.     calcium carbonate (TUMS - DOSED IN MG ELEMENTAL CALCIUM) 500 MG chewable tablet Chew 1-2 tablets by mouth as needed for indigestion or heartburn.     Calcium-Vitamin  D-Vitamin K (VIACTIV CALCIUM PLUS D) 650-12.5-40 MG-MCG-MCG CHEW Chew 1 each by mouth daily.     Camphor-Eucalyptus-Menthol (CHEST RUB) 4.8-1.2-2.6 % OINT Vicks Vaporub     chlorthalidone (HYGROTON) 25 MG tablet Take 12.5 mg by mouth daily.     Cholecalciferol (VITAMIN D) 50 MCG (2000 UT) tablet Take 2,000 Units by mouth daily.     Cyanocobalamin (B-12) 2500 MCG TABS Take 2,500 mcg by mouth daily.     famotidine (PEPCID) 20 MG tablet Take 1 tablet (20 mg total) by mouth at bedtime. 30 tablet 5   fexofenadine (  ALLEGRA ALLERGY) 180 MG tablet Take 1 tablet (180 mg total) by mouth daily. 30 tablet 6   finasteride (PROSCAR) 5 MG tablet Take 5 mg by mouth daily.     fluticasone (FLONASE) 50 MCG/ACT nasal spray INSTILL 1 SPRAY INTO EACH NOSTRIL ONCE DAILY AS DIRECTED 16 mL 2   fluticasone-salmeterol (ADVAIR) 100-50 MCG/ACT AEPB Inhale 1 puff into the lungs 2 (two) times daily.     ipratropium (ATROVENT) 0.03 % nasal spray INSTILL 2 SPRAYS INTO EACH NOSTRIL EVERY 12 HOURS AS DIRECTED 30 mL 12   Multiple Minerals-Vitamins (BONE ESSENTIALS) CAPS calcium     NON FORMULARY Take 1 tablet by mouth daily. Vision Vite - 1 tablet daily     Omega-3 Fatty Acids (RA FISH OIL) 1000 MG CAPS Fish Oil     polyethylene glycol (MIRALAX / GLYCOLAX) packet Take 17 g by mouth daily as needed for mild constipation.      rosuvastatin (CRESTOR) 5 MG tablet Take 5 mg by mouth daily.     Wheat Dextrin (BENEFIBER) POWD Take 10 mLs by mouth daily. 2 tsp Daily     No current facility-administered medications for this visit.    ALLERGIES:  Allergies  Allergen Reactions   Other Other (See Comments)    Pt can not take gel caps - med gets stuck in throat        REVIEW OF SYSTEMS: On review of systems, the patient reports he is doing well and still doing pretty well. He continues using his rescue inhaler 3 times a month on average for his asthma, but denies any difficulty with shortness of breath, fevers, or recent productive  cough. He denies any shortness of breath or chest pain currently. No other complaints are verbalized.       PHYSICAL EXAM:  Unable to assess due to encounter type.    ECOG = 1 0 - Asymptomatic (Fully active, able to carry on all predisease activities without restriction) 1 - Symptomatic but completely ambulatory (Restricted in physically strenuous activity but ambulatory and able to carry out work of a light or sedentary nature. For example, light housework, office work) 2 - Symptomatic, <50% in bed during the day (Ambulatory and capable of all self care but unable to carry out any work activities. Up and about more than 50% of waking hours) 3 - Symptomatic, >50% in bed, but not bedbound (Capable of only limited self-care, confined to bed or chair 50% or more of waking hours) 4 - Bedbound (Completely disabled. Cannot carry on any self-care. Totally confined to bed or chair) 5 - Death   Santiago Glad MM, Creech RH, Tormey DC, et al. [phone]). "Toxicity and response criteria of the [facility]". Am. Evlyn Clines. Oncol. 5 (6): 649-55      IMPRESSION/PLAN: 1.         Putative Stage IA2, cT1bN0M0 NSCLC of the RLL. The patient's imaging is stable given the type of treatment he's previously received. We will follow this closely. We will repeat his next scan in 3 months time to follow up on the RML nodular change, but anticipate if stable to improved, we would go back to 6 month intervals for follow up since he continues to be a very active man for his age. 2. RML nodule. We will repeat a CT in 3 months time to re-evaluate this finding. He is in agreement with this plan.  This encounter was conducted via telephone.  The patient has provided two factor identification and has  given verbal consent for this type of encounter and has been advised to only accept a meeting of this type in a secure network environment. The time spent during this encounter was 35 minutes including preparation,  discussion, and coordination of the patient's care. The attendants for this meeting include   Ronny Bacon  and [name].. During the encounter,  Ronny Bacon was located remotely. [name]. was located at home.      Osker Mason, PAC

## 2022-12-20 ENCOUNTER — Encounter: Payer: Self-pay | Admitting: Radiation Oncology

## 2022-12-20 ENCOUNTER — Ambulatory Visit
Admission: RE | Admit: 2022-12-20 | Discharge: 2022-12-20 | Disposition: A | Discharge status: Home / Self Care | Payer: Medicare Other | Source: Ambulatory Visit | Attending: Radiation Oncology | Admitting: Radiation Oncology

## 2022-12-20 DIAGNOSIS — C3431 Malignant neoplasm of lower lobe, right bronchus or lung: Secondary | ICD-10-CM | POA: Diagnosis not present

## 2022-12-20 NOTE — Progress Notes (Signed)
Telephone nursing appointment for review of most recent scan results. I verified patient's identity x2 and began nursing interview.   Patient reports doing well. Patient denies any other related issues at this time.   Meaningful use complete.   Patient aware of their 2:30pm telephone appointment w/ Laurence Aly PA-C. I left my extension [phone] in case patient needs anything. Patient verbalized understanding. This concludes the nursing interview.   Patient contact [phone]     Ruel Favors, LPN

## 2023-01-05 DIAGNOSIS — D1801 Hemangioma of skin and subcutaneous tissue: Secondary | ICD-10-CM | POA: Diagnosis not present

## 2023-01-05 DIAGNOSIS — L821 Other seborrheic keratosis: Secondary | ICD-10-CM | POA: Diagnosis not present

## 2023-01-05 DIAGNOSIS — L570 Actinic keratosis: Secondary | ICD-10-CM | POA: Diagnosis not present

## 2023-01-05 DIAGNOSIS — L814 Other melanin hyperpigmentation: Secondary | ICD-10-CM | POA: Diagnosis not present

## 2023-01-05 DIAGNOSIS — Z85828 Personal history of other malignant neoplasm of skin: Secondary | ICD-10-CM | POA: Diagnosis not present

## 2023-01-05 DIAGNOSIS — L720 Epidermal cyst: Secondary | ICD-10-CM | POA: Diagnosis not present

## 2023-01-20 ENCOUNTER — Emergency Department (HOSPITAL_COMMUNITY): Payer: Medicare Other

## 2023-01-20 ENCOUNTER — Inpatient Hospital Stay (HOSPITAL_COMMUNITY)
Admission: EM | Admit: 2023-01-20 | Discharge: 2023-01-22 | DRG: 309 | Disposition: A | Discharge status: Home / Self Care | Payer: Medicare Other | Attending: Internal Medicine | Admitting: Internal Medicine

## 2023-01-20 ENCOUNTER — Encounter (HOSPITAL_COMMUNITY): Payer: Self-pay

## 2023-01-20 ENCOUNTER — Other Ambulatory Visit: Payer: Self-pay

## 2023-01-20 DIAGNOSIS — Z96642 Presence of left artificial hip joint: Secondary | ICD-10-CM | POA: Diagnosis present

## 2023-01-20 DIAGNOSIS — M109 Gout, unspecified: Secondary | ICD-10-CM | POA: Diagnosis present

## 2023-01-20 DIAGNOSIS — Z7951 Long term (current) use of inhaled steroids: Secondary | ICD-10-CM

## 2023-01-20 DIAGNOSIS — Z7982 Long term (current) use of aspirin: Secondary | ICD-10-CM | POA: Diagnosis not present

## 2023-01-20 DIAGNOSIS — I484 Atypical atrial flutter: Secondary | ICD-10-CM | POA: Diagnosis not present

## 2023-01-20 DIAGNOSIS — E785 Hyperlipidemia, unspecified: Secondary | ICD-10-CM | POA: Diagnosis present

## 2023-01-20 DIAGNOSIS — C3431 Malignant neoplasm of lower lobe, right bronchus or lung: Secondary | ICD-10-CM | POA: Diagnosis present

## 2023-01-20 DIAGNOSIS — R1310 Dysphagia, unspecified: Secondary | ICD-10-CM | POA: Diagnosis present

## 2023-01-20 DIAGNOSIS — R0600 Dyspnea, unspecified: Secondary | ICD-10-CM | POA: Diagnosis not present

## 2023-01-20 DIAGNOSIS — I2510 Atherosclerotic heart disease of native coronary artery without angina pectoris: Secondary | ICD-10-CM | POA: Diagnosis present

## 2023-01-20 DIAGNOSIS — Z7901 Long term (current) use of anticoagulants: Secondary | ICD-10-CM

## 2023-01-20 DIAGNOSIS — E876 Hypokalemia: Secondary | ICD-10-CM | POA: Insufficient documentation

## 2023-01-20 DIAGNOSIS — Z923 Personal history of irradiation: Secondary | ICD-10-CM

## 2023-01-20 DIAGNOSIS — I10 Essential (primary) hypertension: Secondary | ICD-10-CM | POA: Diagnosis present

## 2023-01-20 DIAGNOSIS — I4891 Unspecified atrial fibrillation: Principal | ICD-10-CM | POA: Diagnosis present

## 2023-01-20 DIAGNOSIS — Z803 Family history of malignant neoplasm of breast: Secondary | ICD-10-CM | POA: Diagnosis not present

## 2023-01-20 DIAGNOSIS — Z8249 Family history of ischemic heart disease and other diseases of the circulatory system: Secondary | ICD-10-CM

## 2023-01-20 DIAGNOSIS — Z85820 Personal history of malignant melanoma of skin: Secondary | ICD-10-CM | POA: Diagnosis not present

## 2023-01-20 DIAGNOSIS — Z79899 Other long term (current) drug therapy: Secondary | ICD-10-CM

## 2023-01-20 DIAGNOSIS — J984 Other disorders of lung: Secondary | ICD-10-CM | POA: Diagnosis not present

## 2023-01-20 DIAGNOSIS — H401123 Primary open-angle glaucoma, left eye, severe stage: Secondary | ICD-10-CM | POA: Diagnosis present

## 2023-01-20 DIAGNOSIS — J45909 Unspecified asthma, uncomplicated: Secondary | ICD-10-CM | POA: Diagnosis present

## 2023-01-20 DIAGNOSIS — R911 Solitary pulmonary nodule: Secondary | ICD-10-CM | POA: Diagnosis not present

## 2023-01-20 DIAGNOSIS — I2489 Other forms of acute ischemic heart disease: Secondary | ICD-10-CM | POA: Diagnosis not present

## 2023-01-20 DIAGNOSIS — K219 Gastro-esophageal reflux disease without esophagitis: Secondary | ICD-10-CM | POA: Diagnosis present

## 2023-01-20 DIAGNOSIS — R9389 Abnormal findings on diagnostic imaging of other specified body structures: Secondary | ICD-10-CM | POA: Diagnosis present

## 2023-01-20 DIAGNOSIS — R000 Tachycardia, unspecified: Secondary | ICD-10-CM | POA: Diagnosis not present

## 2023-01-20 DIAGNOSIS — R918 Other nonspecific abnormal finding of lung field: Secondary | ICD-10-CM | POA: Diagnosis not present

## 2023-01-20 LAB — CBC WITH DIFFERENTIAL/PLATELET
Abs Immature Granulocytes: 0.02 10*3/uL (ref 0.00–0.07)
Basophils Absolute: 0 10*3/uL (ref 0.0–0.1)
Basophils Relative: 1 %
Eosinophils Absolute: 0 10*3/uL (ref 0.0–0.5)
Eosinophils Relative: 0 %
HCT: 35.8 % — ABNORMAL LOW (ref 39.0–52.0)
Hemoglobin: 11.5 g/dL — ABNORMAL LOW (ref 13.0–17.0)
Immature Granulocytes: 0 %
Lymphocytes Relative: 12 %
Lymphs Abs: 0.8 10*3/uL (ref 0.7–4.0)
MCH: 31.9 pg (ref 26.0–34.0)
MCHC: 32.1 g/dL (ref 30.0–36.0)
MCV: 99.4 fL (ref 80.0–100.0)
Monocytes Absolute: 0.4 10*3/uL (ref 0.1–1.0)
Monocytes Relative: 7 %
Neutro Abs: 5.3 10*3/uL (ref 1.7–7.7)
Neutrophils Relative %: 80 %
Platelets: 231 10*3/uL (ref 150–400)
RBC: 3.6 MIL/uL — ABNORMAL LOW (ref 4.22–5.81)
RDW: 13.2 % (ref 11.5–15.5)
WBC: 6.6 10*3/uL (ref 4.0–10.5)
nRBC: 0 % (ref 0.0–0.2)

## 2023-01-20 LAB — TROPONIN I (HIGH SENSITIVITY)
Troponin I (High Sensitivity): 94 ng/L — ABNORMAL HIGH (ref <18)
Troponin I (High Sensitivity): 112 ng/L (ref <18)

## 2023-01-20 LAB — COMPREHENSIVE METABOLIC PANEL
ALT: 19 U/L (ref 0–44)
AST: 20 U/L (ref 15–41)
Albumin: 3.8 g/dL (ref 3.5–5.0)
Alkaline Phosphatase: 59 U/L (ref 38–126)
Anion gap: 7 (ref 5–15)
BUN: 35 mg/dL — ABNORMAL HIGH (ref 8–23)
CO2: 27 mmol/L (ref 22–32)
Calcium: 9.4 mg/dL (ref 8.9–10.3)
Chloride: 102 mmol/L (ref 98–111)
Creatinine, Ser: 1.32 mg/dL — ABNORMAL HIGH (ref 0.61–1.24)
GFR, Estimated: 49 mL/min — ABNORMAL LOW (ref >60)
Glucose, Bld: 115 mg/dL — ABNORMAL HIGH (ref 70–99)
Potassium: 3.9 mmol/L (ref 3.5–5.1)
Sodium: 136 mmol/L (ref 135–145)
Total Bilirubin: 0.6 mg/dL (ref <1.2)
Total Protein: 6.7 g/dL (ref 6.5–8.1)

## 2023-01-20 LAB — BRAIN NATRIURETIC PEPTIDE: B Natriuretic Peptide: 186.9 pg/mL — ABNORMAL HIGH (ref 0.0–100.0)

## 2023-01-20 LAB — D-DIMER, QUANTITATIVE: D-Dimer, Quant: 0.81 ug{FEU}/mL — ABNORMAL HIGH (ref 0.00–0.50)

## 2023-01-20 LAB — TSH: TSH: 2.054 u[IU]/mL (ref 0.350–4.500)

## 2023-01-20 LAB — MAGNESIUM: Magnesium: 1.9 mg/dL (ref 1.7–2.4)

## 2023-01-20 MED ORDER — ACETAMINOPHEN 650 MG RE SUPP: 650.0000 mg | Freq: Four times a day (QID) | RECTAL | Status: DC | PRN

## 2023-01-20 MED ORDER — LACTATED RINGERS IV BOLUS
1000.0000 mL | Freq: Once | INTRAVENOUS | Status: AC
  Administered 2023-01-20: 1000 mL via INTRAVENOUS

## 2023-01-20 MED ORDER — APIXABAN 2.5 MG PO TABS
2.5000 mg | ORAL_TABLET | Freq: Two times a day (BID) | ORAL | Status: DC
Start: 2023-01-20 — End: 2023-01-22
  Administered 2023-01-20 – 2023-01-22 (×4): 2.5 mg via ORAL
  Filled 2023-01-20 (×4): qty 1

## 2023-01-20 MED ORDER — FLUTICASONE PROPIONATE 50 MCG/ACT NA SUSP
1.0000 | Freq: Every morning | NASAL | Status: DC
  Administered 2023-01-21 – 2023-01-22 (×2): 1 via NASAL
  Filled 2023-01-20: qty 16

## 2023-01-20 MED ORDER — IPRATROPIUM BROMIDE 0.03 % NA SOLN
2.0000 | Freq: Two times a day (BID) | NASAL | Status: DC
  Administered 2023-01-20 – 2023-01-22 (×4): 2 via NASAL
  Filled 2023-01-20: qty 30

## 2023-01-20 MED ORDER — ONDANSETRON HCL 4 MG/2ML IJ SOLN: 4.0000 mg | Freq: Four times a day (QID) | INTRAMUSCULAR | Status: DC | PRN

## 2023-01-20 MED ORDER — CHLORTHALIDONE 25 MG PO TABS
12.5000 mg | ORAL_TABLET | Freq: Every day | ORAL | Status: DC
  Filled 2023-01-20: qty 0.5

## 2023-01-20 MED ORDER — TRAZODONE HCL 50 MG PO TABS: 25.0000 mg | ORAL_TABLET | Freq: Every evening | ORAL | Status: DC | PRN

## 2023-01-20 MED ORDER — ONDANSETRON HCL 4 MG PO TABS: 4.0000 mg | ORAL_TABLET | Freq: Four times a day (QID) | ORAL | Status: DC | PRN

## 2023-01-20 MED ORDER — MOMETASONE FURO-FORMOTEROL FUM 100-5 MCG/ACT IN AERO
2.0000 | INHALATION_SPRAY | Freq: Two times a day (BID) | RESPIRATORY_TRACT | Status: DC
  Administered 2023-01-21 – 2023-01-22 (×3): 2 via RESPIRATORY_TRACT
  Filled 2023-01-20: qty 8.8

## 2023-01-20 MED ORDER — ROSUVASTATIN CALCIUM 20 MG PO TABS
20.0000 mg | ORAL_TABLET | Freq: Every day | ORAL | Status: DC
  Administered 2023-01-20 – 2023-01-22 (×3): 20 mg via ORAL
  Filled 2023-01-20 (×3): qty 1

## 2023-01-20 MED ORDER — ALBUTEROL SULFATE (2.5 MG/3ML) 0.083% IN NEBU: 2.5000 mg | INHALATION_SOLUTION | RESPIRATORY_TRACT | Status: DC | PRN

## 2023-01-20 MED ORDER — IOHEXOL 350 MG/ML SOLN
75.0000 mL | Freq: Once | INTRAVENOUS | Status: AC | PRN
  Administered 2023-01-20: 75 mL via INTRAVENOUS

## 2023-01-20 MED ORDER — DILTIAZEM LOAD VIA INFUSION
10.0000 mg | Freq: Once | INTRAVENOUS | Status: AC
  Administered 2023-01-20: 10 mg via INTRAVENOUS
  Filled 2023-01-20: qty 10

## 2023-01-20 MED ORDER — DILTIAZEM HCL-DEXTROSE 125-5 MG/125ML-% IV SOLN (PREMIX)
5.0000 mg/h | INTRAVENOUS | Status: DC
  Administered 2023-01-20: 5 mg/h via INTRAVENOUS
  Administered 2023-01-21: 10 mg/h via INTRAVENOUS
  Filled 2023-01-20 (×2): qty 125

## 2023-01-20 MED ORDER — ACETAMINOPHEN 325 MG PO TABS: 650.0000 mg | ORAL_TABLET | Freq: Four times a day (QID) | ORAL | Status: DC | PRN

## 2023-01-20 MED ORDER — DILTIAZEM HCL 25 MG/5ML IV SOLN
10.0000 mg | Freq: Once | INTRAVENOUS | Status: AC
  Administered 2023-01-20: 10 mg via INTRAVENOUS
  Filled 2023-01-20: qty 5

## 2023-01-20 NOTE — H&P (Signed)
History and Physical  [name]. EPP:[id number] DOB: 1924-06-08 DOA: 01/20/2023  PCP: [name], MD   Chief Complaint: Tachycardia  HPI: [name]. is a 89 y.o. male with medical history significant for hypertension, hyperlipidemia being admitted to the hospital with new onset atrial fibrillation with RVR.  Patient states he has been in his usual state of health, when he woke up this morning he felt particularly weak, and doing something was wrong.  He checked his vital signs as he typically does every morning, discovered that his heart rate was 144.  Denies any dizziness, chest pain, or palpitations.  Has never had this issue in the past.  No recent illness, or changes in his medications.  His daughter and son-in-law are at the bedside, states that he is incredibly active and healthy for his age.  Review of Systems: Please see HPI for pertinent positives and negatives. A complete 10 system review of systems are otherwise negative.  Past Medical History:  Diagnosis Date   Anemia    Arthritis    Cataract    Complication following bilateral mastoidectomy    Dysphagia    with large pills   Gout    HLD (hyperlipidemia)    HTN (hypertension)    Lung nodule 02/19/2020   Melanoma (HCC) 2009   Left forehead   Right posterior capsular opacification 06/21/2019   Wrist fracture    left   Past Surgical History:  Procedure Laterality Date   cataracts     MASTOIDECTOMY REVISION Bilateral years ago   ORIF WRIST FRACTURE Left 1975   QUADRICEPS TENDON REPAIR Right 08/23/2018   Procedure: REPAIR QUADRICEP TENDON;  Surgeon: Ollen Gross, MD;  Location: [facility];  Service: Orthopedics;  Laterality: Right;    TOTAL HIP ARTHROPLASTY Left 05/05/2016   Procedure: LEFT TOTAL HIP ARTHROPLASTY ANTERIOR APPROACH;  Surgeon: Ollen Gross, MD;  Location: [facility];  Service: Orthopedics;  Laterality: Left;    Social History:  reports that he has never smoked. He has never used smokeless  tobacco. He reports that he does not drink alcohol and does not use drugs.   Allergies  Allergen Reactions   Other Other (See Comments)    Pt cannot take gel caps - med gets stuck in throat     Family History  Problem Relation Age of Onset   Pneumonia Mother    Heart attack Father    Breast cancer Sister    Heart attack Nephew 66   Heart failure Sister    Kidney Stones Daughter    Esophageal cancer Neg Hx    Colon cancer Neg Hx    Rectal cancer Neg Hx    Stomach cancer Neg Hx      Prior to Admission medications   Medication Sig Start Date End Date Taking? Authorizing Provider  amLODipine (NORVASC) 2.5 MG tablet Take 2.5 mg by mouth daily.   Yes [provider]  calcium carbonate (TUMS - DOSED IN MG ELEMENTAL CALCIUM) 500 MG chewable tablet Chew 1-2 tablets by mouth as needed for indigestion or heartburn.   Yes [provider]  Calcium-Vitamin D-Vitamin K (VIACTIV CALCIUM PLUS D) 650-12.5-40 MG-MCG-MCG CHEW Chew 1 each by mouth daily.   Yes [provider]  Camphor-Eucalyptus-Menthol (CHEST RUB) 4.8-1.2-2.6 % OINT Apply 1 application  topically See admin instructions. Vick's vaporub- Apply to the chest nightly, then cover   Yes [provider]  chlorthalidone (HYGROTON) 25 MG tablet Take 12.5 mg by mouth daily.  Yes [provider]  Cholecalciferol (VITAMIN D3) 50 MCG (2000 UT) TABS Take 2,000 Units by mouth daily.   Yes [provider]  Cyanocobalamin (B-12) 2500 MCG TABS Take 2,500 mcg by mouth daily.   Yes [provider]  finasteride (PROSCAR) 5 MG tablet Take 5 mg by mouth daily.   Yes [provider]  fluticasone (FLONASE) 50 MCG/ACT nasal spray INSTILL 1 SPRAY INTO EACH NOSTRIL ONCE DAILY AS DIRECTED Patient taking differently: Place 1 spray into both nostrils in the morning. 07/05/22  Yes Martina Sinner, MD  fluticasone-salmeterol (ADVAIR) 100-50 MCG/ACT AEPB Inhale 1 puff into the lungs in the  morning.   Yes [provider]  ipratropium (ATROVENT) 0.03 % nasal spray INSTILL 2 SPRAYS INTO EACH NOSTRIL EVERY 12 HOURS AS DIRECTED Patient taking differently: Place 2 sprays into both nostrils every 12 (twelve) hours. 12/01/22  Yes Martina Sinner, MD  NON FORMULARY Take 1 tablet by mouth See admin instructions. Vision Vite - Take 1 tablet by mouth with breakfast   Yes [provider]  polyethylene glycol (MIRALAX / GLYCOLAX) packet Take 17 g by mouth daily as needed for mild constipation (mix as directed).   Yes [provider]  rosuvastatin (CRESTOR) 20 MG tablet Take 20 mg by mouth daily.   Yes [provider]  Wheat Dextrin (BENEFIBER) POWD Take 4 g by mouth See admin instructions. Mix 4 grams (1 teaspoonful) of powder into 4-8 ounces of water and drink once a day   Yes [provider]  fexofenadine (ALLEGRA ALLERGY) 180 MG tablet Take 1 tablet (180 mg total) by mouth daily. Patient not taking: Reported on 01/20/2023 10/27/21   Martina Sinner, MD    Physical Exam: BP 132/74   Pulse 68   Temp 97.9 F (36.6 C) (Oral)   Resp 13   Wt 58 kg   SpO2 (!) 88%   BMI 20.03 kg/m   General:  Alert, oriented, calm, in no acute distress, resting comfortably on room air, looks his stated age.  Hard of hearing. Eyes: EOMI, clear conjuctivae, white sclerea Neck: supple, no masses, trachea mildline  Cardiovascular: Irregularly irregular, no murmurs or rubs, he has 1+ pitting edema in the bilateral ankles Respiratory: clear to auscultation bilaterally, no wheezes, no crackles  Abdomen: soft, nontender, nondistended, normal bowel tones heard  Skin: dry, no rashes  Musculoskeletal: no joint effusions, normal range of motion  Psychiatric: appropriate affect, normal speech  Neurologic: extraocular muscles intact, clear speech, moving all extremities with intact sensorium         Labs on Admission:  Basic Metabolic Panel: Recent Labs  Lab  01/20/23 1347  NA 136  K 3.9  CL 102  CO2 27  GLUCOSE 115*  BUN 35*  CREATININE 1.32*  CALCIUM 9.4   Liver Function Tests: Recent Labs  Lab 01/20/23 1347  AST 20  ALT 19  ALKPHOS 59  BILITOT 0.6  PROT 6.7  ALBUMIN 3.8   No results for input(s): "LIPASE", "AMYLASE" in the last 168 hours. No results for input(s): "AMMONIA" in the last 168 hours. CBC: Recent Labs  Lab 01/20/23 1347  WBC 6.6  NEUTROABS 5.3  HGB 11.5*  HCT 35.8*  MCV 99.4  PLT 231   Cardiac Enzymes: No results for input(s): "CKTOTAL", "CKMB", "CKMBINDEX", "TROPONINI" in the last 168 hours.  BNP (last 3 results) Recent Labs    01/20/23 1347  BNP 186.9*    ProBNP (last 3 results) No results  for input(s): "PROBNP" in the last 8760 hours.  CBG: No results for input(s): "GLUCAP" in the last 168 hours.  Radiological Exams on Admission: CT Angio Chest PE W and/or Wo Contrast  Result Date: 01/20/2023 CLINICAL DATA:  Positive D-dimer level.  Dyspnea. EXAM: CT ANGIOGRAPHY CHEST WITH CONTRAST TECHNIQUE: Multidetector CT imaging of the chest was performed using the standard protocol during bolus administration of intravenous contrast. Multiplanar CT image reconstructions and MIPs were obtained to evaluate the vascular anatomy. RADIATION DOSE REDUCTION: This exam was performed according to the departmental dose-optimization program which includes automated exposure control, adjustment of the mA and/or kV according to patient size and/or use of iterative reconstruction technique. CONTRAST:  75mL OMNIPAQUE IOHEXOL 350 MG/ML SOLN COMPARISON:  December 17, 2022. FINDINGS: Cardiovascular: Satisfactory opacification of the pulmonary arteries to the segmental level. No evidence of pulmonary embolism. Normal heart size. No pericardial effusion. Coronary artery calcifications are noted. Mediastinum/Nodes: No enlarged mediastinal, hilar, or axillary lymph nodes. Thyroid gland, trachea, and esophagus demonstrate no  significant findings. Lungs/Pleura: No pneumothorax or pleural effusion is noted. Minimal biapical scarring is noted. Stable 5 mm nodule seen in left upper lobe best seen on image number 41 of series 12. Continued presence of opacity in right lower lobe consistent with radiation change. However, there are noted increased opacity seen involving the right middle lobe anterior to the major fissure suggesting possible inflammation or possibly malignancy. This area measures 11 x 7 mm best seen on image number 99 of series 12. Upper Abdomen: No acute abnormality. Musculoskeletal: No chest wall abnormality. No acute or significant osseous findings. Review of the MIP images confirms the above findings. IMPRESSION: No definite evidence of pulmonary embolus. Continued presence of opacity seen in right lower lobe consistent with radiation change. There is noted increased irregular density seen involving the right middle lobe anterior to the major fissure which measures 11 x 7 mm, concerning for possible inflammation or malignancy. Follow-up unenhanced chest CT in 2-3 weeks is recommended to ensure resolution or stability. Stable 5 mm nodule seen in left upper lobe. Coronary artery calcifications are noted suggesting coronary artery disease. Aortic Atherosclerosis (ICD10-I70.0). Electronically Signed   By: Lupita Raider M.D.   On: 01/20/2023 16:45   DG Chest Portable 1 View  Result Date: 01/20/2023 CLINICAL DATA:  Dyspnea.  Tachycardia. EXAM: PORTABLE CHEST 1 VIEW COMPARISON:  CT scan chest from 12/15/2022. FINDINGS: There is an approximately 11 x 17 mm opacity overlying the right lower lung zone with several linear areas of atelectasis/scarring along its lateral aspect reaching up to the pleural surface. This corresponds to the nodule seen on the prior exam from 12/15/2022. Bilateral lung fields are otherwise clear. No acute consolidation or lung collapse. No pulmonary edema. Bilateral costophrenic angles are clear. No  pneumothorax. Normal cardio-mediastinal silhouette. No acute osseous abnormalities. The soft tissues are within normal limits. IMPRESSION: *Nodule overlying the right lower lung zone, corresponds to the opacity seen on the prior CT scan from 12/15/2022. *Otherwise no acute cardiopulmonary abnormality. Electronically Signed   By: Jules Schick M.D.   On: 01/20/2023 16:40    Assessment/Plan Maurisio Vorhies. is a 89 y.o. male with medical history significant for hypertension, hyperlipidemia being admitted to the hospital with new onset atrial fibrillation with RVR.   Atrial fibrillation with RVR-patient has no prior significant cardiac history other than hypertension.  Essentially asymptomatic so unclear how long this has been going on. -Observation admission to progressive -Continuous cardiac telemetry -Eliquis started  in the emergency department, will continue; discussed risks and benefits with the patient and his daughter at the bedside.  Despite his advanced age, they have elected for anticoagulation given his overall activity level and quality of life.  He has had no significant falls with injury, no history of significant bleeding. -IV Cardizem drip -Check TSH, magnesium -Check 2D echo  Hypertension-continue home chlorthalidone, holding amlodipine for now in the setting of slightly low blood pressures with Cardizem drip  Hyperlipidemia-Crestor  Elevated troponin-very mild in the setting of RVR, likely due to demand, will continue to trend and follow-up echo  Right middle lobe density/irregularity-will need noncontrast CT chest follow-up in a couple of months  DVT prophylaxis: Lovenox     Code Status: Full Code  Consults called: Cardiology  Admission status: Observation  Time spent: 64 minutes  Beauden Tremont Sharlette Dense MD [facility] Pager [phone]  If 7PM-7AM, please contact night-coverage [email or web address] Password [facility]  01/20/2023, 5:31 PM

## 2023-01-20 NOTE — Discharge Instructions (Signed)

## 2023-01-20 NOTE — ED Triage Notes (Signed)
C/o weakness and HR on machine at home 144bpm when checking vitals.  Denies fever, cp, sob.  Patient ambulatory to triage.

## 2023-01-20 NOTE — ED Provider Notes (Signed)
[facility] AT [facility] Provider Note  CSN: [id number] Arrival date & time: 01/20/23 1327  Chief Complaint(s) Tachycardia  HPI [name]. is a 89 y.o. male with PMH HTN, HLD, gout who presents emergency room for evaluation of tachycardia and fatigue.  History obtained from patient and patient's family who states that symptoms began last night with some mild shortness of breath and generalized fatigue.  He took his blood pressure at home and found that his heart rate was in the 140s.  Spoke with his primary physician who recommended that he come to the Emergency Department for further evaluation.  Here in the emergency department he is denying chest pain, abdominal pain, nausea, vomiting, headache, fever or other systemic symptoms.  No blood thinner use.   Past Medical History Past Medical History:  Diagnosis Date   Anemia    Arthritis    Cataract    Complication following bilateral mastoidectomy    Dysphagia    with large pills   Gout    HLD (hyperlipidemia)    HTN (hypertension)    Lung nodule 02/19/2020   Melanoma (HCC) 2009   Left forehead   Right posterior capsular opacification 06/21/2019   Wrist fracture    left   Patient Active Problem List   Diagnosis Date Noted   History of vitrectomy 08/24/2021   Left epiretinal membrane 08/24/2021   Primary open angle glaucoma of left eye, severe stage 05/19/2020   Malignant neoplasm of bronchus of right lower lobe (HCC) 03/06/2020   Right epiretinal membrane 06/21/2019   Intermediate stage nonexudative age-related macular degeneration of left eye 06/21/2019   Intermediate stage nonexudative age-related macular degeneration of right eye 06/21/2019   Quadriceps tendon rupture, right, subsequent encounter 08/23/2018   Quadriceps tendon rupture, right, initial encounter 08/23/2018   OA (osteoarthritis) of hip 05/05/2016   MGUS (monoclonal gammopathy of unknown significance) 08/10/2011    ANEMIA-UNSPECIFIED 01/15/2010   Constipation 01/15/2010   History of colonic polyps 01/15/2010   Home Medication(s) Prior to Admission medications   Medication Sig Start Date End Date Taking? Authorizing Provider  amLODipine (NORVASC) 2.5 MG tablet Take 2.5 mg by mouth daily.    [provider]  Amlodipine Besylate POWD amlodipine besylate (bulk)    [provider]  aspirin EC 81 MG tablet Take 81 mg by mouth daily.    [provider]  calcium carbonate (TUMS - DOSED IN MG ELEMENTAL CALCIUM) 500 MG chewable tablet Chew 1-2 tablets by mouth as needed for indigestion or heartburn.    [provider]  Calcium-Vitamin D-Vitamin K (VIACTIV CALCIUM PLUS D) 650-12.5-40 MG-MCG-MCG CHEW Chew 1 each by mouth daily.    [provider]  Camphor-Eucalyptus-Menthol (CHEST RUB) 4.8-1.2-2.6 % OINT Vicks Vaporub    [provider]  chlorthalidone (HYGROTON) 25 MG tablet Take 12.5 mg by mouth daily.    [provider]  Cholecalciferol (VITAMIN D) 50 MCG (2000 UT) tablet Take 2,000 Units by mouth daily.    [provider]  Cyanocobalamin (B-12) 2500 MCG TABS Take 2,500 mcg by mouth daily.    [provider]  famotidine (PEPCID) 20 MG tablet Take 1 tablet (20 mg total) by mouth at bedtime. 07/02/22   Martina Sinner, MD  fexofenadine (ALLEGRA ALLERGY) 180 MG tablet Take 1 tablet (180 mg total) by mouth daily. 10/27/21   Martina Sinner, MD  finasteride (PROSCAR) 5 MG tablet Take 5 mg by mouth daily.    [provider]  fluticasone (FLONASE) 50 MCG/ACT nasal spray INSTILL 1 SPRAY INTO EACH NOSTRIL ONCE DAILY AS DIRECTED 07/05/22   Martina Sinner, MD  fluticasone-salmeterol (ADVAIR) 100-50 MCG/ACT AEPB Inhale 1 puff into the lungs 2 (two) times daily.    [provider]  ipratropium (ATROVENT) 0.03 % nasal spray INSTILL 2 SPRAYS INTO EACH NOSTRIL EVERY 12 HOURS AS DIRECTED 12/01/22   Martina Sinner, MD   Multiple Minerals-Vitamins (BONE ESSENTIALS) CAPS calcium    [provider]  NON FORMULARY Take 1 tablet by mouth daily. Vision Vite - 1 tablet daily    [provider]  Omega-3 Fatty Acids (RA FISH OIL) 1000 MG CAPS Fish Oil    [provider]  polyethylene glycol (MIRALAX / GLYCOLAX) packet Take 17 g by mouth daily as needed for mild constipation.     [provider]  rosuvastatin (CRESTOR) 5 MG tablet Take 5 mg by mouth daily.    [provider]  Wheat Dextrin (BENEFIBER) POWD Take 10 mLs by mouth daily. 2 tsp Daily    [provider]                                                                                                                                    Past Surgical History Past Surgical History:  Procedure Laterality Date   cataracts     MASTOIDECTOMY REVISION Bilateral years ago   ORIF WRIST FRACTURE Left 1975   QUADRICEPS TENDON REPAIR Right 08/23/2018   Procedure: REPAIR QUADRICEP TENDON;  Surgeon: Ollen Gross, MD;  Location: [facility];  Service: Orthopedics;  Laterality: Right;    TOTAL HIP ARTHROPLASTY Left 05/05/2016   Procedure: LEFT TOTAL HIP ARTHROPLASTY ANTERIOR APPROACH;  Surgeon: Ollen Gross, MD;  Location: [facility];  Service: Orthopedics;  Laterality: Left;   Family History Family History  Problem Relation Age of Onset   Pneumonia Mother    Heart attack Father    Breast cancer Sister    Heart attack Nephew 66   Heart failure Sister    Kidney Stones Daughter    Esophageal cancer Neg Hx    Colon cancer Neg Hx    Rectal cancer Neg Hx    Stomach cancer Neg Hx     Social History Social History   Tobacco Use   Smoking status: Never   Smokeless tobacco: Never  Vaping Use   Vaping status: Never Used  Substance Use Topics   Alcohol use: No   Drug use: No   Allergies Other  Review of Systems Review of Systems  Constitutional:  Positive for fatigue.  Respiratory:  Positive for shortness of  breath.   Cardiovascular:  Positive for palpitations.    Physical Exam Vital Signs  I have reviewed the triage vital signs BP 101/87   Pulse (!) 114   Temp 97.9 F (36.6 C) (Oral)   Resp 13   Wt 58  kg   SpO2 100%   BMI 20.03 kg/m   Physical Exam Constitutional:      General: He is not in acute distress.    Appearance: Normal appearance.  HENT:     Head: Normocephalic and atraumatic.     Nose: No congestion or rhinorrhea.  Eyes:     General:        Right eye: No discharge.        Left eye: No discharge.     Extraocular Movements: Extraocular movements intact.     Pupils: Pupils are equal, round, and reactive to light.  Cardiovascular:     Rate and Rhythm: Tachycardia present. Rhythm irregular.     Heart sounds: No murmur heard. Pulmonary:     Effort: No respiratory distress.     Breath sounds: No wheezing or rales.  Abdominal:     General: There is no distension.     Tenderness: There is no abdominal tenderness.  Musculoskeletal:        General: Normal range of motion.     Cervical back: Normal range of motion.  Skin:    General: Skin is warm and dry.  Neurological:     General: No focal deficit present.     Mental Status: He is alert.     ED Results and Treatments Labs (all labs ordered are listed, but only abnormal results are displayed) Labs Reviewed  COMPREHENSIVE METABOLIC PANEL - Abnormal; Notable for the following components:      Result Value   Glucose, Bld 115 (*)    BUN 35 (*)    Creatinine, Ser 1.32 (*)    GFR, Estimated 49 (*)    All other components within normal limits  CBC WITH DIFFERENTIAL/PLATELET - Abnormal; Notable for the following components:   RBC 3.60 (*)    Hemoglobin 11.5 (*)    HCT 35.8 (*)    All other components within normal limits  D-DIMER, QUANTITATIVE - Abnormal; Notable for the following components:   D-Dimer, Quant 0.81 (*)    All other components within normal limits  BRAIN NATRIURETIC PEPTIDE - Abnormal; Notable  for the following components:   B Natriuretic Peptide 186.9 (*)    All other components within normal limits  TROPONIN I (HIGH SENSITIVITY) - Abnormal; Notable for the following components:   Troponin I (High Sensitivity) 94 (*)    All other components within normal limits                                                                                                                          Radiology No results found.  Pertinent labs & imaging results that were available during my care of the patient were reviewed by me and considered in my medical decision making (see MDM for details).  Medications Ordered in ED Medications  diltiazem (CARDIZEM) 1 mg/mL load via infusion 10 mg (10 mg Intravenous Bolus from Bag 01/20/23 1423)    And  diltiazem (CARDIZEM)  125 mg in dextrose 5% 125 mL (1 mg/mL) infusion (5 mg/hr Intravenous New Bag/Given 01/20/23 1423)  lactated ringers bolus 1,000 mL (1,000 mLs Intravenous New Bag/Given 01/20/23 1351)  diltiazem (CARDIZEM) injection 10 mg (10 mg Intravenous Given 01/20/23 1351)  iohexol (OMNIPAQUE) 350 MG/ML injection 75 mL (75 mLs Intravenous Contrast Given 01/20/23 1504)                                                                                                                                     Procedures .Critical Care  Performed by: Glendora Score, MD Authorized by: Glendora Score, MD   Critical care provider statement:    Critical care time (minutes):  30   Critical care was necessary to treat or prevent imminent or life-threatening deterioration of the following conditions:  Cardiac failure   Critical care was time spent personally by me on the following activities:  Development of treatment plan with patient or surrogate, discussions with consultants, evaluation of patient's response to treatment, examination of patient, ordering and review of laboratory studies, ordering and review of radiographic studies, ordering and performing  treatments and interventions, pulse oximetry, re-evaluation of patient's condition and review of old charts   (including critical care time)  Medical Decision Making / ED Course   This patient presents to the ED for concern of palpitations, shortness of breath, this involves an extensive number of treatment options, and is a complaint that carries with it a high risk of complications and morbidity.  The differential diagnosis includes A-fib, a flutter, SVT, CAD, ACS, PE, pneumonia, fluid overload, electrolyte abnormality  MDM: Patient seen emergency room for evaluation of shortness of breath and rapid heart rate.  Physical exam with a regular tachycardia in the 140s, lower extremity pitting edema left greater than right.  Initial ECG concerning for a flutter 2 1 versus slow SVT.  Diltiazem bolus did slow the heart rate and showed appropriate regularity and underlying rhythm likely A-fib/flutter.  Started on diltiazem drip for rate control.  Laboratory evaluation with BUN 35, creatinine 1.32, hemoglobin 11.5, D-dimer 0.81, BNP 186.9, initial troponin 94 likely type II demand ischemia in the setting of rapid heart rate.  Chest x-ray with a nodule in the right lower lobe.  With elevated D-dimer and new onset A-fib, PE study obtained that was reassuringly negative for PE but does show an increased irregular density in the right middle lobe concerning for possible inflammation versus malignancy.  Cardiology consulted who recommended Eliquis initiation and will evaluate the patient inpatient.  Questions and admitted   Additional history obtained: -Additional history obtained from multiple family numbers -External records from outside source obtained and reviewed including: Chart review including previous notes, labs, imaging, consultation notes   Lab Tests: -I ordered, reviewed, and interpreted labs.   The pertinent results include:   Labs Reviewed  COMPREHENSIVE METABOLIC PANEL - Abnormal; Notable  for the following components:  Result Value   Glucose, Bld 115 (*)    BUN 35 (*)    Creatinine, Ser 1.32 (*)    GFR, Estimated 49 (*)    All other components within normal limits  CBC WITH DIFFERENTIAL/PLATELET - Abnormal; Notable for the following components:   RBC 3.60 (*)    Hemoglobin 11.5 (*)    HCT 35.8 (*)    All other components within normal limits  D-DIMER, QUANTITATIVE - Abnormal; Notable for the following components:   D-Dimer, Quant 0.81 (*)    All other components within normal limits  BRAIN NATRIURETIC PEPTIDE - Abnormal; Notable for the following components:   B Natriuretic Peptide 186.9 (*)    All other components within normal limits  TROPONIN I (HIGH SENSITIVITY) - Abnormal; Notable for the following components:   Troponin I (High Sensitivity) 94 (*)    All other components within normal limits      EKG   EKG Interpretation Date/Time:  Thursday January 20 2023 13:37:29 EST Ventricular Rate:  144 PR Interval:  88 QRS Duration:  72 QT Interval:  295 QTC Calculation: 457 R Axis:   -28  Text Interpretation: Aflutter 2:1 Confirmed by Ethon Wymer ([id number]) on 01/20/2023 11:00:45 PM         Imaging Studies ordered: I ordered imaging studies including chest x-ray, the study I independently visualized and interpreted imaging. I agree with the radiologist interpretation   Medicines ordered and prescription drug management: Meds ordered this encounter  Medications   lactated ringers bolus 1,000 mL   diltiazem (CARDIZEM) injection 10 mg   AND Linked Order Group    diltiazem (CARDIZEM) 1 mg/mL load via infusion 10 mg    diltiazem (CARDIZEM) 125 mg in dextrose 5% 125 mL (1 mg/mL) infusion   iohexol (OMNIPAQUE) 350 MG/ML injection 75 mL    -I have reviewed the patients home medicines and have made adjustments as needed  Critical interventions Diltiazem bolus and infusion, cardiology consultation, Eliquis  Consultations Obtained: I requested  consultation with the cardiology team,  and discussed lab and imaging findings as well as pertinent plan - they recommend: Eliquis initiation and inpatient evaluation by cardiology   Cardiac Monitoring: The patient was maintained on a cardiac monitor.  I personally viewed and interpreted the cardiac monitored which showed an underlying rhythm of: Atrial fibrillation with RVR/a flutter  Social Determinants of Health:  Factors impacting patients care include: none   Reevaluation: After the interventions noted above, I reevaluated the patient and found that they have :improved  Co morbidities that complicate the patient evaluation  Past Medical History:  Diagnosis Date   Anemia    Arthritis    Cataract    Complication following bilateral mastoidectomy    Dysphagia    with large pills   Gout    HLD (hyperlipidemia)    HTN (hypertension)    Lung nodule 02/19/2020   Melanoma (HCC) 2009   Left forehead   Right posterior capsular opacification 06/21/2019   Wrist fracture    left      Dispostion: I considered admission for this patient, and given new onset rapid tachycardia patient require hospital admission     Final Clinical Impression(s) / ED Diagnoses Final diagnoses:  None     @PCDICTATION @    Glendora Score, MD 01/20/23 [id number]

## 2023-01-21 ENCOUNTER — Observation Stay (HOSPITAL_COMMUNITY): Payer: Medicare Other

## 2023-01-21 DIAGNOSIS — E785 Hyperlipidemia, unspecified: Secondary | ICD-10-CM | POA: Diagnosis present

## 2023-01-21 DIAGNOSIS — H401123 Primary open-angle glaucoma, left eye, severe stage: Secondary | ICD-10-CM | POA: Diagnosis present

## 2023-01-21 DIAGNOSIS — Z923 Personal history of irradiation: Secondary | ICD-10-CM | POA: Diagnosis not present

## 2023-01-21 DIAGNOSIS — R9389 Abnormal findings on diagnostic imaging of other specified body structures: Secondary | ICD-10-CM

## 2023-01-21 DIAGNOSIS — I484 Atypical atrial flutter: Principal | ICD-10-CM | POA: Diagnosis present

## 2023-01-21 DIAGNOSIS — R911 Solitary pulmonary nodule: Secondary | ICD-10-CM | POA: Diagnosis present

## 2023-01-21 DIAGNOSIS — E876 Hypokalemia: Secondary | ICD-10-CM | POA: Diagnosis present

## 2023-01-21 DIAGNOSIS — Z7951 Long term (current) use of inhaled steroids: Secondary | ICD-10-CM | POA: Diagnosis not present

## 2023-01-21 DIAGNOSIS — M109 Gout, unspecified: Secondary | ICD-10-CM | POA: Diagnosis present

## 2023-01-21 DIAGNOSIS — Z7901 Long term (current) use of anticoagulants: Secondary | ICD-10-CM | POA: Diagnosis not present

## 2023-01-21 DIAGNOSIS — I2510 Atherosclerotic heart disease of native coronary artery without angina pectoris: Secondary | ICD-10-CM | POA: Diagnosis present

## 2023-01-21 DIAGNOSIS — Z7982 Long term (current) use of aspirin: Secondary | ICD-10-CM | POA: Diagnosis not present

## 2023-01-21 DIAGNOSIS — I10 Essential (primary) hypertension: Secondary | ICD-10-CM | POA: Diagnosis present

## 2023-01-21 DIAGNOSIS — K219 Gastro-esophageal reflux disease without esophagitis: Secondary | ICD-10-CM | POA: Diagnosis present

## 2023-01-21 DIAGNOSIS — Z96642 Presence of left artificial hip joint: Secondary | ICD-10-CM | POA: Diagnosis present

## 2023-01-21 DIAGNOSIS — Z8249 Family history of ischemic heart disease and other diseases of the circulatory system: Secondary | ICD-10-CM | POA: Diagnosis not present

## 2023-01-21 DIAGNOSIS — Z803 Family history of malignant neoplasm of breast: Secondary | ICD-10-CM | POA: Diagnosis not present

## 2023-01-21 DIAGNOSIS — C3431 Malignant neoplasm of lower lobe, right bronchus or lung: Secondary | ICD-10-CM | POA: Diagnosis present

## 2023-01-21 DIAGNOSIS — I2489 Other forms of acute ischemic heart disease: Secondary | ICD-10-CM

## 2023-01-21 DIAGNOSIS — I4891 Unspecified atrial fibrillation: Secondary | ICD-10-CM

## 2023-01-21 DIAGNOSIS — R1310 Dysphagia, unspecified: Secondary | ICD-10-CM | POA: Diagnosis present

## 2023-01-21 DIAGNOSIS — Z85820 Personal history of malignant melanoma of skin: Secondary | ICD-10-CM | POA: Diagnosis not present

## 2023-01-21 DIAGNOSIS — J45909 Unspecified asthma, uncomplicated: Secondary | ICD-10-CM | POA: Diagnosis present

## 2023-01-21 DIAGNOSIS — R000 Tachycardia, unspecified: Secondary | ICD-10-CM | POA: Diagnosis present

## 2023-01-21 DIAGNOSIS — Z79899 Other long term (current) drug therapy: Secondary | ICD-10-CM | POA: Diagnosis not present

## 2023-01-21 LAB — CBC
HCT: 36.3 % — ABNORMAL LOW (ref 39.0–52.0)
Hemoglobin: 11.4 g/dL — ABNORMAL LOW (ref 13.0–17.0)
MCH: 31.4 pg (ref 26.0–34.0)
MCHC: 31.4 g/dL (ref 30.0–36.0)
MCV: 100 fL (ref 80.0–100.0)
Platelets: 236 10*3/uL (ref 150–400)
RBC: 3.63 MIL/uL — ABNORMAL LOW (ref 4.22–5.81)
RDW: 13.2 % (ref 11.5–15.5)
WBC: 8.9 10*3/uL (ref 4.0–10.5)
nRBC: 0 % (ref 0.0–0.2)

## 2023-01-21 LAB — ECHOCARDIOGRAM COMPLETE
AR max vel: 1.28 cm2
AV Area VTI: 1.37 cm2
AV Area mean vel: 1.42 cm2
AV Mean grad: 11 mm[Hg]
AV Peak grad: 21.2 mm[Hg]
Ao pk vel: 2.3 m/s
Height: 67 in
Weight: 2134.05 [oz_av]

## 2023-01-21 LAB — BASIC METABOLIC PANEL
Anion gap: 9 (ref 5–15)
BUN: 28 mg/dL — ABNORMAL HIGH (ref 8–23)
CO2: 28 mmol/L (ref 22–32)
Calcium: 9.5 mg/dL (ref 8.9–10.3)
Chloride: 101 mmol/L (ref 98–111)
Creatinine, Ser: 1.19 mg/dL (ref 0.61–1.24)
GFR, Estimated: 56 mL/min — ABNORMAL LOW (ref >60)
Glucose, Bld: 143 mg/dL — ABNORMAL HIGH (ref 70–99)
Potassium: 3.3 mmol/L — ABNORMAL LOW (ref 3.5–5.1)
Sodium: 138 mmol/L (ref 135–145)

## 2023-01-21 LAB — TROPONIN I (HIGH SENSITIVITY): Troponin I (High Sensitivity): 108 ng/L (ref <18)

## 2023-01-21 LAB — MAGNESIUM: Magnesium: 2 mg/dL (ref 1.7–2.4)

## 2023-01-21 MED ORDER — VITAMIN B-12 1000 MCG PO TABS
2000.0000 ug | ORAL_TABLET | Freq: Every day | ORAL | Status: DC
  Administered 2023-01-21 – 2023-01-22 (×2): 2000 ug via ORAL
  Filled 2023-01-21 (×2): qty 2

## 2023-01-21 MED ORDER — CALCIUM CARBONATE ANTACID 500 MG PO CHEW: 1.0000 | CHEWABLE_TABLET | ORAL | Status: DC | PRN

## 2023-01-21 MED ORDER — VITAMIN D3 25 MCG (1000 UNIT) PO TABS
2000.0000 [IU] | ORAL_TABLET | Freq: Every day | ORAL | Status: DC
  Administered 2023-01-21 – 2023-01-22 (×2): 2000 [IU] via ORAL
  Filled 2023-01-21 (×2): qty 2

## 2023-01-21 MED ORDER — FINASTERIDE 5 MG PO TABS
5.0000 mg | ORAL_TABLET | Freq: Every day | ORAL | Status: DC
  Administered 2023-01-21 – 2023-01-22 (×2): 5 mg via ORAL
  Filled 2023-01-21 (×2): qty 1

## 2023-01-21 MED ORDER — DILTIAZEM HCL 30 MG PO TABS
30.0000 mg | ORAL_TABLET | Freq: Four times a day (QID) | ORAL | Status: DC
  Administered 2023-01-22: 30 mg via ORAL
  Filled 2023-01-21: qty 1

## 2023-01-21 MED ORDER — BENEFIBER PO POWD: 4.0000 g | ORAL | Status: DC

## 2023-01-21 MED ORDER — POLYETHYLENE GLYCOL 3350 17 G PO PACK: 17.0000 g | PACK | Freq: Every day | ORAL | Status: DC | PRN

## 2023-01-21 MED ORDER — CALCIUM-VITAMIN D-VITAMIN K 650-12.5-40 MG-MCG-MCG PO CHEW: 1.0000 | CHEWABLE_TABLET | Freq: Every day | ORAL | Status: DC

## 2023-01-21 MED ORDER — POTASSIUM CHLORIDE CRYS ER 20 MEQ PO TBCR
40.0000 meq | EXTENDED_RELEASE_TABLET | Freq: Two times a day (BID) | ORAL | Status: AC
  Administered 2023-01-21 (×2): 40 meq via ORAL
  Filled 2023-01-21 (×2): qty 2

## 2023-01-21 MED ORDER — DILTIAZEM HCL 30 MG PO TABS
30.0000 mg | ORAL_TABLET | Freq: Four times a day (QID) | ORAL | Status: AC
  Administered 2023-01-21 (×3): 30 mg via ORAL
  Filled 2023-01-21 (×3): qty 1

## 2023-01-21 NOTE — Consult Note (Signed)
Cardiology Consultation   Patient ID: [name]. MRN: [id number]; DOB: 1924/10/27  Admit date: 01/20/2023 Date of Consult: 01/21/2023  PCP:  [name], MD   [facility] Providers Cardiologist:  New  Click here to update MD or APP on Care Team, Refresh:1}     Patient Profile:   [name]. is a 89 y.o. Ramsey with a history of coronary artery calcifications noted on prior CT scans, hypertension, hyperlipidemia, asthma, GERD, gout, and piror lung cancer s/p radiation who is being seen 01/21/2023 for the evaluation of new onset atrial flutter at the request of Dr. Audrie Lia.  History of Present Illness:   [name] is a 89 year old Ramsey with the above history. He has no known cardiac history although coronary artery calcifications have been noted on prior chest CTs for routine monitoring of his lung cancer.  He presented to the [facility] ED on 01/20/2023 for evaluation of weakness and tachycardia. Patient was in his usual state of health until the morning of 01/20/2023 when he woke up and felt very weak and tired. He normally check his BP/ HR every morning and when he checked his vitals, he noticed his HR was in the 140s which is why he came to the ED. He denies any palpitations and was otherwise asymptomatic. No chest pain or shortness of breath. He sleep on a incline mainly due to sinus drainage/ productive cough but this is not new. No orthopnea, PND, or edema. No recent fevers or illnesses. He is a very healthy 89 year old. No falls or syncope. No abnormal bleeding.  Upon arrival to the ED, he was noted to be tachycardic with heart rate in the 140s but otherwise vitals stable. EKG showed atrial flutter with 2:1 AV conduction, rate 144 bpm, with mild diffuse ST depressions (likely rate related).. High-sensitivity troponin 94 >> 112. D-dimer negative when adjusted for age. BNP mildly elevated at 186. Chest x-ray showed a lung nodule overlying the right lower lung zone  which was seen on prior CT but no acute findings. Chest CTA was negative for PE but did showed increased irregular density in the right middle lobe anterior to the major fissure concerning for possible inflammation or malignancy as well as a stable 5mm nodule seen in the left upper lobe. WBC 6.6, Hgb 11.5, Plts 231. Na 136, K 3.9, Glucose 115, BUN 35, Cr 1.32.  LFTs normal. Magnesium 1.9. TSH normal.  He was started on IV Diltiazem and given a bolus of LR. He was also started on Eliquis and admitted. Cardiology consulted for further evaluation.  At the time of this evaluation, patient is still in atrial fibrillation but rate has improved on the Diltiazem. Currently in 4:1 AV conduction with rates in the 70s.  He denies any history of tobacco abuse. He does have a family history of heart disease with his father dying at 63 from a MI.  Past Medical History:  Diagnosis Date   Anemia    Arthritis    Cataract    Complication following bilateral mastoidectomy    Dysphagia    with large pills   Gout    HLD (hyperlipidemia)    HTN (hypertension)    Lung nodule 02/19/2020   Melanoma (HCC) 2009   Left forehead   Right posterior capsular opacification 06/21/2019   Wrist fracture    left    Past Surgical History:  Procedure Laterality Date   cataracts     MASTOIDECTOMY  REVISION Bilateral years ago   ORIF WRIST FRACTURE Left 1975   QUADRICEPS TENDON REPAIR Right 08/23/2018   Procedure: REPAIR QUADRICEP TENDON;  Surgeon: Ollen Gross, MD;  Location: [facility];  Service: Orthopedics;  Laterality: Right;    TOTAL HIP ARTHROPLASTY Left 05/05/2016   Procedure: LEFT TOTAL HIP ARTHROPLASTY ANTERIOR APPROACH;  Surgeon: Ollen Gross, MD;  Location: [facility];  Service: Orthopedics;  Laterality: Left;     Home Medications:  Prior to Admission medications   Medication Sig Start Date End Date Taking? Authorizing Provider  amLODipine (NORVASC) 2.5 MG tablet Take 2.5 mg by mouth daily.   Yes [provider]  calcium carbonate (TUMS - DOSED IN MG ELEMENTAL CALCIUM) 500 MG chewable tablet Chew 1-2 tablets by mouth as needed for indigestion or heartburn.   Yes [provider]  Calcium-Vitamin D-Vitamin K (VIACTIV CALCIUM PLUS D) 650-12.5-40 MG-MCG-MCG CHEW Chew 1 each by mouth daily.   Yes [provider]  Camphor-Eucalyptus-Menthol (CHEST RUB) 4.8-1.2-2.6 % OINT Apply 1 application  topically See admin instructions. Vick's vaporub- Apply to the chest nightly, then cover   Yes [provider]  chlorthalidone (HYGROTON) 25 MG tablet Take 12.5 mg by mouth daily.   Yes [provider]  Cholecalciferol (VITAMIN D3) 50 MCG (2000 UT) TABS Take 2,000 Units by mouth daily.   Yes [provider]  Cyanocobalamin (B-12) 2500 MCG TABS Take 2,500 mcg by mouth daily.   Yes [provider]  finasteride (PROSCAR) 5 MG tablet Take 5 mg by mouth daily.   Yes [provider]  fluticasone (FLONASE) 50 MCG/ACT nasal spray INSTILL 1 SPRAY INTO EACH NOSTRIL ONCE DAILY AS DIRECTED Patient taking differently: Place 1 spray into both nostrils in the morning. 07/05/22  Yes Martina Sinner, MD  fluticasone-salmeterol (ADVAIR) 100-50 MCG/ACT AEPB Inhale 1 puff into the lungs in the morning.   Yes [provider]  ipratropium (ATROVENT) 0.03 % nasal spray INSTILL 2 SPRAYS INTO EACH NOSTRIL EVERY 12 HOURS AS DIRECTED Patient taking differently: Place 2 sprays into both nostrils every 12 (twelve) hours. 12/01/22  Yes Martina Sinner, MD  NON FORMULARY Take 1 tablet by mouth See admin instructions. Vision Vite - Take 1 tablet by mouth with breakfast   Yes [provider]  polyethylene glycol (MIRALAX / GLYCOLAX) packet Take 17 g by mouth daily as needed for mild constipation (mix as directed).   Yes [provider]  rosuvastatin (CRESTOR) 20 MG tablet Take 20 mg by mouth daily.   Yes [provider]  Wheat Dextrin  (BENEFIBER) POWD Take 4 g by mouth See admin instructions. Mix 4 grams (1 teaspoonful) of powder into 4-8 ounces of water and drink once a day   Yes [provider]  fexofenadine (ALLEGRA ALLERGY) 180 MG tablet Take 1 tablet (180 mg total) by mouth daily. Patient not taking: Reported on 01/20/2023 10/27/21   Martina Sinner, MD    Inpatient Medications: Scheduled Meds:  apixaban  2.5 mg Oral BID   fluticasone  1 spray Each Nare q AM   ipratropium  2 spray Each Nare Q12H   mometasone-formoterol  2 puff Inhalation BID   potassium chloride  40 mEq Oral BID   rosuvastatin  20 mg Oral Daily   Continuous Infusions:  diltiazem (CARDIZEM) infusion 10 mg/hr (01/21/23 0019)   PRN Meds: acetaminophen **OR** acetaminophen, albuterol, ondansetron **OR** ondansetron (ZOFRAN) IV, traZODone  Allergies:    Allergies  Allergen Reactions  Other Other (See Comments)    Pt cannot take gel caps - med gets stuck in throat     Social History:   Social History   Socioeconomic History   Marital status: Widowed    Spouse name: Not on file   Number of children: 2   Years of education: Not on file   Highest education level: Not on file  Occupational History   Occupation: retired  Tobacco Use   Smoking status: Never   Smokeless tobacco: Never  Vaping Use   Vaping status: Never Used  Substance and Sexual Activity   Alcohol use: No   Drug use: No   Sexual activity: Not on file  Other Topics Concern   Not on file  Social History Narrative   Not on file   Social Determinants of Health   Financial Resource Strain: Not on file  Food Insecurity: No Food Insecurity (01/20/2023)   Hunger Vital Sign    Worried About Running Out of Food in the Last Year: Never true    Ran Out of Food in the Last Year: Never true  Transportation Needs: No Transportation Needs (01/20/2023)   PRAPARE - Administrator, Civil Service (Medical): No    Lack of Transportation (Non-Medical): No   Physical Activity: Not on file  Stress: Not on file  Social Connections: Not on file  Intimate Partner Violence: Not At Risk (01/20/2023)   Humiliation, Afraid, Rape, and Kick questionnaire    Fear of Current or Ex-Partner: No    Emotionally Abused: No    Physically Abused: No    Sexually Abused: No    Family History:   Family History  Problem Relation Age of Onset   Pneumonia Mother    Heart attack Father    Breast cancer Sister    Heart attack Nephew 66   Heart failure Sister    Kidney Stones Daughter    Esophageal cancer Neg Hx    Colon cancer Neg Hx    Rectal cancer Neg Hx    Stomach cancer Neg Hx      ROS:  Please see the history of present illness.  All other ROS reviewed and negative.     Physical Exam/Data:   Vitals:   01/20/23 2308 01/21/23 0302 01/21/23 0610 01/21/23 0836  BP: 133/74 101/60 99/61   Pulse: 73 73 74   Resp: 17 19 19    Temp: 98.4 F (36.9 C) 97.9 F (36.6 C) (!) 97.4 F (36.3 C)   TempSrc: Oral Oral Oral   SpO2: 95% 97% 97% 95%  Weight:      Height:        Intake/Output Summary (Last 24 hours) at 01/21/2023 0845 Last data filed at 01/21/2023 0100 Gross per 24 hour  Intake 1000 ml  Output 350 ml  Net 650 ml      01/20/2023    8:06 PM 01/20/2023    1:38 PM 07/02/2022    9:24 AM  Last 3 Weights  Weight (lbs) 133 lb 6.1 oz 127 lb 13.9 oz 128 lb  Weight (kg) 60.5 kg 58 kg 58.06 kg     Body mass index is 20.89 kg/m.  General: 89 y.o. thin Caucasian Ramsey resting comfortably in no acute distress. HEENT: Normocephalic and atraumatic. Sclera clear.  Neck: Supple. No carotid bruits. No JVD. Heart: RRR. II/VI systolic murmur at the apex. Lungs: No increased work of breathing. Decreased breath sounds in bases but no wheezes, rhonchi, or rales.  Abdomen:  Soft, non-distended, and non-tender to palpation.  Extremities: No lower extremity edema.    Skin: Warm and dry. Neuro: Alert and oriented x3. No focal deficits. Psych: Normal affect.  Responds appropriately.   EKG:  The EKG was personally reviewed and demonstrates:  Atrial flutter with 2:1 AV conduction, rate 144 bpm with mild diffuse ST depressions (likely rate related).  Telemetry:  Telemetry was personally reviewed and demonstrates:  Atrial flutter with 4:1 AV conduction. Rates in the 70s with occasional spike to the 90s to low 100s (suspect this was with ambulation).  Relevant CV Studies: N/A  Laboratory Data:  High Sensitivity Troponin:   Recent Labs  Lab 01/20/23 1347 01/20/23 1939 01/21/23 0140  TROPONINIHS 94* 112* 108*     Chemistry Recent Labs  Lab 01/20/23 1347 01/21/23 0140  NA 136 138  K 3.9 3.3*  CL 102 101  CO2 27 28  GLUCOSE 115* 143*  BUN 35* 28*  CREATININE 1.32* 1.19  CALCIUM 9.4 9.5  MG 1.9  --   GFRNONAA 49* 56*  ANIONGAP 7 9    Recent Labs  Lab 01/20/23 1347  PROT 6.7  ALBUMIN 3.8  AST 20  ALT 19  ALKPHOS 59  BILITOT 0.6   Lipids No results for input(s): "CHOL", "TRIG", "HDL", "LABVLDL", "LDLCALC", "CHOLHDL" in the last 168 hours.  Hematology Recent Labs  Lab 01/20/23 1347 01/21/23 0140  WBC 6.6 8.9  RBC 3.60* 3.63*  HGB 11.5* 11.4*  HCT 35.8* 36.3*  MCV 99.4 100.0  MCH 31.9 31.4  MCHC 32.1 31.4  RDW 13.2 13.2  PLT 231 236   Thyroid  Recent Labs  Lab 01/20/23 1347  TSH 2.054    BNP Recent Labs  Lab 01/20/23 1347  BNP 186.9*    DDimer  Recent Labs  Lab 01/20/23 1347  DDIMER 0.81*     Radiology/Studies:  CT Angio Chest PE W and/or Wo Contrast  Result Date: 01/20/2023 CLINICAL DATA:  Positive D-dimer level.  Dyspnea. EXAM: CT ANGIOGRAPHY CHEST WITH CONTRAST TECHNIQUE: Multidetector CT imaging of the chest was performed using the standard protocol during bolus administration of intravenous contrast. Multiplanar CT image reconstructions and MIPs were obtained to evaluate the vascular anatomy. RADIATION DOSE REDUCTION: This exam was performed according to the departmental dose-optimization  program which includes automated exposure control, adjustment of the mA and/or kV according to patient size and/or use of iterative reconstruction technique. CONTRAST:  75mL OMNIPAQUE IOHEXOL 350 MG/ML SOLN COMPARISON:  December 17, 2022. FINDINGS: Cardiovascular: Satisfactory opacification of the pulmonary arteries to the segmental level. No evidence of pulmonary embolism. Normal heart size. No pericardial effusion. Coronary artery calcifications are noted. Mediastinum/Nodes: No enlarged mediastinal, hilar, or axillary lymph nodes. Thyroid gland, trachea, and esophagus demonstrate no significant findings. Lungs/Pleura: No pneumothorax or pleural effusion is noted. Minimal biapical scarring is noted. Stable 5 mm nodule seen in left upper lobe best seen on image number 41 of series 12. Continued presence of opacity in right lower lobe consistent with radiation change. However, there are noted increased opacity seen involving the right middle lobe anterior to the major fissure suggesting possible inflammation or possibly malignancy. This area measures 11 x 7 mm best seen on image number 99 of series 12. Upper Abdomen: No acute abnormality. Musculoskeletal: No chest wall abnormality. No acute or significant osseous findings. Review of the MIP images confirms the above findings. IMPRESSION: No definite evidence of pulmonary embolus. Continued presence of opacity seen in right lower lobe consistent with radiation change.  There is noted increased irregular density seen involving the right middle lobe anterior to the major fissure which measures 11 x 7 mm, concerning for possible inflammation or malignancy. Follow-up unenhanced chest CT in 2-3 weeks is recommended to ensure resolution or stability. Stable 5 mm nodule seen in left upper lobe. Coronary artery calcifications are noted suggesting coronary artery disease. Aortic Atherosclerosis (ICD10-I70.0). Electronically Signed   By: Lupita Raider M.D.   On: 01/20/2023  16:45   DG Chest Portable 1 View  Result Date: 01/20/2023 CLINICAL DATA:  Dyspnea.  Tachycardia. EXAM: PORTABLE CHEST 1 VIEW COMPARISON:  CT scan chest from 12/15/2022. FINDINGS: There is an approximately 11 x 17 mm opacity overlying the right lower lung zone with several linear areas of atelectasis/scarring along its lateral aspect reaching up to the pleural surface. This corresponds to the nodule seen on the prior exam from 12/15/2022. Bilateral lung fields are otherwise clear. No acute consolidation or lung collapse. No pulmonary edema. Bilateral costophrenic angles are clear. No pneumothorax. Normal cardio-mediastinal silhouette. No acute osseous abnormalities. The soft tissues are within normal limits. IMPRESSION: *Nodule overlying the right lower lung zone, corresponds to the opacity seen on the prior CT scan from 12/15/2022. *Otherwise no acute cardiopulmonary abnormality. Electronically Signed   By: Jules Schick M.D.   On: 01/20/2023 16:40     Assessment and Plan:   New Onset Atrial Flutter Patient presented with weakness, fatigue, and tachycardia and was found to be in new onset atrial flutter with 2:1 AV conduction. Rates initially in the 140s. He was started on IV Diltiazem with improvement in rates. - Currently in atrial flutter with 4:1 AV conduction and rates in the 70s. - Potassium 3.9 on admission but 3.3 today. Will replete.  - Magnesium and TSH normal. - Echo pending. - Currently on IV Diltiazem. Will try to wean this off and start short acting PO Diltiazem 30mg  every 6 hours. Will plan to consolidate to long-acting Diltiazem prior to discharge. - CHA2DS2-VASc = 4 (coronary artery calcifications, HTN, age x2). He has been started on Eliquis 2.5mg  twice daily. He is 89 year old but he is overall a healthy 89 year old. He does not have any issues with falls. Therefore, OK to continue. - Discussed different treatment options including TEE/DCCV as an inpatient if unable to rate  control vs DCCV as an outpatient if able to rate control. Family states patient has some dysphagia at baseline and would not like the idea of a TEE. If we are able to switch him to PO medications and maintain rate control, can likely plan for outpatient DCCV after 3 weeks of uninterrupted anticoagulation.  Demand Ischemia Coronary Artery Calcifications He has had three vessel coronary artery calcifications noted on prior chest Cts for monitoring of his lung cancer. EKG shows some very mild diffuse ST depressions which I suspect is rate related. High-sensitivity troponin 94 >> 112 >> 108 consistent with demand ischemia.  - No chest pain. - No aspirin due to need for anticoagulation. - Continue statin. - Troponin trend is consistent with demand ischemia. He denies any anginal symptoms. Echo is pending but do no plans for ischemic evaluation at this time.  Hypertension BP soft but stable. - Continue Diltiazem as above. - Continue to hold home Amlodipine. Will also hold home Chlorthalidone to allow for more rate control.  Hyperlipidemia - Continue home Crestor 20mg  daily.  Hypokalemia Potassium 3.3 today. - Will replete.    Risk Assessment/Risk Scores:  CHA2DS2-VASc Score = 4  This indicates a 4.8% annual risk of stroke. The patient's score is based upon: CHF History: 0 HTN History: 1 Diabetes History: 0 Stroke History: 0 Vascular Disease History: 1 Age Score: 2 Gender Score: 0  For questions or updates, please contact [facility] Please consult [email or web address] for contact info under    Signed, Corrin Parker, PA-C  01/21/2023 8:45 AM

## 2023-01-21 NOTE — Progress Notes (Signed)
  Echocardiogram 2D Echocardiogram has been performed.  Milda Smart 01/21/2023, 11:46 AM

## 2023-01-21 NOTE — Progress Notes (Signed)
PROGRESS NOTE    [name].  LKG:[id number] DOB: [birth year] DOA: 01/20/2023 PCP: [name], MD    Chief Complaint  Patient presents with   Tachycardia    Brief Narrative:  Patient pleasant 89 year old active gentleman with history of hypertension, hyperlipidemia presented to the ED with new onset A-fib with RVR.  Patient placed on a Cardizem drip.  CT angiogram chest obtained negative for PE.  2D echo ordered.  Cardiology consulted.   Assessment & Plan:   Principal Problem:   Atypical atrial flutter (HCC) Active Problems:   Atrial fibrillation with RVR (HCC)   Hyperlipidemia   HTN (hypertension)   Demand ischemia (HCC)   Abnormal CT of the chest   Hypokalemia   #1 new onset A-fib with RVR/new onset atrial flutter CHA2DS2VASC score 4 -Patient presented with generalized weakness checked his vitals and noted to have heart rates in the 140s prompting presentation to the ED. -Patient with no significant prior cardiac history other than hypertension hyperlipidemia. -Patient with no signs or symptoms of infection.  Chest x-ray, CT chest negative for any acute infiltrate. -CT angiogram chest negative for PE. -TSH noted within normal limits at 2.054. -Magnesium at 2.0. -Cardiac enzymes elevated at 94>> 112>>> 118 likely due to demand ischemia. -Patient rate currently controlled on Cardizem drip -2D echo pending. -Patient seen in consultation by cardiology and currently being transitioned from Cardizem drip to oral Cardizem. -Continue anticoagulation with Eliquis. -Cardiology discussed TEE/DCCV as an inpatient if unable to control versus DCCV as outpatient if rate controlled. -Patient currently not interested in the idea of a TEE cardioversion and per cardiology preferring outpatient DCCV after 3 weeks of uninterrupted anticoagulation. -Cardiology following and appreciate input and recommendations.  2.  Demand ischemia/coronary artery calcifications -Patient noted to  have three-vessel coronary artery calcifications on prior CTs. -EKG with mild diffuse ST depression felt likely rate related per cardiology. -High-sensitivity troponin elevated and per cardiology consistent with demand ischemia. -Patient currently denies any chest pain. -Continue statin. -2D echo pending. -Continue anticoagulation and hold off on aspirin. -Per cardiology.  3.  Hypertension -BP currently soft on Cardizem drip. -Amlodipine and chlorthalidone discontinued per cardiology to allow room for Cardizem for rate control.  4.  Hyperlipidemia -Continue statin.  5.  Hypokalemia -Potassium supplementation ordered per cardiology this morning.  6.  Right middle lobe density/irregularity -Outpatient follow-up with noncontrast CT in 2 to 3 weeks.   DVT prophylaxis: Eliquis Code Status: Full Family Communication: Updated patient, family at bedside. Disposition: Likely home when clinically improved and cleared by cardiology.  Status is: Observation The patient remains OBS appropriate and will d/c before 2 midnights.   Consultants:  Cardiology  Procedures:  2D echo pending CT angiogram chest 01/20/2023 Chest x-ray 01/20/2023   Antimicrobials:  Anti-infectives (From admission, onward)    None         Subjective: Patient sitting up in bed.  Denies any chest pain or shortness of breath.  No abdominal pain.  Daughter at bedside.  Echo tech at bedside.  Patient noted to be in A-fib heart rate on telemetry in the 70s this morning.  Objective: Vitals:   01/20/23 2308 01/21/23 0302 01/21/23 0610 01/21/23 0836  BP: 133/74 101/60 99/61   Pulse: 73 73 74   Resp: 17 19 19    Temp: 98.4 F (36.9 C) 97.9 F (36.6 C) (!) 97.4 F (36.3 C)   TempSrc: Oral Oral Oral   SpO2: 95% 97% 97% 95%  Weight:  Height:        Intake/Output Summary (Last 24 hours) at 01/21/2023 1202 Last data filed at 01/21/2023 1045 Gross per 24 hour  Intake 1240 ml  Output 350 ml  Net 890  ml   Filed Weights   01/20/23 1338 01/20/23 2006  Weight: 58 kg 60.5 kg    Examination:  General exam: Appears calm and comfortable  Respiratory system: Clear to auscultation.  No wheezes, no crackles, no rhonchi.  Fair air movement.  Speaking in full sentences.  Respiratory effort normal. Cardiovascular system: Irregularly irregular.  No JVD, no murmurs rubs or gallops.  No lower extremity edema.   Gastrointestinal system: Abdomen is nondistended, soft and nontender. No organomegaly or masses felt. Normal bowel sounds heard. Central nervous system: Alert and oriented. No focal neurological deficits. Extremities: Symmetric 5 x 5 power. Skin: No rashes, lesions or ulcers Psychiatry: Judgement and insight appear normal. Mood & affect appropriate.     Data Reviewed: I have personally reviewed following labs and imaging studies  CBC: Recent Labs  Lab 01/20/23 1347 01/21/23 0140  WBC 6.6 8.9  NEUTROABS 5.3  --   HGB 11.5* 11.4*  HCT 35.8* 36.3*  MCV 99.4 100.0  PLT 231 236    Basic Metabolic Panel: Recent Labs  Lab 01/20/23 1347 01/21/23 0140  NA 136 138  K 3.9 3.3*  CL 102 101  CO2 27 28  GLUCOSE 115* 143*  BUN 35* 28*  CREATININE 1.32* 1.19  CALCIUM 9.4 9.5  MG 1.9 2.0    GFR: Estimated Creatinine Clearance: 31.1 mL/min (by C-G formula based on SCr of 1.19 mg/dL).  Liver Function Tests: Recent Labs  Lab 01/20/23 1347  AST 20  ALT 19  ALKPHOS 59  BILITOT 0.6  PROT 6.7  ALBUMIN 3.8    CBG: No results for input(s): "GLUCAP" in the last 168 hours.   No results found for this or any previous visit (from the past 240 hour(s)).       Radiology Studies: CT Angio Chest PE W and/or Wo Contrast  Result Date: 01/20/2023 CLINICAL DATA:  Positive D-dimer level.  Dyspnea. EXAM: CT ANGIOGRAPHY CHEST WITH CONTRAST TECHNIQUE: Multidetector CT imaging of the chest was performed using the standard protocol during bolus administration of intravenous contrast.  Multiplanar CT image reconstructions and MIPs were obtained to evaluate the vascular anatomy. RADIATION DOSE REDUCTION: This exam was performed according to the departmental dose-optimization program which includes automated exposure control, adjustment of the mA and/or kV according to patient size and/or use of iterative reconstruction technique. CONTRAST:  75mL OMNIPAQUE IOHEXOL 350 MG/ML SOLN COMPARISON:  December 17, 2022. FINDINGS: Cardiovascular: Satisfactory opacification of the pulmonary arteries to the segmental level. No evidence of pulmonary embolism. Normal heart size. No pericardial effusion. Coronary artery calcifications are noted. Mediastinum/Nodes: No enlarged mediastinal, hilar, or axillary lymph nodes. Thyroid gland, trachea, and esophagus demonstrate no significant findings. Lungs/Pleura: No pneumothorax or pleural effusion is noted. Minimal biapical scarring is noted. Stable 5 mm nodule seen in left upper lobe best seen on image number 41 of series 12. Continued presence of opacity in right lower lobe consistent with radiation change. However, there are noted increased opacity seen involving the right middle lobe anterior to the major fissure suggesting possible inflammation or possibly malignancy. This area measures 11 x 7 mm best seen on image number 99 of series 12. Upper Abdomen: No acute abnormality. Musculoskeletal: No chest wall abnormality. No acute or significant osseous findings. Review of the MIP images  confirms the above findings. IMPRESSION: No definite evidence of pulmonary embolus. Continued presence of opacity seen in right lower lobe consistent with radiation change. There is noted increased irregular density seen involving the right middle lobe anterior to the major fissure which measures 11 x 7 mm, concerning for possible inflammation or malignancy. Follow-up unenhanced chest CT in 2-3 weeks is recommended to ensure resolution or stability. Stable 5 mm nodule seen in left upper  lobe. Coronary artery calcifications are noted suggesting coronary artery disease. Aortic Atherosclerosis (ICD10-I70.0). Electronically Signed   By: Lupita Raider M.D.   On: 01/20/2023 16:45   DG Chest Portable 1 View  Result Date: 01/20/2023 CLINICAL DATA:  Dyspnea.  Tachycardia. EXAM: PORTABLE CHEST 1 VIEW COMPARISON:  CT scan chest from 12/15/2022. FINDINGS: There is an approximately 11 x 17 mm opacity overlying the right lower lung zone with several linear areas of atelectasis/scarring along its lateral aspect reaching up to the pleural surface. This corresponds to the nodule seen on the prior exam from 12/15/2022. Bilateral lung fields are otherwise clear. No acute consolidation or lung collapse. No pulmonary edema. Bilateral costophrenic angles are clear. No pneumothorax. Normal cardio-mediastinal silhouette. No acute osseous abnormalities. The soft tissues are within normal limits. IMPRESSION: *Nodule overlying the right lower lung zone, corresponds to the opacity seen on the prior CT scan from 12/15/2022. *Otherwise no acute cardiopulmonary abnormality. Electronically Signed   By: Jules Schick M.D.   On: 01/20/2023 16:40        Scheduled Meds:  apixaban  2.5 mg Oral BID   cholecalciferol  2,000 Units Oral Daily   cyanocobalamin  2,000 mcg Oral Daily   diltiazem  30 mg Oral Q6H   [START ON 01/22/2023] diltiazem  30 mg Oral Q6H   finasteride  5 mg Oral Daily   fluticasone  1 spray Each Nare q AM   ipratropium  2 spray Each Nare Q12H   mometasone-formoterol  2 puff Inhalation BID   potassium chloride  40 mEq Oral BID   rosuvastatin  20 mg Oral Daily   Continuous Infusions:   LOS: 0 days    Time spent: 40 minutes    Ramiro Harvest, MD [facility]   To contact the attending provider between 7A-7P or the covering provider during after hours 7P-7A, please log into the web site [email or web address] and access using universal [place] password for that web site. If you do  not have the password, please call the hospital operator.  01/21/2023, 12:02 PM

## 2023-01-21 NOTE — TOC Initial Note (Signed)
Transition of Care (TOC) - Initial/Assessment Note    Patient Details  Name: [name]. MRN: [id number] Date of Birth: 1924-03-24  Transition of Care [facility]) CM/SW Contact:    Lanier Clam, RN Phone Number: 01/21/2023, 1:52 PM  Clinical Narrative:  d/c plan home.                 Expected Discharge Plan: Home/Self Care Barriers to Discharge: Continued Medical Work up   Patient Goals and CMS Choice Patient states their goals for this hospitalization and ongoing recovery are:: Home CMS Medicare.gov Compare Post Acute Care list provided to:: Patient Represenative (must comment) (Nancy(dtr)) Choice offered to / list presented to : Adult Children [place] ownership interest in [facility].provided to:: Adult Children    Expected Discharge Plan and Services   Discharge Planning Services: CM Consult Post Acute Care Choice: Resumption of Svcs/PTA Provider Living arrangements for the past 2 months: Single Family Home                                      Prior Living Arrangements/Services Living arrangements for the past 2 months: Single Family Home Lives with:: Self                   Activities of Daily Living   ADL Screening (condition at time of admission) Independently performs ADLs?: Yes (appropriate for developmental age) Is the patient deaf or have difficulty hearing?: Yes Does the patient have difficulty seeing, even when wearing glasses/contacts?: No Does the patient have difficulty concentrating, remembering, or making decisions?: No  Permission Sought/Granted                  Emotional Assessment              Admission diagnosis:  Atrial fibrillation with RVR (HCC) [I48.91] Atrial fibrillation, unspecified type [facility]) [I48.91] Patient Active Problem List   Diagnosis Date Noted   Atypical atrial flutter (HCC) 01/21/2023   Hyperlipidemia 01/21/2023   HTN (hypertension) 01/21/2023   Demand ischemia (HCC) 01/21/2023    Abnormal CT of the chest 01/21/2023   Hypokalemia 01/21/2023   Atrial fibrillation with RVR (HCC) 01/20/2023   History of vitrectomy 08/24/2021   Left epiretinal membrane 08/24/2021   Primary open angle glaucoma of left eye, severe stage 05/19/2020   Malignant neoplasm of bronchus of right lower lobe (HCC) 03/06/2020   Right epiretinal membrane 06/21/2019   Intermediate stage nonexudative age-related macular degeneration of left eye 06/21/2019   Intermediate stage nonexudative age-related macular degeneration of right eye 06/21/2019   Quadriceps tendon rupture, right, subsequent encounter 08/23/2018   Quadriceps tendon rupture, right, initial encounter 08/23/2018   OA (osteoarthritis) of hip 05/05/2016   MGUS (monoclonal gammopathy of unknown significance) 08/10/2011   ANEMIA-UNSPECIFIED 01/15/2010   Constipation 01/15/2010   History of colonic polyps 01/15/2010   PCP:  [name], MD Pharmacy:   [facility] [id number] - [place], [place] - [address] [place] [address] Phone: [phone] Fax: [phone]     Social Determinants of Health (SDOH) Social History: SDOH Screenings   Food Insecurity: No Food Insecurity (01/20/2023)  Housing: Low Risk  (01/20/2023)  Transportation Needs: No Transportation Needs (01/20/2023)  Utilities: Not At Risk (01/20/2023)  Tobacco Use: Low Risk  (01/20/2023)   SDOH Interventions:     Readmission Risk Interventions     No data to  display

## 2023-01-21 NOTE — Care Management Obs Status (Signed)
MEDICARE OBSERVATION STATUS NOTIFICATION   Patient Details  Name: [name]. MRN: [id number] Date of Birth: [birth year]   Medicare Observation Status Notification Given:  Yes    Lanier Clam, RN 01/21/2023, 1:45 PM

## 2023-01-21 NOTE — Progress Notes (Signed)
Mobility Specialist - Progress Note  Pre-mobility: 74 bpm HR,  During mobility: 146 bpm HR, Post-mobility: 105 bpm HR,   01/21/23 0912  Mobility  Activity Ambulated independently in hallway  Level of Assistance Contact guard assist, steadying assist  Assistive Device None  Distance Ambulated (ft) 100 ft  Range of Motion/Exercises Active  Activity Response Tolerated fair  Mobility Referral Yes  $Mobility charge 1 Mobility  Mobility Specialist Start Time (ACUTE ONLY) 0900  Mobility Specialist Stop Time (ACUTE ONLY) 0912  Mobility Specialist Time Calculation (min) (ACUTE ONLY) 12 min   Pt was found in bed and agreeable to ambulate. Was a little unsteady with ambulation. Limited distance due to elevated HR up to 146 bpm. Returned to bed with all needs met and able to decrease HR to 105 bpm within 1 min. Call bell in reach and daughter in room. Bed alarm on.  Billey Chang Mobility Specialist

## 2023-01-21 NOTE — Plan of Care (Signed)
  Problem: Education: Goal: Knowledge of General Education information will improve Description: Including pain rating scale, medication(s)/side effects and non-pharmacologic comfort measures Outcome: Progressing   Problem: Clinical Measurements: Goal: Diagnostic test results will improve Outcome: Progressing Goal: Cardiovascular complication will be avoided Outcome: Progressing   Problem: Coping: Goal: Level of anxiety will decrease Outcome: Progressing

## 2023-01-21 NOTE — Evaluation (Signed)
Occupational Therapy Evaluation Patient Details Name: [name]. MRN: [id number] DOB: 15-Mar-1924 Today's Date: 01/21/2023   History of Present Illness [name]. is a 89 y.o. male presents with weakness; admitted with atypical a flutter. PMH: HTN, hyperlipidemia, melanoma   Clinical Impression   Prior to hospital admission, pt was modified independent with ADLs and IADLs including driving. Pt lives alone, very active, played tennis until 89 y.o. Pt currently requires supervision for ADLs and functional mobility. HR 79-107 with ~200 ft in hallway with supervision, no AD.  Educated pt on fall prevention strategies for safety in home. Pt has good support from friends and family. Pt would benefit from skilled OT services to address noted impairments and functional limitations (see below for any additional details) in order to maximize safety and independence while minimizing falls risk and caregiver burden. Do not anticipate the need for follow up OT services upon acute hospital DC.        If plan is discharge home, recommend the following: Assistance with cooking/housework;Assist for transportation;Help with stairs or ramp for entrance    Functional Status Assessment  Patient has had a recent decline in their functional status and demonstrates the ability to make significant improvements in function in a reasonable and predictable amount of time.  Equipment Recommendations  None recommended by OT       Precautions / Restrictions Precautions Precautions: Fall Restrictions Weight Bearing Restrictions: No      Mobility Bed Mobility               General bed mobility comments: NT, in recliner    Transfers Overall transfer level: Needs assistance Equipment used: None Transfers: Sit to/from Stand Sit to Stand: Supervision           General transfer comment: STS from recliner, supervision      Balance Overall balance assessment: No apparent balance deficits  (not formally assessed)                                         ADL either performed or assessed with clinical judgement   ADL Overall ADL's : Needs assistance/impaired Eating/Feeding: Independent   Grooming: Modified independent;Sitting;Standing   Upper Body Bathing: Standing;Sitting;Supervision/ safety   Lower Body Bathing: Supervison/ safety;Sitting/lateral leans;Sit to/from stand   Upper Body Dressing : Supervision/safety;Sitting;Standing   Lower Body Dressing: Supervision/safety;Sitting/lateral leans;Sit to/from stand   Toilet Transfer: Supervision/safety;Ambulation   Toileting- Clothing Manipulation and Hygiene: Supervision/safety;Sitting/lateral lean;Sit to/from stand       Functional mobility during ADLs: Supervision/safety General ADL Comments: Pt functioning close to baseline for ADL performance. Functional mobility ~200 ft with  supervision, no AD, HR 79-107. Supervision for UB/LB ADL tasks     Vision Baseline Vision/History: 1 Wears glasses Patient Visual Report: No change from baseline              Pertinent Vitals/Pain Pain Assessment Pain Assessment: No/denies pain     Extremity/Trunk Assessment Upper Extremity Assessment Upper Extremity Assessment: Overall WFL for tasks assessed   Lower Extremity Assessment Lower Extremity Assessment: Overall WFL for tasks assessed   Cervical / Trunk Assessment Cervical / Trunk Assessment: Normal   Communication Communication Communication: Hearing impairment   Cognition Arousal: Alert Behavior During Therapy: WFL for tasks assessed/performed Overall Cognitive Status: Within Functional Limits for tasks assessed  Home Living Family/patient expects to be discharged to:: Private residence Living Arrangements: Alone Available Help at Discharge: Family;Available 24 hours/day Type of Home: House Home Access: Stairs to  enter;Ramped entrance Entrance Stairs-Number of Steps: 4   Home Layout: One level     Bathroom Shower/Tub: Tub/shower unit;Walk-in shower   Bathroom Toilet: Standard Bathroom Accessibility: Yes   Home Equipment: Agricultural consultant (2 wheels);Cane - single point;Wheelchair - manual;Hand held shower head;Shower seat          Prior Functioning/Environment Prior Level of Function : Independent/Modified Independent;Driving             Mobility Comments: pt ind with household and community amb, no AD, cuts grass ADLs Comments: pt ind with ADLs/IADLs, drives        OT Problem List: Cardiopulmonary status limiting activity      OT Treatment/Interventions: Self-care/ADL training;Therapeutic exercise;Energy conservation;DME and/or AE instruction;Therapeutic activities;Patient/family education;Balance training    OT Goals(Current goals can be found in the care plan section) Acute Rehab OT Goals Patient Stated Goal: to go home OT Goal Formulation: With patient Time For Goal Achievement: 02/04/23 Potential to Achieve Goals: Good  OT Frequency: Min 1X/week       AM-PAC OT "6 Clicks" Daily Activity     Outcome Measure Help from another person eating meals?: None Help from another person taking care of personal grooming?: None Help from another person toileting, which includes using toliet, bedpan, or urinal?: A Little Help from another person bathing (including washing, rinsing, drying)?: None Help from another person to put on and taking off regular upper body clothing?: A Little Help from another person to put on and taking off regular lower body clothing?: A Little 6 Click Score: 21   End of Session Equipment Utilized During Treatment: Gait belt Nurse Communication: Mobility status;Other (comment) (HR while ambulating)  Activity Tolerance: Patient tolerated treatment well Patient left: in chair;with call bell/phone within reach;with chair alarm set  OT Visit Diagnosis:  Other abnormalities of gait and mobility (R26.89)                Time: 0109-3235 OT Time Calculation (min): 24 min Charges:  OT General Charges $OT Visit: 1 Visit OT Evaluation $OT Eval Low Complexity: 1 Low OT Treatments $Self Care/Home Management : 8-22 mins [name], OTR/L  01/21/23, 4:13 PM

## 2023-01-21 NOTE — Evaluation (Signed)
Physical Therapy Evaluation Patient Details Name: [name]. MRN: [id number] DOB: [birth year] Today's Date: 01/21/2023  History of Present Illness  Donald Mcgee. is a 89 y.o. male presents with weakness; admitted with atypical a flutter. PMH: HTN, hyperlipidemia, melanoma  Clinical Impression  Pt admitted with above diagnosis. Pt ind at baseline, has good family support, no DME use at baseline, drives, completes household chores and self care without assistance, denies falls. On eval, pt modified ind with bed mobility, supv with transfers and ambulation without AD, no overt LOB. Pt's HR 112 max noted, mostly 80s-90s with mobility. Pt denies pain, dizziness, fatigue. Pt tolerates remaining up in recliner with family present. Anticipate no f/u PT at discharge. Pt currently with functional limitations due to the deficits listed below (see PT Problem List). Pt will benefit from acute skilled PT to increase their independence and safety with mobility to allow discharge.           If plan is discharge home, recommend the following: Assistance with cooking/housework;Assist for transportation;Help with stairs or ramp for entrance   Can travel by private vehicle        Equipment Recommendations None recommended by PT  Recommendations for Other Services       Functional Status Assessment Patient has had a recent decline in their functional status and demonstrates the ability to make significant improvements in function in a reasonable and predictable amount of time.     Precautions / Restrictions Precautions Precautions: Fall Restrictions Weight Bearing Restrictions: No      Mobility  Bed Mobility Overal bed mobility: Modified Independent             General bed mobility comments: silghtly increased time, no physical assistance    Transfers Overall transfer level: Needs assistance Equipment used: None Transfers: Sit to/from Stand Sit to Stand: Supervision            General transfer comment: BUE assisting to power up to standing, no physical assist    Ambulation/Gait Ambulation/Gait assistance: Supervision Gait Distance (Feet): 300 Feet Assistive device: None Gait Pattern/deviations: Step-through pattern, Decreased stride length Gait velocity: decreased     General Gait Details: decreased cadence, step through gait pattern, decreased trunk rotation and decreased BUE arm swing, no overt LOB, max HR 112 noted but mainly 80s-90s with ambulation  Stairs            Wheelchair Mobility     Tilt Bed    Modified Rankin (Stroke Patients Only)       Balance Overall balance assessment: No apparent balance deficits (not formally assessed)                                           Pertinent Vitals/Pain Pain Assessment Pain Assessment: No/denies pain    Home Living Family/patient expects to be discharged to:: Private residence Living Arrangements: Alone Available Help at Discharge: Family;Available 24 hours/day Type of Home: House Home Access: Stairs to enter;Ramped entrance   Entrance Stairs-Number of Steps: 4   Home Layout: One level Home Equipment: Agricultural consultant (2 wheels);Cane - single point;Wheelchair - manual;Hand held shower head;Shower seat      Prior Function Prior Level of Function : Independent/Modified Independent;Driving             Mobility Comments: pt ind with household and community amb, no AD, cuts grass ADLs Comments: pt  ind with ADLs/IADLs, drives     Extremity/Trunk Assessment   Upper Extremity Assessment Upper Extremity Assessment: Defer to OT evaluation    Lower Extremity Assessment Lower Extremity Assessment: Overall WFL for tasks assessed    Cervical / Trunk Assessment Cervical / Trunk Assessment: Normal  Communication   Communication Communication: Hearing impairment (HOH)  Cognition Arousal: Alert Behavior During Therapy: WFL for tasks assessed/performed Overall  Cognitive Status: Within Functional Limits for tasks assessed                                          General Comments      Exercises     Assessment/Plan    PT Assessment Patient needs continued PT services  PT Problem List Decreased activity tolerance;Decreased balance;Cardiopulmonary status limiting activity;Decreased knowledge of precautions       PT Treatment Interventions DME instruction;Gait training;Stair training;Functional mobility training;Therapeutic activities;Balance training;Therapeutic exercise;Neuromuscular re-education;Patient/family education    PT Goals (Current goals can be found in the Care Plan section)  Acute Rehab PT Goals Patient Stated Goal: return home PT Goal Formulation: With patient/family Time For Goal Achievement: 02/04/23 Potential to Achieve Goals: Good    Frequency Min 1X/week     Co-evaluation               AM-PAC PT "6 Clicks" Mobility  Outcome Measure Help needed turning from your back to your side while in a flat bed without using bedrails?: None Help needed moving from lying on your back to sitting on the side of a flat bed without using bedrails?: None Help needed moving to and from a bed to a chair (including a wheelchair)?: A Little Help needed standing up from a chair using your arms (e.g., wheelchair or bedside chair)?: A Little Help needed to walk in hospital room?: A Little Help needed climbing 3-5 steps with a railing? : A Little 6 Click Score: 20    End of Session Equipment Utilized During Treatment: Gait belt Activity Tolerance: Patient tolerated treatment well Patient left: in chair;with call bell/phone within reach;with family/visitor present Nurse Communication: Mobility status;Other (comment) (HR) PT Visit Diagnosis: Other abnormalities of gait and mobility (R26.89)    Time: 1610-9604 PT Time Calculation (min) (ACUTE ONLY): 17 min   Charges:   PT Evaluation $PT Eval Low Complexity: 1  Low   PT General Charges $$ ACUTE PT VISIT: 1 Visit         Tori Davaughn Hillyard PT, DPT 01/21/23, 2:06 PM

## 2023-01-22 ENCOUNTER — Other Ambulatory Visit (HOSPITAL_COMMUNITY): Payer: Self-pay

## 2023-01-22 DIAGNOSIS — I2489 Other forms of acute ischemic heart disease: Secondary | ICD-10-CM | POA: Diagnosis not present

## 2023-01-22 DIAGNOSIS — E876 Hypokalemia: Secondary | ICD-10-CM | POA: Diagnosis not present

## 2023-01-22 DIAGNOSIS — I4891 Unspecified atrial fibrillation: Secondary | ICD-10-CM | POA: Diagnosis not present

## 2023-01-22 DIAGNOSIS — I484 Atypical atrial flutter: Secondary | ICD-10-CM | POA: Diagnosis not present

## 2023-01-22 LAB — CBC
HCT: 30.8 % — ABNORMAL LOW (ref 39.0–52.0)
Hemoglobin: 10 g/dL — ABNORMAL LOW (ref 13.0–17.0)
MCH: 32.5 pg (ref 26.0–34.0)
MCHC: 32.5 g/dL (ref 30.0–36.0)
MCV: 100 fL (ref 80.0–100.0)
Platelets: 199 10*3/uL (ref 150–400)
RBC: 3.08 MIL/uL — ABNORMAL LOW (ref 4.22–5.81)
RDW: 13.4 % (ref 11.5–15.5)
WBC: 5.7 10*3/uL (ref 4.0–10.5)
nRBC: 0 % (ref 0.0–0.2)

## 2023-01-22 LAB — BASIC METABOLIC PANEL
Anion gap: 7 (ref 5–15)
BUN: 34 mg/dL — ABNORMAL HIGH (ref 8–23)
CO2: 25 mmol/L (ref 22–32)
Calcium: 9 mg/dL (ref 8.9–10.3)
Chloride: 105 mmol/L (ref 98–111)
Creatinine, Ser: 1.47 mg/dL — ABNORMAL HIGH (ref 0.61–1.24)
GFR, Estimated: 43 mL/min — ABNORMAL LOW (ref >60)
Glucose, Bld: 108 mg/dL — ABNORMAL HIGH (ref 70–99)
Potassium: 3.9 mmol/L (ref 3.5–5.1)
Sodium: 137 mmol/L (ref 135–145)

## 2023-01-22 LAB — MAGNESIUM: Magnesium: 2 mg/dL (ref 1.7–2.4)

## 2023-01-22 MED ORDER — DILTIAZEM HCL 30 MG PO TABS
30.0000 mg | ORAL_TABLET | Freq: Four times a day (QID) | ORAL | Status: DC
  Administered 2023-01-22: 30 mg via ORAL
  Filled 2023-01-22: qty 1

## 2023-01-22 MED ORDER — DILTIAZEM HCL ER COATED BEADS 180 MG PO CP24
180.0000 mg | ORAL_CAPSULE | Freq: Every day | ORAL | Status: DC
  Filled 2023-01-22: qty 1

## 2023-01-22 MED ORDER — DILTIAZEM HCL ER COATED BEADS 180 MG PO CP24: 180.0000 mg | ORAL_CAPSULE | Freq: Every day | ORAL | Status: DC

## 2023-01-22 MED ORDER — ACETAMINOPHEN 325 MG PO TABS: 650.0000 mg | ORAL_TABLET | Freq: Four times a day (QID) | ORAL | Status: AC | PRN

## 2023-01-22 MED ORDER — DILTIAZEM HCL 30 MG PO TABS
30.0000 mg | ORAL_TABLET | Freq: Four times a day (QID) | ORAL | 1 refills | Status: DC
  Filled 2023-01-22: qty 10, 3d supply, fill #0
  Filled 2023-01-22: qty 110, 27d supply, fill #0

## 2023-01-22 MED ORDER — APIXABAN 2.5 MG PO TABS
2.5000 mg | ORAL_TABLET | Freq: Two times a day (BID) | ORAL | 1 refills | Status: DC
  Filled 2023-01-22: qty 60, 30d supply, fill #0

## 2023-01-22 NOTE — Progress Notes (Addendum)
Progress Note  Patient Name: [name]. Date of Encounter: 01/22/2023  Primary Cardiologist: None   Subjective   Patient seen examined her bedside.  Family members in the room.  They had a lot of questions about the DCCV.  I was able to answer all of this to their satisfaction.  Inpatient Medications    Scheduled Meds:  apixaban  2.5 mg Oral BID   cholecalciferol  2,000 Units Oral Daily   cyanocobalamin  2,000 mcg Oral Daily   diltiazem  30 mg Oral Q6H   finasteride  5 mg Oral Daily   fluticasone  1 spray Each Nare q AM   ipratropium  2 spray Each Nare Q12H   mometasone-formoterol  2 puff Inhalation BID   rosuvastatin  20 mg Oral Daily   Continuous Infusions:  PRN Meds: acetaminophen **OR** acetaminophen, albuterol, calcium carbonate, ondansetron **OR** ondansetron (ZOFRAN) IV, polyethylene glycol, traZODone   Vital Signs    Vitals:   01/21/23 1409 01/21/23 1947 01/22/23 0336 01/22/23 0623  BP: 114/64 112/72 121/65 119/63  Pulse: 81 68 77   Resp: 18 17 18    Temp: 98.1 F (36.7 C) 97.8 F (36.6 C) 98.5 F (36.9 C)   TempSrc: Oral Oral Oral   SpO2: 99% 96% 98%   Weight:      Height:        Intake/Output Summary (Last 24 hours) at 01/22/2023 0843 Last data filed at 01/21/2023 2200 Gross per 24 hour  Intake 927.44 ml  Output --  Net 927.44 ml   Filed Weights   01/20/23 1338 01/20/23 2006  Weight: 58 kg 60.5 kg    Telemetry    Atrial fibrillation- Personally Reviewed  ECG    None today- Personally Reviewed  Physical Exam    General: Comfortable, sitting up in a chair Head: Atraumatic, normal size  Eyes: PEERLA, EOMI  Neck: Supple, normal JVD Cardiac: Normal S1, S2; RRR; no murmurs, rubs, or gallops Lungs: Clear to auscultation bilaterally Abd: Soft, nontender, no hepatomegaly  Ext: warm, no edema Musculoskeletal: No deformities, BUE and BLE strength normal and equal Skin: Warm and dry, no rashes   Neuro: Alert and oriented to person,  place, time, and situation, CNII-XII grossly intact, no focal deficits  Psych: Normal mood and affect   Labs    Chemistry Recent Labs  Lab 01/20/23 1347 01/21/23 0140 01/22/23 0539  NA 136 138 137  K 3.9 3.3* 3.9  CL 102 101 105  CO2 27 28 25   GLUCOSE 115* 143* 108*  BUN 35* 28* 34*  CREATININE 1.32* 1.19 1.47*  CALCIUM 9.4 9.5 9.0  PROT 6.7  --   --   ALBUMIN 3.8  --   --   AST 20  --   --   ALT 19  --   --   ALKPHOS 59  --   --   BILITOT 0.6  --   --   GFRNONAA 49* 56* 43*  ANIONGAP 7 9 7      Hematology Recent Labs  Lab 01/20/23 1347 01/21/23 0140 01/22/23 0539  WBC 6.6 8.9 5.7  RBC 3.60* 3.63* 3.08*  HGB 11.5* 11.4* 10.0*  HCT 35.8* 36.3* 30.8*  MCV 99.4 100.0 100.0  MCH 31.9 31.4 32.5  MCHC 32.1 31.4 32.5  RDW 13.2 13.2 13.4  PLT 231 236 199    Cardiac EnzymesNo results for input(s): "TROPONINI" in the last 168 hours. No results for input(s): "TROPIPOC" in the last 168 hours.  BNP Recent Labs  Lab 01/20/23 1347  BNP 186.9*     DDimer  Recent Labs  Lab 01/20/23 1347  DDIMER 0.81*     Radiology    ECHOCARDIOGRAM COMPLETE  Result Date: 01/21/2023    ECHOCARDIOGRAM REPORT   Patient Name:   [name]. Date of Exam: 01/21/2023 Medical Rec #:  [id number]         Height:       67.0 in Accession #:    [id number]        Weight:       133.4 lb Date of Birth:  30-Jul-1924          BSA:          1.702 m Patient Age:    89 years          BP:           99/61 mmHg Patient Gender: M                 HR:           61 bpm. Exam Location:  Inpatient Procedure: 2D Echo, Cardiac Doppler and Color Doppler Indications:    Atrial fibrillation  History:        Patient has no prior history of Echocardiogram examinations.                 Arrythmias:Atrial Fibrillation; Risk Factors:Hypertension and                 Dyslipidemia.  Sonographer:    Milda Smart Referring Phys: [id number] MIR M [facility]  Sonographer Comments: Image acquisition challenging due to patient  body habitus. IMPRESSIONS  1. Left ventricular ejection fraction, by estimation, is 60 to 65%. The left ventricle has normal function. The left ventricle has no regional wall motion abnormalities. There is mild left ventricular hypertrophy. Left ventricular diastolic function could not be evaluated.  2. Right ventricular systolic function is normal. The right ventricular size is normal. There is mildly elevated pulmonary artery systolic pressure.  3. Left atrial size was mild to moderately dilated.  4. The mitral valve is grossly normal. Mild mitral valve regurgitation. No evidence of mitral stenosis.  5. Tricuspid valve regurgitation is moderate.  6. The aortic valve is tricuspid. There is mild calcification of the aortic valve. There is mild thickening of the aortic valve. Aortic valve regurgitation is not visualized. Mild aortic valve stenosis.  7. Aortic dilatation noted. There is borderline dilatation of the ascending aorta, measuring 38 mm.  8. The inferior vena cava is dilated in size with >50% respiratory variability, suggesting right atrial pressure of 8 mmHg. Comparison(s): No prior Echocardiogram. Conclusion(s)/Recommendation(s): Otherwise normal echocardiogram, with minor abnormalities described in the report. FINDINGS  Left Ventricle: Left ventricular ejection fraction, by estimation, is 60 to 65%. The left ventricle has normal function. The left ventricle has no regional wall motion abnormalities. The left ventricular internal cavity size was normal in size. There is  mild left ventricular hypertrophy. Left ventricular diastolic function could not be evaluated due to atrial fibrillation. Left ventricular diastolic function could not be evaluated. Right Ventricle: The right ventricular size is normal. Right vetricular wall thickness was not well visualized. Right ventricular systolic function is normal. There is mildly elevated pulmonary artery systolic pressure. The tricuspid regurgitant velocity  is  2.78 m/s, and with an assumed right atrial pressure of 8 mmHg, the estimated right ventricular systolic pressure is 38.9 mmHg. Left Atrium: Left atrial size was  mild to moderately dilated. Right Atrium: Right atrial size was normal in size. Pericardium: There is no evidence of pericardial effusion. Mitral Valve: The mitral valve is grossly normal. Mild mitral valve regurgitation. No evidence of mitral valve stenosis. Tricuspid Valve: The tricuspid valve is normal in structure. Tricuspid valve regurgitation is moderate . No evidence of tricuspid stenosis. Aortic Valve: The aortic valve is tricuspid. There is mild calcification of the aortic valve. There is mild thickening of the aortic valve. Aortic valve regurgitation is not visualized. Mild aortic stenosis is present. Aortic valve mean gradient measures  11.0 mmHg. Aortic valve peak gradient measures 21.2 mmHg. Aortic valve area, by VTI measures 1.37 cm. Pulmonic Valve: The pulmonic valve was grossly normal. Pulmonic valve regurgitation is not visualized. No evidence of pulmonic stenosis. Aorta: Aortic dilatation noted. There is borderline dilatation of the ascending aorta, measuring 38 mm. Venous: The inferior vena cava is dilated in size with greater than 50% respiratory variability, suggesting right atrial pressure of 8 mmHg. IAS/Shunts: The atrial septum is grossly normal.  LEFT VENTRICLE PLAX 2D LVOT diam:     1.80 cm   Diastology LV SV:         56        LV e' medial:  7.29 cm/s LV SV Index:   33        LV e' lateral: 5.22 cm/s LVOT Area:     2.54 cm  RIGHT VENTRICLE             IVC RV Basal diam:  2.93 cm     IVC diam: 2.30 cm RV S prime:     11.10 cm/s TAPSE (M-mode): 1.7 cm LEFT ATRIUM             Index        RIGHT ATRIUM           Index LA Vol (A2C):   51.4 ml 30.19 ml/m  RA Area:     15.10 cm LA Vol (A4C):   44.6 ml 26.20 ml/m  RA Volume:   34.00 ml  19.97 ml/m LA Biplane Vol: 49.2 ml 28.90 ml/m  AORTIC VALVE AV Area (Vmax):    1.28 cm AV Area  (Vmean):   1.42 cm AV Area (VTI):     1.37 cm AV Vmax:           230.00 cm/s AV Vmean:          151.000 cm/s AV VTI:            0.411 m AV Peak Grad:      21.2 mmHg AV Mean Grad:      11.0 mmHg LVOT Vmax:         116.00 cm/s LVOT Vmean:        84.400 cm/s LVOT VTI:          0.221 m LVOT/AV VTI ratio: 0.54  AORTA Ao Root diam: 2.90 cm Ao Asc diam:  3.80 cm TRICUSPID VALVE TR Peak grad:   30.9 mmHg TR Mean grad:   22.0 mmHg TR Vmax:        278.00 cm/s TR Vmean:       227.0 cm/s  SHUNTS Systemic VTI:  0.22 m Systemic Diam: 1.80 cm Jodelle Red MD Electronically signed by Jodelle Red MD Signature Date/Time: 01/21/2023/5:00:53 PM    Final    CT Angio Chest PE W and/or Wo Contrast  Result Date: 01/20/2023 CLINICAL DATA:  Positive D-dimer level.  Dyspnea. EXAM: CT  ANGIOGRAPHY CHEST WITH CONTRAST TECHNIQUE: Multidetector CT imaging of the chest was performed using the standard protocol during bolus administration of intravenous contrast. Multiplanar CT image reconstructions and MIPs were obtained to evaluate the vascular anatomy. RADIATION DOSE REDUCTION: This exam was performed according to the departmental dose-optimization program which includes automated exposure control, adjustment of the mA and/or kV according to patient size and/or use of iterative reconstruction technique. CONTRAST:  75mL OMNIPAQUE IOHEXOL 350 MG/ML SOLN COMPARISON:  December 17, 2022. FINDINGS: Cardiovascular: Satisfactory opacification of the pulmonary arteries to the segmental level. No evidence of pulmonary embolism. Normal heart size. No pericardial effusion. Coronary artery calcifications are noted. Mediastinum/Nodes: No enlarged mediastinal, hilar, or axillary lymph nodes. Thyroid gland, trachea, and esophagus demonstrate no significant findings. Lungs/Pleura: No pneumothorax or pleural effusion is noted. Minimal biapical scarring is noted. Stable 5 mm nodule seen in left upper lobe best seen on image number 41 of  series 12. Continued presence of opacity in right lower lobe consistent with radiation change. However, there are noted increased opacity seen involving the right middle lobe anterior to the major fissure suggesting possible inflammation or possibly malignancy. This area measures 11 x 7 mm best seen on image number 99 of series 12. Upper Abdomen: No acute abnormality. Musculoskeletal: No chest wall abnormality. No acute or significant osseous findings. Review of the MIP images confirms the above findings. IMPRESSION: No definite evidence of pulmonary embolus. Continued presence of opacity seen in right lower lobe consistent with radiation change. There is noted increased irregular density seen involving the right middle lobe anterior to the major fissure which measures 11 x 7 mm, concerning for possible inflammation or malignancy. Follow-up unenhanced chest CT in 2-3 weeks is recommended to ensure resolution or stability. Stable 5 mm nodule seen in left upper lobe. Coronary artery calcifications are noted suggesting coronary artery disease. Aortic Atherosclerosis (ICD10-I70.0). Electronically Signed   By: Lupita Raider M.D.   On: 01/20/2023 16:45   DG Chest Portable 1 View  Result Date: 01/20/2023 CLINICAL DATA:  Dyspnea.  Tachycardia. EXAM: PORTABLE CHEST 1 VIEW COMPARISON:  CT scan chest from 12/15/2022. FINDINGS: There is an approximately 11 x 17 mm opacity overlying the right lower lung zone with several linear areas of atelectasis/scarring along its lateral aspect reaching up to the pleural surface. This corresponds to the nodule seen on the prior exam from 12/15/2022. Bilateral lung fields are otherwise clear. No acute consolidation or lung collapse. No pulmonary edema. Bilateral costophrenic angles are clear. No pneumothorax. Normal cardio-mediastinal silhouette. No acute osseous abnormalities. The soft tissues are within normal limits. IMPRESSION: *Nodule overlying the right lower lung zone,  corresponds to the opacity seen on the prior CT scan from 12/15/2022. *Otherwise no acute cardiopulmonary abnormality. Electronically Signed   By: Jules Schick M.D.   On: 01/20/2023 16:40    Cardiac Studies   Echo reviewed  Patient Profile     89 y.o. male with new onset atrial fibrillation  Assessment & Plan    New onset atrial flutter-has been started on Cardizem and Eliquis.  Plan for patient cardioversion once he has completed at least 3 weeks of uninterrupted anticoagulation.  He prefers not to have inpatient TEE at this time and I do not think this is unreasonable.     Coronary artery calcification with DM ischemia-continue statin  Hypertension-on Cardizem, please continue to hold chlorthalidone.  Can be discharged home on Cardizem and an outpatient blood pressure can be reassessed if need for additional antihypertensive.  Hyperlipidemia-continue Crestor 20 mg daily  Electrolyte abnormalities-keep K above 4 and mag above 2.    [facility] will sign off.   Medication Recommendations: Eliquis 2.5 mg twice daily, diltiazem 180 mg daily Other recommendations (labs, testing, etc): Need DC cardioversion in the outpatient setting Follow up as an outpatient: For atrial fibrillation, routine follow-up  For questions or updates, please contact [facility] Please consult [email or web address] for contact info under Cardiology/STEMI.      SignedThomasene Ripple, DO  01/22/2023, 8:43 AM

## 2023-01-22 NOTE — Discharge Summary (Signed)
Physician Discharge Summary  [name]. AVW:[id number] DOB: 06-Jul-1924 DOA: 01/20/2023  PCP: Donald Ran, MD  Admit date: 01/20/2023 Discharge date: 01/22/2023  Time spent: 60 minutes  Recommendations for Outpatient Follow-up:  Follow-up with Donald Ran, MD in 2 weeks.  On follow-up patient will need a basic metabolic profile done to follow-up on electrolytes and renal function.  Patient's blood pressure need to be reassessed as patient's Norvasc and chlorthalidone were discontinued during the hospitalization.  Will need follow-up with noncontrast outpatient CT in 2 to 3 weeks to follow-up on right middle lobe density/irregularity. Follow-up.  Dr. Chilton Si, cardiology in 2 to 3 weeks./Follow-up in A-fib clinic patient will need to be set up for DCCV.   Discharge Diagnoses:  Principal Problem:   Atypical atrial flutter (HCC) Active Problems:   Atrial fibrillation with RVR (HCC)   Hyperlipidemia   HTN (hypertension)   Demand ischemia (HCC)   Abnormal CT of the chest   Hypokalemia   Discharge Condition: Stable and improved.  Diet recommendation: Regular  Filed Weights   01/20/23 1338 01/20/23 2006  Weight: 58 kg 60.5 kg    History of present illness:  HPI per Dr. Hinda Ramsey. is a 89 y.o. male with medical history significant for hypertension, hyperlipidemia being admitted to the hospital with new onset atrial fibrillation with RVR.  Patient states he has been in his usual state of health, when he woke up this morning he felt particularly weak, and doing something was wrong.  He checked his vital signs as he typically does every morning, discovered that his heart rate was 144.  Denies any dizziness, chest pain, or palpitations.  Has never had this issue in the past.  No recent illness, or changes in his medications.  His daughter and son-in-law are at the bedside, states that he is incredibly active and healthy for his age.   Hospital Course:  #1  new onset A-fib with RVR/new onset atrial flutter CHA2DS2VASC score 4 -Patient presented with generalized weakness checked his vitals and noted to have heart rates in the 140s prompting presentation to the ED. -Patient with no significant prior cardiac history other than hypertension hyperlipidemia. -Patient with no signs or symptoms of infection.  Chest x-ray, CT chest negative for any acute infiltrate. -CT angiogram chest negative for PE. -TSH noted within normal limits at 2.054. -Magnesium at 2.0. -Cardiac enzymes elevated at 94>> 112>>> 118 likely due to demand ischemia. -Patient rate currently controlled on Cardizem drip -2D echo with a EF of 60 to 65%, NWMA, mild LVH.  Mildly to moderately dilated left atrial size.  Mild MVR no evidence of mitral stenosis.  Moderate TVR.  Mild aortic valve stenosis. -Patient seen in consultation by cardiology and patient initially was on Cardizem drip and subsequently transition to oral Cardizem 30 mg every 6 hours.   -Patient was maintained on anticoagulation with Eliquis. -Cardiology discussed TEE/DCCV as an inpatient if unable to control versus DCCV as outpatient if rate controlled. -Patient currently not interested in the idea of a TEE cardioversion and per cardiology preferring outpatient DCCV after 3 weeks of uninterrupted anticoagulation. -Patient was followed by cardiology during the hospitalization will follow-up in the outpatient setting in A-fib clinic to be set up for DCCV.   2.  Demand ischemia/coronary artery calcifications -Patient noted to have three-vessel coronary artery calcifications on prior CTs. -EKG with mild diffuse ST depression felt likely rate related per cardiology. -High-sensitivity troponin elevated and per cardiology consistent  with demand ischemia. -Patient denied any chest pain.   -Patient maintained on statin.   -2D echo done with a EF of 60 to 65%, NWMA, mild LVH.  Mildly to moderately dilated left atrial size.  Mild  MVR no evidence of mitral stenosis.  Moderate TVR.  Mild aortic valve stenosis. -Patient maintained on anticoagulation of Eliquis for A-fib and as such was not started on aspirin. -Seen in consultation by cardiology during the hospitalization will follow-up in the outpatient setting.   3.  Hypertension -BP noted to be soft on Cardizem drip.   -Amlodipine and chlorthalidone discontinued per cardiology to allow room for Cardizem for rate control. -Patient subsequently transitioned from Cardizem drip to oral Cardizem 30 mg every 6 hours. -Patient's antihypertensive medications were discontinued on discharge with close outpatient follow-up with PCP.   4.  Hyperlipidemia -Patient maintained on home regimen statin.    5.  Hypokalemia -Repleted during the hospitalization.    6.  Right middle lobe density/irregularity -Outpatient follow-up with noncontrast CT in 2 to 3 weeks.  Procedures: 2D echo 01/21/2023 CT angiogram chest 01/20/2023 Chest x-ray 01/20/2023    Consultations: Cardiology: Dr. Duke Ramsey  Discharge Exam: Vitals:   01/22/23 1226 01/22/23 1439  BP: 124/70 117/73  Pulse: 75 73  Resp: 20 14  Temp: 98.5 F (36.9 C) 98.4 F (36.9 C)  SpO2: 99% 97%    General: NAD Cardiovascular: Irregularly irregular.  No murmurs rubs or gallops.  No JVD.  No lower extremity edema. Respiratory: Clear to auscultation bilaterally.  No wheezes, no crackles, no rhonchi.  Fair air movement.  Speaking in full sentences.  Discharge Instructions   Discharge Instructions     Amb Referral to AFIB Clinic   Complete by: As directed    Diet general   Complete by: As directed    Increase activity slowly   Complete by: As directed       Allergies as of 01/22/2023       Reactions   Other Other (See Comments)   Pt cannot take gel caps - med gets stuck in throat         Medication List     STOP taking these medications    amLODipine 2.5 MG tablet Commonly known as: NORVASC    chlorthalidone 25 MG tablet Commonly known as: HYGROTON       TAKE these medications    acetaminophen 325 MG tablet Commonly known as: TYLENOL Take 2 tablets (650 mg total) by mouth every 6 (six) hours as needed for mild pain (pain score 1-3) (or Fever >/= 101).   apixaban 2.5 MG Tabs tablet Commonly known as: ELIQUIS Take 1 tablet (2.5 mg total) by mouth 2 (two) times daily.   B-12 2500 MCG Tabs Take 2,500 mcg by mouth daily.   Benefiber Powd Take 4 g by mouth See admin instructions. Mix 4 grams (1 teaspoonful) of powder into 4-8 ounces of water and drink once a day   calcium carbonate 500 MG chewable tablet Commonly known as: TUMS - dosed in mg elemental calcium Chew 1-2 tablets by mouth as needed for indigestion or heartburn.   Chest Rub 4.8-1.2-2.6 % Oint Apply 1 application  topically See admin instructions. Vick's vaporub- Apply to the chest nightly, then cover   diltiazem 30 MG tablet Commonly known as: CARDIZEM Take 1 tablet (30 mg total) by mouth every 6 (six) hours.   fexofenadine 180 MG tablet Commonly known as: Allegra Allergy Take 1 tablet (180 mg total) by  mouth daily.   finasteride 5 MG tablet Commonly known as: PROSCAR Take 5 mg by mouth daily.   fluticasone 50 MCG/ACT nasal spray Commonly known as: FLONASE INSTILL 1 SPRAY INTO EACH NOSTRIL ONCE DAILY AS DIRECTED What changed: See the new instructions.   fluticasone-salmeterol 100-50 MCG/ACT Aepb Commonly known as: ADVAIR Inhale 1 puff into the lungs in the morning.   ipratropium 0.03 % nasal spray Commonly known as: ATROVENT INSTILL 2 SPRAYS INTO EACH NOSTRIL EVERY 12 HOURS AS DIRECTED What changed: See the new instructions.   NON FORMULARY Take 1 tablet by mouth See admin instructions. Vision Vite - Take 1 tablet by mouth with breakfast   polyethylene glycol 17 g packet Commonly known as: MIRALAX / GLYCOLAX Take 17 g by mouth daily as needed for mild constipation (mix as directed).    rosuvastatin 20 MG tablet Commonly known as: CRESTOR Take 20 mg by mouth daily.   Viactiv Calcium Plus D 650-12.5-40 MG-MCG Chew Generic drug: Calcium-Vitamin D-Vitamin K Chew 1 each by mouth daily.   Vitamin D3 50 MCG (2000 UT) Tabs Take 2,000 Units by mouth daily.       Allergies  Allergen Reactions   Other Other (See Comments)    Pt cannot take gel caps - med gets stuck in throat     Follow-up Information     Donald Ran, MD. Schedule an appointment as soon as possible for a visit in 2 week(s).   Specialty: Internal Medicine Contact information: [address] [place] [address] [phone]         Chilton Si, MD. Schedule an appointment as soon as possible for a visit in 2 week(s).   Specialty: Cardiology Why: Follow-up in 2 to 3 weeks.  Follow for A-fib. Contact information: [address] [place] [address] [phone]                  The results of significant diagnostics from this hospitalization (including imaging, microbiology, ancillary and laboratory) are listed below for reference.    Significant Diagnostic Studies: ECHOCARDIOGRAM COMPLETE  Result Date: 01/21/2023    ECHOCARDIOGRAM REPORT   Patient Name:   [name]. Date of Exam: 01/21/2023 Medical Rec #:  [id number]         Height:       67.0 in Accession #:    [id number]        Weight:       133.4 lb Date of Birth:  [birth year]          BSA:          1.702 m Patient Age:    89 years          BP:           99/61 mmHg Patient Gender: M                 HR:           61 bpm. Exam Location:  Inpatient Procedure: 2D Echo, Cardiac Doppler and Color Doppler Indications:    Atrial fibrillation  History:        Patient has no prior history of Echocardiogram examinations.                 Arrythmias:Atrial Fibrillation; Risk Factors:Hypertension and                 Dyslipidemia.  Sonographer:    Milda Smart Referring Phys: [id number] MIR M [facility]  Sonographer Comments:  Image  acquisition challenging due to patient body habitus. IMPRESSIONS  1. Left ventricular ejection fraction, by estimation, is 60 to 65%. The left ventricle has normal function. The left ventricle has no regional wall motion abnormalities. There is mild left ventricular hypertrophy. Left ventricular diastolic function could not be evaluated.  2. Right ventricular systolic function is normal. The right ventricular size is normal. There is mildly elevated pulmonary artery systolic pressure.  3. Left atrial size was mild to moderately dilated.  4. The mitral valve is grossly normal. Mild mitral valve regurgitation. No evidence of mitral stenosis.  5. Tricuspid valve regurgitation is moderate.  6. The aortic valve is tricuspid. There is mild calcification of the aortic valve. There is mild thickening of the aortic valve. Aortic valve regurgitation is not visualized. Mild aortic valve stenosis.  7. Aortic dilatation noted. There is borderline dilatation of the ascending aorta, measuring 38 mm.  8. The inferior vena cava is dilated in size with >50% respiratory variability, suggesting right atrial pressure of 8 mmHg. Comparison(s): No prior Echocardiogram. Conclusion(s)/Recommendation(s): Otherwise normal echocardiogram, with minor abnormalities described in the report. FINDINGS  Left Ventricle: Left ventricular ejection fraction, by estimation, is 60 to 65%. The left ventricle has normal function. The left ventricle has no regional wall motion abnormalities. The left ventricular internal cavity size was normal in size. There is  mild left ventricular hypertrophy. Left ventricular diastolic function could not be evaluated due to atrial fibrillation. Left ventricular diastolic function could not be evaluated. Right Ventricle: The right ventricular size is normal. Right vetricular wall thickness was not well visualized. Right ventricular systolic function is normal. There is mildly elevated pulmonary artery systolic  pressure. The tricuspid regurgitant velocity  is 2.78 m/s, and with an assumed right atrial pressure of 8 mmHg, the estimated right ventricular systolic pressure is 38.9 mmHg. Left Atrium: Left atrial size was mild to moderately dilated. Right Atrium: Right atrial size was normal in size. Pericardium: There is no evidence of pericardial effusion. Mitral Valve: The mitral valve is grossly normal. Mild mitral valve regurgitation. No evidence of mitral valve stenosis. Tricuspid Valve: The tricuspid valve is normal in structure. Tricuspid valve regurgitation is moderate . No evidence of tricuspid stenosis. Aortic Valve: The aortic valve is tricuspid. There is mild calcification of the aortic valve. There is mild thickening of the aortic valve. Aortic valve regurgitation is not visualized. Mild aortic stenosis is present. Aortic valve mean gradient measures  11.0 mmHg. Aortic valve peak gradient measures 21.2 mmHg. Aortic valve area, by VTI measures 1.37 cm. Pulmonic Valve: The pulmonic valve was grossly normal. Pulmonic valve regurgitation is not visualized. No evidence of pulmonic stenosis. Aorta: Aortic dilatation noted. There is borderline dilatation of the ascending aorta, measuring 38 mm. Venous: The inferior vena cava is dilated in size with greater than 50% respiratory variability, suggesting right atrial pressure of 8 mmHg. IAS/Shunts: The atrial septum is grossly normal.  LEFT VENTRICLE PLAX 2D LVOT diam:     1.80 cm   Diastology LV SV:         56        LV e' medial:  7.29 cm/s LV SV Index:   33        LV e' lateral: 5.22 cm/s LVOT Area:     2.54 cm  RIGHT VENTRICLE             IVC RV Basal diam:  2.93 cm     IVC diam: 2.30 cm RV S prime:  11.10 cm/s TAPSE (M-mode): 1.7 cm LEFT ATRIUM             Index        RIGHT ATRIUM           Index LA Vol (A2C):   51.4 ml 30.19 ml/m  RA Area:     15.10 cm LA Vol (A4C):   44.6 ml 26.20 ml/m  RA Volume:   34.00 ml  19.97 ml/m LA Biplane Vol: 49.2 ml 28.90 ml/m   AORTIC VALVE AV Area (Vmax):    1.28 cm AV Area (Vmean):   1.42 cm AV Area (VTI):     1.37 cm AV Vmax:           230.00 cm/s AV Vmean:          151.000 cm/s AV VTI:            0.411 m AV Peak Grad:      21.2 mmHg AV Mean Grad:      11.0 mmHg LVOT Vmax:         116.00 cm/s LVOT Vmean:        84.400 cm/s LVOT VTI:          0.221 m LVOT/AV VTI ratio: 0.54  AORTA Ao Root diam: 2.90 cm Ao Asc diam:  3.80 cm TRICUSPID VALVE TR Peak grad:   30.9 mmHg TR Mean grad:   22.0 mmHg TR Vmax:        278.00 cm/s TR Vmean:       227.0 cm/s  SHUNTS Systemic VTI:  0.22 m Systemic Diam: 1.80 cm Jodelle Red MD Electronically signed by Jodelle Red MD Signature Date/Time: 01/21/2023/5:00:53 PM    Final    CT Angio Chest PE W and/or Wo Contrast  Result Date: 01/20/2023 CLINICAL DATA:  Positive D-dimer level.  Dyspnea. EXAM: CT ANGIOGRAPHY CHEST WITH CONTRAST TECHNIQUE: Multidetector CT imaging of the chest was performed using the standard protocol during bolus administration of intravenous contrast. Multiplanar CT image reconstructions and MIPs were obtained to evaluate the vascular anatomy. RADIATION DOSE REDUCTION: This exam was performed according to the departmental dose-optimization program which includes automated exposure control, adjustment of the mA and/or kV according to patient size and/or use of iterative reconstruction technique. CONTRAST:  75mL OMNIPAQUE IOHEXOL 350 MG/ML SOLN COMPARISON:  December 17, 2022. FINDINGS: Cardiovascular: Satisfactory opacification of the pulmonary arteries to the segmental level. No evidence of pulmonary embolism. Normal heart size. No pericardial effusion. Coronary artery calcifications are noted. Mediastinum/Nodes: No enlarged mediastinal, hilar, or axillary lymph nodes. Thyroid gland, trachea, and esophagus demonstrate no significant findings. Lungs/Pleura: No pneumothorax or pleural effusion is noted. Minimal biapical scarring is noted. Stable 5 mm nodule seen in  left upper lobe best seen on image number 41 of series 12. Continued presence of opacity in right lower lobe consistent with radiation change. However, there are noted increased opacity seen involving the right middle lobe anterior to the major fissure suggesting possible inflammation or possibly malignancy. This area measures 11 x 7 mm best seen on image number 99 of series 12. Upper Abdomen: No acute abnormality. Musculoskeletal: No chest wall abnormality. No acute or significant osseous findings. Review of the MIP images confirms the above findings. IMPRESSION: No definite evidence of pulmonary embolus. Continued presence of opacity seen in right lower lobe consistent with radiation change. There is noted increased irregular density seen involving the right middle lobe anterior to the major fissure which measures 11 x 7 mm, concerning for  possible inflammation or malignancy. Follow-up unenhanced chest CT in 2-3 weeks is recommended to ensure resolution or stability. Stable 5 mm nodule seen in left upper lobe. Coronary artery calcifications are noted suggesting coronary artery disease. Aortic Atherosclerosis (ICD10-I70.0). Electronically Signed   By: Lupita Raider M.D.   On: 01/20/2023 16:45   DG Chest Portable 1 View  Result Date: 01/20/2023 CLINICAL DATA:  Dyspnea.  Tachycardia. EXAM: PORTABLE CHEST 1 VIEW COMPARISON:  CT scan chest from 12/15/2022. FINDINGS: There is an approximately 11 x 17 mm opacity overlying the right lower lung zone with several linear areas of atelectasis/scarring along its lateral aspect reaching up to the pleural surface. This corresponds to the nodule seen on the prior exam from 12/15/2022. Bilateral lung fields are otherwise clear. No acute consolidation or lung collapse. No pulmonary edema. Bilateral costophrenic angles are clear. No pneumothorax. Normal cardio-mediastinal silhouette. No acute osseous abnormalities. The soft tissues are within normal limits. IMPRESSION:  *Nodule overlying the right lower lung zone, corresponds to the opacity seen on the prior CT scan from 12/15/2022. *Otherwise no acute cardiopulmonary abnormality. Electronically Signed   By: Jules Schick M.D.   On: 01/20/2023 16:40    Microbiology: No results found for this or any previous visit (from the past 240 hour(s)).   Labs: Basic Metabolic Panel: Recent Labs  Lab 01/20/23 1347 01/21/23 0140 01/22/23 0539  NA 136 138 137  K 3.9 3.3* 3.9  CL 102 101 105  CO2 27 28 25   GLUCOSE 115* 143* 108*  BUN 35* 28* 34*  CREATININE 1.32* 1.19 1.47*  CALCIUM 9.4 9.5 9.0  MG 1.9 2.0 2.0   Liver Function Tests: Recent Labs  Lab 01/20/23 1347  AST 20  ALT 19  ALKPHOS 59  BILITOT 0.6  PROT 6.7  ALBUMIN 3.8   No results for input(s): "LIPASE", "AMYLASE" in the last 168 hours. No results for input(s): "AMMONIA" in the last 168 hours. CBC: Recent Labs  Lab 01/20/23 1347 01/21/23 0140 01/22/23 0539  WBC 6.6 8.9 5.7  NEUTROABS 5.3  --   --   HGB 11.5* 11.4* 10.0*  HCT 35.8* 36.3* 30.8*  MCV 99.4 100.0 100.0  PLT 231 236 199   Cardiac Enzymes: No results for input(s): "CKTOTAL", "CKMB", "CKMBINDEX", "TROPONINI" in the last 168 hours. BNP: BNP (last 3 results) Recent Labs    01/20/23 1347  BNP 186.9*    ProBNP (last 3 results) No results for input(s): "PROBNP" in the last 8760 hours.  CBG: No results for input(s): "GLUCAP" in the last 168 hours.     Signed:  Ramiro Harvest MD.  [facility] 01/22/2023, 3:08 PM

## 2023-01-22 NOTE — Progress Notes (Signed)
Mobility Specialist - Progress Note  Pre-mobility: 83 bpm HR,  During mobility: 115 bpm HR,  Post-mobility: 96 bpm HR,    01/22/23 0850  Mobility  Activity Ambulated independently in hallway  Level of Assistance Independent after set-up  Assistive Device None  Distance Ambulated (ft) 500 ft  Range of Motion/Exercises Active  Activity Response Tolerated well  Mobility Referral Yes  $Mobility charge 1 Mobility  Mobility Specialist Start Time (ACUTE ONLY) 0840  Mobility Specialist Stop Time (ACUTE ONLY) 0850  Mobility Specialist Time Calculation (min) (ACUTE ONLY) 10 min   Pt was found in bed and agreeable to ambulate. No complaints with session. At EOS returned to bed with all needs met. Call bell in reach and family in room.  Billey Chang Mobility Specialist

## 2023-01-22 NOTE — Plan of Care (Signed)
  Problem: Education: Goal: Knowledge of General Education information will improve Description: Including pain rating scale, medication(s)/side effects and non-pharmacologic comfort measures Outcome: Adequate for Discharge   Problem: Health Behavior/Discharge Planning: Goal: Ability to manage health-related needs will improve Outcome: Adequate for Discharge   Problem: Clinical Measurements: Goal: Ability to maintain clinical measurements within normal limits will improve Outcome: Adequate for Discharge Goal: Will remain free from infection Outcome: Adequate for Discharge Goal: Diagnostic test results will improve Outcome: Adequate for Discharge Goal: Respiratory complications will improve Outcome: Adequate for Discharge Goal: Cardiovascular complication will be avoided Outcome: Adequate for Discharge   Problem: Activity: Goal: Risk for activity intolerance will decrease Outcome: Adequate for Discharge   Problem: Nutrition: Goal: Adequate nutrition will be maintained Outcome: Adequate for Discharge   Problem: Coping: Goal: Level of anxiety will decrease Outcome: Adequate for Discharge   Problem: Elimination: Goal: Will not experience complications related to bowel motility Outcome: Adequate for Discharge Goal: Will not experience complications related to urinary retention Outcome: Adequate for Discharge   Problem: Pain Management: Goal: General experience of comfort will improve Outcome: Adequate for Discharge   Problem: Safety: Goal: Ability to remain free from injury will improve Outcome: Adequate for Discharge   Problem: Skin Integrity: Goal: Risk for impaired skin integrity will decrease Outcome: Adequate for Discharge   Problem: Acute Rehab PT Goals(only PT should resolve) Goal: Patient Will Transfer Sit To/From Stand Outcome: Adequate for Discharge Goal: Pt Will Ambulate Outcome: Adequate for Discharge

## 2023-01-22 NOTE — Progress Notes (Signed)
Nursing Discharge Note   Name: [name]. MRN: [id number] DOB: 08/05/1924    Admit Date: 01/20/2023  Discharge Date: 01/22/2023   [name]. is to be discharged home per MD order.  AVS completed. Reviewed with patient and family at bedside. Highlighted copy provided for patient to take home.  Patient/caregiver able to verbalize understanding of discharge instructions. PIV removed. Patient stable upon discharge.   New prescriptions of Eliquis and Diltazem obtained from [facility]. Provided to patient and reviewed next doses.  An After Visit Summary was printed and given to the patient. Reviewed in detail with the patient and his daughter/ [name] at bedside.   Discharge Instructions     Amb Referral to [facility]   Complete by: As directed    Diet general   Complete by: As directed    Increase activity slowly   Complete by: As directed          Discharge Instructions      Information on my medicine - ELIQUIS (apixaban)  This medication education was reviewed with me or my healthcare representative as part of my discharge preparation.    Why was Eliquis prescribed for you? Eliquis was prescribed for you to reduce the risk of a blood clot forming that can cause a stroke if you have a medical condition called atrial fibrillation (a type of irregular heartbeat).  What do You need to know about Eliquis ? Take your Eliquis TWICE DAILY - one tablet in the morning and one tablet in the evening with or without food. If you have difficulty swallowing the tablet whole please discuss with your pharmacist how to take the medication safely.  Take Eliquis exactly as prescribed by your doctor and DO NOT stop taking Eliquis without talking to the doctor who prescribed the medication.  Stopping may increase your risk of developing a stroke.  Refill your prescription before you run out.  After discharge, you should have regular check-up appointments  with your healthcare provider that is prescribing your Eliquis.  In the future your dose may need to be changed if your kidney function or weight changes by a significant amount or as you get older.  What do you do if you miss a dose? If you miss a dose, take it as soon as you remember on the same day and resume taking twice daily.  Do not take more than one dose of ELIQUIS at the same time to make up a missed dose.  Important Safety Information A possible side effect of Eliquis is bleeding. You should call your healthcare provider right away if you experience any of the following: Bleeding from an injury or your nose that does not stop. Unusual colored urine (red or dark brown) or unusual colored stools (red or black). Unusual bruising for unknown reasons. A serious fall or if you hit your head (even if there is no bleeding).  Some medicines may interact with Eliquis and might increase your risk of bleeding or clotting while on Eliquis. To help avoid this, consult your healthcare provider or pharmacist prior to using any new prescription or non-prescription medications, including herbals, vitamins, non-steroidal anti-inflammatory drugs (NSAIDs) and supplements.  This website has more information on Eliquis (apixaban): http://[email or web address].com/eliquis/home      Allergies as of 01/22/2023       Reactions   Other Other (See Comments)   Pt cannot take gel caps - med gets stuck in throat  Medication List     STOP taking these medications    amLODipine 2.5 MG tablet Commonly known as: NORVASC   chlorthalidone 25 MG tablet Commonly known as: HYGROTON       TAKE these medications    acetaminophen 325 MG tablet Commonly known as: TYLENOL Take 2 tablets (650 mg total) by mouth every 6 (six) hours as needed for mild pain (pain score 1-3) (or Fever >/= 101).   apixaban 2.5 MG Tabs tablet Commonly known as: ELIQUIS Take 1 tablet (2.5 mg total) by mouth 2 (two) times  daily.   B-12 2500 MCG Tabs Take 2,500 mcg by mouth daily.   Benefiber Powd Take 4 g by mouth See admin instructions. Mix 4 grams (1 teaspoonful) of powder into 4-8 ounces of water and drink once a day   calcium carbonate 500 MG chewable tablet Commonly known as: TUMS - dosed in mg elemental calcium Chew 1-2 tablets by mouth as needed for indigestion or heartburn.   Chest Rub 4.8-1.2-2.6 % Oint Apply 1 application  topically See admin instructions. Vick's vaporub- Apply to the chest nightly, then cover   diltiazem 30 MG tablet Commonly known as: CARDIZEM Take 1 tablet (30 mg total) by mouth every 6 (six) hours.   fexofenadine 180 MG tablet Commonly known as: Allegra Allergy Take 1 tablet (180 mg total) by mouth daily.   finasteride 5 MG tablet Commonly known as: PROSCAR Take 5 mg by mouth daily.   fluticasone 50 MCG/ACT nasal spray Commonly known as: FLONASE INSTILL 1 SPRAY INTO EACH NOSTRIL ONCE DAILY AS DIRECTED What changed: See the new instructions.   fluticasone-salmeterol 100-50 MCG/ACT Aepb Commonly known as: ADVAIR Inhale 1 puff into the lungs in the morning.   ipratropium 0.03 % nasal spray Commonly known as: ATROVENT INSTILL 2 SPRAYS INTO EACH NOSTRIL EVERY 12 HOURS AS DIRECTED What changed: See the new instructions.   NON FORMULARY Take 1 tablet by mouth See admin instructions. Vision Vite - Take 1 tablet by mouth with breakfast   polyethylene glycol 17 g packet Commonly known as: MIRALAX / GLYCOLAX Take 17 g by mouth daily as needed for mild constipation (mix as directed).   rosuvastatin 20 MG tablet Commonly known as: CRESTOR Take 20 mg by mouth daily.   Viactiv Calcium Plus D 650-12.5-40 MG-MCG Chew Generic drug: Calcium-Vitamin D-Vitamin K Chew 1 each by mouth daily.   Vitamin D3 50 MCG (2000 UT) Tabs Take 2,000 Units by mouth daily.         Discharge Instructions/ Education: Discharge instructions given to patient/family with verbalized  understanding. Discharge education completed with patient/family including: follow up instructions, medication list, discharge activities, and limitations if indicated. Additional discharge instructions as indicated by discharging provider also reviewed. Patient and family able to verbalize understanding, all questions fully answered. Patient instructed to return to Emergency Department, call 911, or call MD for any changes in condition.  Patient escorted via wheelchair to lobby and discharged home via private automobile.

## 2023-01-24 ENCOUNTER — Other Ambulatory Visit (HOSPITAL_COMMUNITY): Payer: Self-pay

## 2023-01-29 NOTE — Progress Notes (Addendum)
Cardiology Office Note    Date:  02/01/2023  ID:  Donald Royal., DOB November 25, 1924, MRN [id number] PCP:  [name], MD  Cardiologist:  Chilton Si, MD  Electrophysiologist:  None   Chief Complaint: Hospital follow up for atrial flutter   History of Present Illness: .    Donald Stolarz. is a 89 y.o. male with visit-pertinent history of coronary artery calcifications noted on prior CT scans, hypertension, hyperlipidemia, asthma, GERD, gout and prior lung cancer s/p radiation who was recently seen inpatient for evaluation of new onset atrial flutter.  On 01/20/2023 he presented to [facility] long ED for evaluation of weakness and tachycardia.  On chart review patient was in his normal state of health, then when he woke up that morning he felt very weak and tired.  He checked his blood pressure and noted that his heart rate was in the 140s, he denied any palpitations and was otherwise asymptomatic.  He denied chest pain or shortness of breath.  On arrival to the ED he was noted to be tachycardic with heart rate in the 140s but otherwise vital stable.  EKG showed atrial flutter with 2-1 AV conduction, rate 144 bpm with mild diffuse ST depressions.  High-sensitivity troponin 94>> 112, d-dimer negative when adjusted for age.  BNP mildly elevated at 186.  Chest x-ray showed a lung nodule overlying the right lower lung zone CTA negative for PE but did show increased irregular density in the right middle lobe anterior to the major fissure concerning for possible inflammation or malignancy as well as a stable 5 mm nodule seen in the left upper lobe.  His creatinine was 1.32, potassium 3.9 and magnesium 1.9, TSH was normal.  Echocardiogram on 01/21/2023 indicated LVEF 60 to 65%, LV with normal function, no RWMA, mild LVH, diastolic function could not be evaluated, mildly elevated pulmonary artery systolic pressure, left atrial size was mild to moderately dilated, there was mild a mitral valve regurgitation,  there is mild aortic valve stenosis. He was started on Eliquis 2.5 mg twice daily and diltiazem 180 mg daily.   Today he presents for follow-up with his daughter and son-in-law.  He reports that he is doing very well, he denies chest pain, shortness of breath, lower extremity edema or fatigue.  He is overall cardiac unaware, his only symptom when he was in atrial flutter was increased fatigue.  He reports that he has been tolerating his medications well, he has been monitoring his blood pressure at home and has been ranging in the 130s over 70s.   ROS: .   Today he denies chest pain, shortness of breath, lower extremity edema, fatigue, palpitations, melena, hematuria, hemoptysis, diaphoresis, weakness, presyncope, syncope, orthopnea, and PND.  All other systems are reviewed and otherwise negative. Studies Reviewed: Marland Kitchen    EKG:  EKG is ordered today, personally reviewed, demonstrating  EKG Interpretation Date/Time:  Tuesday February 01 2023 13:37:01 EST Ventricular Rate:  78 PR Interval:  160 QRS Duration:  82 QT Interval:  382 QTC Calculation: 435 R Axis:   44  Text Interpretation: Sinus rhythm with Premature supraventricular complexes Confirmed by Reather Littler [phone]) on 02/01/2023 2:10:34 PM   CV Studies:  Cardiac Studies & Procedures       ECHOCARDIOGRAM  ECHOCARDIOGRAM COMPLETE 01/21/2023  Narrative ECHOCARDIOGRAM REPORT    Patient Name:   Donald Blancarte. Date of Exam: 01/21/2023 Medical Rec #:  [id number]         Height:  67.0 in Accession #:    [id number]        Weight:       133.4 lb Date of Birth:  Feb 04, 1925          BSA:          1.702 m Patient Age:    89 years          BP:           99/61 mmHg Patient Gender: M                 HR:           61 bpm. Exam Location:  Inpatient  Procedure: 2D Echo, Cardiac Doppler and Color Doppler  Indications:    Atrial fibrillation  History:        Patient has no prior history of Echocardiogram examinations. Arrythmias:Atrial  Fibrillation; Risk Factors:Hypertension and Dyslipidemia.  Sonographer:    Milda Smart Referring Phys: [id number] MIR M [facility]   Sonographer Comments: Image acquisition challenging due to patient body habitus. IMPRESSIONS   1. Left ventricular ejection fraction, by estimation, is 60 to 65%. The left ventricle has normal function. The left ventricle has no regional wall motion abnormalities. There is mild left ventricular hypertrophy. Left ventricular diastolic function could not be evaluated. 2. Right ventricular systolic function is normal. The right ventricular size is normal. There is mildly elevated pulmonary artery systolic pressure. 3. Left atrial size was mild to moderately dilated. 4. The mitral valve is grossly normal. Mild mitral valve regurgitation. No evidence of mitral stenosis. 5. Tricuspid valve regurgitation is moderate. 6. The aortic valve is tricuspid. There is mild calcification of the aortic valve. There is mild thickening of the aortic valve. Aortic valve regurgitation is not visualized. Mild aortic valve stenosis. 7. Aortic dilatation noted. There is borderline dilatation of the ascending aorta, measuring 38 mm. 8. The inferior vena cava is dilated in size with >50% respiratory variability, suggesting right atrial pressure of 8 mmHg.  Comparison(s): No prior Echocardiogram.  Conclusion(s)/Recommendation(s): Otherwise normal echocardiogram, with minor abnormalities described in the report.  FINDINGS Left Ventricle: Left ventricular ejection fraction, by estimation, is 60 to 65%. The left ventricle has normal function. The left ventricle has no regional wall motion abnormalities. The left ventricular internal cavity size was normal in size. There is mild left ventricular hypertrophy. Left ventricular diastolic function could not be evaluated due to atrial fibrillation. Left ventricular diastolic function could not be evaluated.  Right Ventricle: The right  ventricular size is normal. Right vetricular wall thickness was not well visualized. Right ventricular systolic function is normal. There is mildly elevated pulmonary artery systolic pressure. The tricuspid regurgitant velocity is 2.78 m/s, and with an assumed right atrial pressure of 8 mmHg, the estimated right ventricular systolic pressure is 38.9 mmHg.  Left Atrium: Left atrial size was mild to moderately dilated.  Right Atrium: Right atrial size was normal in size.  Pericardium: There is no evidence of pericardial effusion.  Mitral Valve: The mitral valve is grossly normal. Mild mitral valve regurgitation. No evidence of mitral valve stenosis.  Tricuspid Valve: The tricuspid valve is normal in structure. Tricuspid valve regurgitation is moderate . No evidence of tricuspid stenosis.  Aortic Valve: The aortic valve is tricuspid. There is mild calcification of the aortic valve. There is mild thickening of the aortic valve. Aortic valve regurgitation is not visualized. Mild aortic stenosis is present. Aortic valve mean gradient measures 11.0 mmHg. Aortic valve peak gradient measures 21.2  mmHg. Aortic valve area, by VTI measures 1.37 cm.  Pulmonic Valve: The pulmonic valve was grossly normal. Pulmonic valve regurgitation is not visualized. No evidence of pulmonic stenosis.  Aorta: Aortic dilatation noted. There is borderline dilatation of the ascending aorta, measuring 38 mm.  Venous: The inferior vena cava is dilated in size with greater than 50% respiratory variability, suggesting right atrial pressure of 8 mmHg.  IAS/Shunts: The atrial septum is grossly normal.   LEFT VENTRICLE PLAX 2D LVOT diam:     1.80 cm   Diastology LV SV:         56        LV e' medial:  7.29 cm/s LV SV Index:   33        LV e' lateral: 5.22 cm/s LVOT Area:     2.54 cm   RIGHT VENTRICLE             IVC RV Basal diam:  2.93 cm     IVC diam: 2.30 cm RV S prime:     11.10 cm/s TAPSE (M-mode): 1.7 cm  LEFT  ATRIUM             Index        RIGHT ATRIUM           Index LA Vol (A2C):   51.4 ml 30.19 ml/m  RA Area:     15.10 cm LA Vol (A4C):   44.6 ml 26.20 ml/m  RA Volume:   34.00 ml  19.97 ml/m LA Biplane Vol: 49.2 ml 28.90 ml/m AORTIC VALVE AV Area (Vmax):    1.28 cm AV Area (Vmean):   1.42 cm AV Area (VTI):     1.37 cm AV Vmax:           230.00 cm/s AV Vmean:          151.000 cm/s AV VTI:            0.411 m AV Peak Grad:      21.2 mmHg AV Mean Grad:      11.0 mmHg LVOT Vmax:         116.00 cm/s LVOT Vmean:        84.400 cm/s LVOT VTI:          0.221 m LVOT/AV VTI ratio: 0.54  AORTA Ao Root diam: 2.90 cm Ao Asc diam:  3.80 cm  TRICUSPID VALVE TR Peak grad:   30.9 mmHg TR Mean grad:   22.0 mmHg TR Vmax:        278.00 cm/s TR Vmean:       227.0 cm/s  SHUNTS Systemic VTI:  0.22 m Systemic Diam: 1.80 cm  Jodelle Red MD Electronically signed by Jodelle Red MD Signature Date/Time: 01/21/2023/5:00:53 PM    Final              Current Reported Medications:.    Current Meds  Medication Sig   acetaminophen (TYLENOL) 325 MG tablet Take 2 tablets (650 mg total) by mouth every 6 (six) hours as needed for mild pain (pain score 1-3) (or Fever >/= 101).   calcium carbonate (TUMS - DOSED IN MG ELEMENTAL CALCIUM) 500 MG chewable tablet Chew 1-2 tablets by mouth as needed for indigestion or heartburn.   Calcium-Vitamin D-Vitamin K (VIACTIV CALCIUM PLUS D) 650-12.5-40 MG-MCG-MCG CHEW Chew 1 each by mouth daily.   Camphor-Eucalyptus-Menthol (CHEST RUB) 4.8-1.2-2.6 % OINT Apply 1 application  topically See admin instructions. Vick's vaporub- Apply to the chest nightly, then cover  Cholecalciferol (VITAMIN D3) 50 MCG (2000 UT) TABS Take 2,000 Units by mouth daily.   Cyanocobalamin (B-12) 2500 MCG TABS Take 2,500 mcg by mouth daily.   fexofenadine (ALLEGRA ALLERGY) 180 MG tablet Take 1 tablet (180 mg total) by mouth daily.   finasteride (PROSCAR) 5 MG tablet Take  5 mg by mouth daily.   fluticasone (FLONASE) 50 MCG/ACT nasal spray INSTILL 1 SPRAY INTO EACH NOSTRIL ONCE DAILY AS DIRECTED (Patient taking differently: Place 1 spray into both nostrils in the morning.)   fluticasone-salmeterol (ADVAIR) 100-50 MCG/ACT AEPB Inhale 1 puff into the lungs in the morning.   ipratropium (ATROVENT) 0.03 % nasal spray INSTILL 2 SPRAYS INTO EACH NOSTRIL EVERY 12 HOURS AS DIRECTED (Patient taking differently: Place 2 sprays into both nostrils every 12 (twelve) hours.)   NON FORMULARY Take 1 tablet by mouth See admin instructions. Vision Vite - Take 1 tablet by mouth with breakfast   polyethylene glycol (MIRALAX / GLYCOLAX) packet Take 17 g by mouth daily as needed for mild constipation (mix as directed).   rosuvastatin (CRESTOR) 20 MG tablet Take 20 mg by mouth daily.   Wheat Dextrin (BENEFIBER) POWD Take 4 g by mouth See admin instructions. Mix 4 grams (1 teaspoonful) of powder into 4-8 ounces of water and drink once a day   [DISCONTINUED] apixaban (ELIQUIS) 2.5 MG TABS tablet Take 1 tablet (2.5 mg total) by mouth 2 (two) times daily.   [DISCONTINUED] diltiazem (CARDIZEM) 30 MG tablet Take 1 tablet (30 mg total) by mouth every 6 (six) hours.    Physical Exam:    VS:  BP 136/72   Pulse 78   Ht 5\' 7"  (1.702 m)   Wt 139 lb 9.6 oz (63.3 kg)   SpO2 100%   BMI 21.86 kg/m    Wt Readings from Last 3 Encounters:  02/01/23 139 lb 9.6 oz (63.3 kg)  01/20/23 133 lb 6.1 oz (60.5 kg)  07/02/22 128 lb (58.1 kg)    GEN: Well nourished, well developed in no acute distress NECK: No JVD; No carotid bruits CARDIAC: RRR, no murmurs, rubs, gallops RESPIRATORY:  Clear to auscultation without rales, wheezing or rhonchi  ABDOMEN: Soft, non-tender, non-distended EXTREMITIES:  No edema; No acute deformity   Asessement and Plan:.    Atrial flutter: Patient presented to [facility] long ED on 01/20/2023 for evaluation of weakness and tachycardia, EKG on admission indicated atrial flutter  at 140 bpm, he was started on IV diltiazem with improvement in rates. Today his EKG shows sinus rhythm with premature supraventricular complexes at 78 bpm.  He denies feelings of any palpitations, notes that his only symptom when he was in atrial flutter was fatigue.  He reports that he is overall doing very well. CHA2DS2-VASc Score = 4 [CHF History: 0, HTN History: 1, Diabetes History: 0, Stroke History: 0, Vascular Disease History: 1, Age Score: 2, Gender Score: 0].  Therefore, the patient's annual risk of stroke is 4.8 %. Currently on Eliquis 2.5 mg twice daily given age, he denies any bleeding problems. Given no need for cardioversion at this time we will have patient wear a 2-week ZIO monitor to assess burden.  Reviewed ED precautions.  Check CBC and BMET. Continue Eliquis and diltiazem. Discussed changing Cardizem to 180 mg daily, patient deferred as he was unable to tolerate the pill while in the hospital, has difficulty swallowing some medications. Continue Eliquis 2.5 mg twice daily.  ADDENDUM: On review of patient chart and recent labs, weight noted to be  above 60 kg, he was previously started on Eliquis 2.5 mg twice daily given age and weight. On chart review he has typically weighed less than 60 kg. Day of office visit he was wearing boots and heavy coat. On follow up will reassess weight, if greater than 60 kg will plan to increase Eliquis to 5 mg twice daily.   Coronary artery calcification: Patient had three-vessel coronary artery calcifications noted on prior chest CTs for monitoring of his lung cancer. High sensitivity troponin 94>>112>> 108, consistent with demand ischemia. Today he denies chest pain or shortness of breath, he has remained stable with no anginal symptoms. No indication for ischemic evaluation.    Hypertension: Initial blood pressure today 148/74, on recheck was 136/72. He notes he regularly checks his blood pressure at home and averages 135/70's. Patient will continue to  monitor his blood pressure at home, if consistently elevated above 140/90 he will notify our office, will plan to start Losartan 12.5 mg daily.   Hyperlipidemia: Continue Crestor 20 mg daily.     Disposition: F/u with Dr. Duke Salvia or Reather Littler, NP in 6 weeks or sooner if needed.   Signed, Rip Harbour, NP

## 2023-02-01 ENCOUNTER — Ambulatory Visit (INDEPENDENT_AMBULATORY_CARE_PROVIDER_SITE_OTHER): Payer: Medicare Other

## 2023-02-01 ENCOUNTER — Encounter: Payer: Self-pay | Admitting: Cardiology

## 2023-02-01 ENCOUNTER — Ambulatory Visit: Payer: Medicare Other | Attending: Cardiology | Admitting: Cardiology

## 2023-02-01 VITALS — BP 136/72 | HR 78 | Ht 67.0 in | Wt 139.6 lb

## 2023-02-01 DIAGNOSIS — I10 Essential (primary) hypertension: Secondary | ICD-10-CM | POA: Insufficient documentation

## 2023-02-01 DIAGNOSIS — E785 Hyperlipidemia, unspecified: Secondary | ICD-10-CM | POA: Insufficient documentation

## 2023-02-01 DIAGNOSIS — I2510 Atherosclerotic heart disease of native coronary artery without angina pectoris: Secondary | ICD-10-CM | POA: Insufficient documentation

## 2023-02-01 DIAGNOSIS — I484 Atypical atrial flutter: Secondary | ICD-10-CM | POA: Insufficient documentation

## 2023-02-01 MED ORDER — DILTIAZEM HCL 30 MG PO TABS: 30.0000 mg | ORAL_TABLET | Freq: Four times a day (QID) | ORAL | 1 refills | Status: DC

## 2023-02-01 MED ORDER — APIXABAN 2.5 MG PO TABS: 2.5000 mg | ORAL_TABLET | Freq: Two times a day (BID) | ORAL | 1 refills | Status: DC

## 2023-02-01 NOTE — Patient Instructions (Signed)
Medication Instructions:  No changes *If you need a refill on your cardiac medications before your next appointment, please call your pharmacy*  Lab Work: Today we will draw BMP and CBC If you have labs (blood work) drawn today and your tests are completely normal, you will receive your results only by: MyChart Message (if you have MyChart) OR A paper copy in the mail If you have any lab test that is abnormal or we need to change your treatment, we will call you to review the results.  Testing/Procedures: [name]- Long Term Monitor Instructions  Your physician has requested you wear a ZIO patch monitor for 14 days.  This is a single patch monitor. [employer] supplies one patch monitor per enrollment. Additional stickers are not available. Please do not apply patch if you will be having a Nuclear Stress Test,  Echocardiogram, Cardiac CT, MRI, or Chest Xray during the period you would be wearing the  monitor. The patch cannot be worn during these tests. You cannot remove and re-apply the  ZIO XT patch monitor.  Your ZIO patch monitor will be mailed 3 day USPS to your address on file. It may take 3-5 days  to receive your monitor after you have been enrolled.  Once you have received your monitor, please review the enclosed instructions. Your monitor  has already been registered assigning a specific monitor serial # to you.  Billing and Patient Assistance Program Information  We have supplied [employer] with any of your insurance information on file for billing purposes. [employer] offers a sliding scale Patient Assistance Program for patients that do not have  insurance, or whose insurance does not completely cover the cost of the ZIO monitor.  You must apply for the Patient Assistance Program to qualify for this discounted rate.  To apply, please call [employer] at [phone], select option 4, select option 2, ask to apply for  Patient Assistance Program. Meredeth Ide will ask your household income,  and how many people  are in your household. They will quote your out-of-pocket cost based on that information.  [employer] will also be able to set up a 37-month, interest-free payment plan if needed.  Applying the monitor   Shave hair from upper left chest.  Hold abrader disc by orange tab. Rub abrader in 40 strokes over the upper left chest as  indicated in your monitor instructions.  Clean area with 4 enclosed alcohol pads. Let dry.  Apply patch as indicated in monitor instructions. Patch will be placed under collarbone on left  side of chest with arrow pointing upward.  Rub patch adhesive wings for 2 minutes. Remove white label marked "1". Remove the white  label marked "2". Rub patch adhesive wings for 2 additional minutes.  While looking in a mirror, press and release button in center of patch. A small green light will  flash 3-4 times. This will be your only indicator that the monitor has been turned on.  Do not shower for the first 24 hours. You may shower after the first 24 hours.  Press the button if you feel a symptom. You will hear a small click. Record Date, Time and  Symptom in the Patient Logbook.  When you are ready to remove the patch, follow instructions on the last 2 pages of Patient  Logbook. Stick patch monitor onto the last page of Patient Logbook.  Place Patient Logbook in the blue and white box. Use locking tab on box and tape box closed  securely. The blue  and white box has prepaid postage on it. Please place it in the mailbox as  soon as possible. Your physician should have your test results approximately 7 days after the  monitor has been mailed back to [facility].  Call [facility] Customer Care at [phone] if you have questions regarding  your ZIO XT patch monitor. Call them immediately if you see an orange light blinking on your  monitor.  If your monitor falls off in less than 4 days, contact our Monitor department at [phone].  If your monitor  becomes loose or falls off after 4 days call [employer] at [phone] for  suggestions on securing your monitor  Follow-Up: At [facility], you and your health needs are our priority.  As part of our continuing mission to provide you with exceptional heart care, we have created designated Provider Care Teams.  These Care Teams include your primary Cardiologist (physician) and Advanced Practice Providers (APPs -  Physician Assistants and Nurse Practitioners) who all work together to provide you with the care you need, when you need it.  We recommend signing up for the patient portal called "MyChart".  Sign up information is provided on this After Visit Summary.  MyChart is used to connect with patients for Virtual Visits (Telemedicine).  Patients are able to view lab/test results, encounter notes, upcoming appointments, etc.  Non-urgent messages can be sent to your provider as well.   To learn more about what you can do with MyChart, go to [email or web address].    Your next appointment:   6 week(s)  Provider:   Chilton Si, MD  or Reather Littler, NP  Other Instructions Please call our office if blood pressure is consistently above 140/90.

## 2023-02-01 NOTE — Progress Notes (Unsigned)
Enrolled patient for a 14 day Zio XT monitor to be mailed to patients home  [place] to read

## 2023-02-02 LAB — BASIC METABOLIC PANEL
BUN/Creatinine Ratio: 18 (ref 10–24)
BUN: 20 mg/dL (ref 10–36)
CO2: 26 mmol/L (ref 20–29)
Calcium: 9.7 mg/dL (ref 8.6–10.2)
Chloride: 104 mmol/L (ref 96–106)
Creatinine, Ser: 1.1 mg/dL (ref 0.76–1.27)
Glucose: 103 mg/dL — ABNORMAL HIGH (ref 70–99)
Potassium: 4.9 mmol/L (ref 3.5–5.2)
Sodium: 141 mmol/L (ref 134–144)
eGFR: 61 mL/min/{1.73_m2} (ref >59)

## 2023-02-02 LAB — CBC
Hematocrit: 30.8 % — ABNORMAL LOW (ref 37.5–51.0)
Hemoglobin: 10.1 g/dL — ABNORMAL LOW (ref 13.0–17.7)
MCH: 32.3 pg (ref 26.6–33.0)
MCHC: 32.8 g/dL (ref 31.5–35.7)
MCV: 98 fL — ABNORMAL HIGH (ref 79–97)
Platelets: 276 10*3/uL (ref 150–450)
RBC: 3.13 x10E6/uL — ABNORMAL LOW (ref 4.14–5.80)
RDW: 13.2 % (ref 11.6–15.4)
WBC: 7 10*3/uL (ref 3.4–10.8)

## 2023-02-04 ENCOUNTER — Telehealth: Payer: Self-pay

## 2023-02-04 NOTE — Telephone Encounter (Signed)
-----   Message from [facility] [place] sent at 02/03/2023  5:09 PM EST ----- Please let [name] know that his CBC shows that his anemia is close to baseline, there is no evidence of infection. His kidney function is normal and his electrolytes are normal. Overall good results, continue current medications and follow up as planned.

## 2023-02-04 NOTE — Telephone Encounter (Signed)
Called patient advised of below they verbalized understanding.

## 2023-02-06 DIAGNOSIS — I10 Essential (primary) hypertension: Secondary | ICD-10-CM | POA: Diagnosis not present

## 2023-02-06 DIAGNOSIS — I484 Atypical atrial flutter: Secondary | ICD-10-CM | POA: Diagnosis not present

## 2023-02-08 DIAGNOSIS — E8779 Other fluid overload: Secondary | ICD-10-CM | POA: Diagnosis not present

## 2023-02-08 DIAGNOSIS — R918 Other nonspecific abnormal finding of lung field: Secondary | ICD-10-CM | POA: Diagnosis not present

## 2023-02-08 DIAGNOSIS — I2510 Atherosclerotic heart disease of native coronary artery without angina pectoris: Secondary | ICD-10-CM | POA: Diagnosis not present

## 2023-02-08 DIAGNOSIS — R0982 Postnasal drip: Secondary | ICD-10-CM | POA: Diagnosis not present

## 2023-02-08 DIAGNOSIS — I480 Paroxysmal atrial fibrillation: Secondary | ICD-10-CM | POA: Diagnosis not present

## 2023-02-08 DIAGNOSIS — E876 Hypokalemia: Secondary | ICD-10-CM | POA: Diagnosis not present

## 2023-02-08 DIAGNOSIS — E785 Hyperlipidemia, unspecified: Secondary | ICD-10-CM | POA: Diagnosis not present

## 2023-02-08 DIAGNOSIS — I129 Hypertensive chronic kidney disease with stage 1 through stage 4 chronic kidney disease, or unspecified chronic kidney disease: Secondary | ICD-10-CM | POA: Diagnosis not present

## 2023-02-08 DIAGNOSIS — R911 Solitary pulmonary nodule: Secondary | ICD-10-CM | POA: Diagnosis not present

## 2023-02-09 ENCOUNTER — Other Ambulatory Visit (HOSPITAL_COMMUNITY): Payer: Self-pay

## 2023-02-09 ENCOUNTER — Other Ambulatory Visit: Payer: Self-pay

## 2023-02-09 MED ORDER — APIXABAN 2.5 MG PO TABS
2.5000 mg | ORAL_TABLET | Freq: Two times a day (BID) | ORAL | 1 refills | Status: DC
  Filled 2023-02-09 – 2023-03-03 (×2): qty 60, 30d supply, fill #0

## 2023-02-09 MED ORDER — DILTIAZEM HCL 30 MG PO TABS
30.0000 mg | ORAL_TABLET | Freq: Four times a day (QID) | ORAL | 1 refills | Status: DC
  Filled 2023-02-09 – 2023-02-24 (×2): qty 120, 30d supply, fill #0

## 2023-02-10 ENCOUNTER — Telehealth: Payer: Self-pay | Admitting: Cardiology

## 2023-02-10 ENCOUNTER — Other Ambulatory Visit (HOSPITAL_COMMUNITY): Payer: Self-pay

## 2023-02-10 NOTE — Telephone Encounter (Signed)
Patient calling the office for samples of medication:   1.  What medication and dosage are you requesting samples for? apixaban (ELIQUIS) 2.5 MG TABS tablet   2.  Are you currently out of this medication? No    

## 2023-02-11 NOTE — Telephone Encounter (Signed)
Tried calling patient, no answer.

## 2023-02-11 NOTE — Telephone Encounter (Signed)
Please find out how many more doses of Eliquis he has and why he needs samples

## 2023-02-14 MED ORDER — APIXABAN 2.5 MG PO TABS: 2.5000 mg | ORAL_TABLET | Freq: Two times a day (BID) | ORAL | 0 refills | Status: DC

## 2023-02-14 NOTE — Telephone Encounter (Addendum)
Noted  1 box = 7 days of prescription  RN will give 2 boxes of 2.5Mg  eliquis for 14 day supply   _________________________ Patient identification verified by 2 forms. Marilynn Rail, RN    Called and spoke to patients daughter Harriett Sine  Informed Harriett Sine 2 boxes of Eliquis 2.5mg  samples left for pick up at [facility] front office  Harriett Sine has no further questions at this time

## 2023-02-14 NOTE — Telephone Encounter (Signed)
Ok to give 8 days of Eliquis 5mg 

## 2023-02-14 NOTE — Addendum Note (Signed)
Addended by: Marilynn Rail on: 02/14/2023 10:44 AM   Modules accepted: Orders

## 2023-02-14 NOTE — Telephone Encounter (Signed)
Patient identification verified by 2 forms. Marilynn Rail, RN    Called and spoke to patients daughter Dorena Bodo states:   -Eliquis costs $500   -Does not have means to cover payment   -patient is short 8 days worth of medication for this month   -needs enough samples to bring him into the new year   -Patient takes 2.5mg  Eliquis twice a day  Informed Harriett Sine message sent for assistance  Harriett Sine verbalized understanding, no questions at this time

## 2023-02-15 DIAGNOSIS — I129 Hypertensive chronic kidney disease with stage 1 through stage 4 chronic kidney disease, or unspecified chronic kidney disease: Secondary | ICD-10-CM | POA: Diagnosis not present

## 2023-02-15 DIAGNOSIS — E876 Hypokalemia: Secondary | ICD-10-CM | POA: Diagnosis not present

## 2023-02-15 DIAGNOSIS — E8779 Other fluid overload: Secondary | ICD-10-CM | POA: Diagnosis not present

## 2023-02-15 DIAGNOSIS — I480 Paroxysmal atrial fibrillation: Secondary | ICD-10-CM | POA: Diagnosis not present

## 2023-02-24 ENCOUNTER — Other Ambulatory Visit (HOSPITAL_COMMUNITY): Payer: Self-pay

## 2023-03-01 DIAGNOSIS — I10 Essential (primary) hypertension: Secondary | ICD-10-CM | POA: Diagnosis not present

## 2023-03-01 DIAGNOSIS — I484 Atypical atrial flutter: Secondary | ICD-10-CM | POA: Diagnosis not present

## 2023-03-03 ENCOUNTER — Other Ambulatory Visit: Payer: Self-pay | Admitting: Family Medicine

## 2023-03-03 ENCOUNTER — Other Ambulatory Visit (HOSPITAL_COMMUNITY): Payer: Self-pay

## 2023-03-03 DIAGNOSIS — R918 Other nonspecific abnormal finding of lung field: Secondary | ICD-10-CM

## 2023-03-10 ENCOUNTER — Telehealth: Payer: Self-pay

## 2023-03-10 DIAGNOSIS — I491 Atrial premature depolarization: Secondary | ICD-10-CM

## 2023-03-10 MED ORDER — METOPROLOL TARTRATE 25 MG PO TABS: 25.0000 mg | ORAL_TABLET | Freq: Two times a day (BID) | ORAL | 3 refills | Status: DC

## 2023-03-10 NOTE — Telephone Encounter (Signed)
 Called patient advised of below they verbalized understanding Patient is compliant with both changes

## 2023-03-10 NOTE — Telephone Encounter (Signed)
-----   Message from [facility] [place] sent at 03/10/2023  1:13 PM EST ----- Please let Mr. Crumby know that his cardiac monitor shows that he is having very frequent episodes of short and fast heart beats that are originating from the top chambers of the heart. Recommend urgent referral to EP and start metoprolol  tartrate 25 mg twice daily. Will discuss results further at follow up on 1/16.

## 2023-03-17 ENCOUNTER — Ambulatory Visit: Payer: Medicare Other | Attending: Cardiovascular Disease | Admitting: Cardiovascular Disease

## 2023-03-17 ENCOUNTER — Ambulatory Visit: Payer: Medicare Other | Admitting: Cardiology

## 2023-03-17 ENCOUNTER — Encounter: Payer: Self-pay | Admitting: Cardiovascular Disease

## 2023-03-17 VITALS — BP 154/80 | HR 63 | Ht 67.0 in | Wt 139.0 lb

## 2023-03-17 DIAGNOSIS — I484 Atypical atrial flutter: Secondary | ICD-10-CM | POA: Insufficient documentation

## 2023-03-17 DIAGNOSIS — I4891 Unspecified atrial fibrillation: Secondary | ICD-10-CM | POA: Insufficient documentation

## 2023-03-17 DIAGNOSIS — I491 Atrial premature depolarization: Secondary | ICD-10-CM | POA: Diagnosis not present

## 2023-03-17 NOTE — Progress Notes (Signed)
  Electrophysiology Office Note:    Date:  03/17/2023   ID:  Donald Royal., DOB Jan 09, 1925, MRN [id number]  PCP:  [name], MD   [facility] Providers Cardiologist:  Chilton Si, MD     Referring MD: Rip Harbour, NP   History of Present Illness:    Donald Tivnan. is a 89 y.o. male with a medical history significant for atrial flutter, coronary artery calcification, referred for arrhythmia management.     He presented to [facility] in November 2024 with tachycardia.  He noticed the high heart rate on his blood pressure monitor.  He was slightly fatigued, possibly, but did not have significant symptoms despite the rapid heart rate, he reports today.  EKG showed atrial flutter with 2 1 AV conduction, ventricular rates 144 bpm.  He was put on IV diltiazem with improvement in V rates.  He was discharged on diltiazem.  In follow-up a 14-day ZIO monitor was placed with results described below  He does not have palpitations, no syncope, no presyncope.  He reports that he is functionally doing very well and has no complaints.     Today, he reports that he is doing very well and has no complaints  EKGs/Labs/Other Studies Reviewed Today:     Echocardiogram:  TTE November 2024 EF 60 to 65%.  Left atrium mildly to moderately dilated.   Monitors:  14 day monitor December 2024-- my interpretation Sinus rhythm predominates, heart rate 55 to 112 bpm, average 73 bpm 13.3% supraventricular ectopy, often in atrial runs. The longest was 17.2 seconds. No symptom episodes were reported    EKG:   EKG Interpretation Date/Time:  Thursday March 17 2023 15:41:11 EST Ventricular Rate:  63 PR Interval:  154 QRS Duration:  76 QT Interval:  426 QTC Calculation: 435 R Axis:   28  Text Interpretation: Sinus rhythm with occasional PACs with aberrant conduction When compared with ECG of 01-Feb-2023 13:37, No significant change was found Confirmed by [name]  [id number]) on 03/17/2023 3:59:58 PM     Physical Exam:    VS:  BP (!) 154/80 (BP Location: Left Arm, Patient Position: Sitting, Cuff Size: Normal)   Pulse 63   Ht 5\' 7"  (1.702 m)   Wt 139 lb (63 kg)   SpO2 96%   BMI 21.77 kg/m     Wt Readings from Last 3 Encounters:  03/17/23 139 lb (63 kg)  02/01/23 139 lb 9.6 oz (63.3 kg)  01/20/23 133 lb 6.1 oz (60.5 kg)     GEN: Well nourished, well developed in no acute distress CARDIAC: RRR, no murmurs, rubs, gallops RESPIRATORY:  Normal work of breathing MUSCULOSKELETAL: no edema    ASSESSMENT & PLAN:     Atrial flutter Single occurrence Currently on diltiazem and metoprolol; rates were controlled with diltiazem Continue Eliquis 2.5 mg twice daily He will continue to monitor by checking his blood pressure (and heart rate) daily  Nonsustained SVT Atrial runs Asymptomatic I do not think that aggressive intervention such as antiarrhythmic drug, ablation, or aggressive rate control is warranted given the brevity of episodes Discontinue diltiazem Continue metoprolol 25 mg twice daily      Signed, Maurice Small, MD  03/17/2023 4:19 PM    [facility]

## 2023-03-17 NOTE — Patient Instructions (Signed)
Medication Instructions:  STOP Diltiazem  *If you need a refill on your cardiac medications before your next appointment, please call your pharmacy*   Follow-Up: At [facility], you and your health needs are our priority.  As part of our continuing mission to provide you with exceptional heart care, we have created designated Provider Care Teams.  These Care Teams include your primary Cardiologist (physician) and Advanced Practice Providers (APPs -  Physician Assistants and Nurse Practitioners) who all work together to provide you with the care you need, when you need it.  We recommend signing up for the patient portal called "MyChart".  Sign up information is provided on this After Visit Summary.  MyChart is used to connect with patients for Virtual Visits (Telemedicine).  Patients are able to view lab/test results, encounter notes, upcoming appointments, etc.  Non-urgent messages can be sent to your provider as well.   To learn more about what you can do with MyChart, go to [email or web address].    Your next appointment:   1 year(s)  Provider:   York Pellant, MD

## 2023-03-20 NOTE — Progress Notes (Deleted)
Cardiology Office Note    Date:  03/20/2023  ID:  Donald Royal., DOB [birth year], MRN [id number] PCP:  [name], MD  Cardiologist:  Chilton Si, MD  Electrophysiologist:  None   Chief Complaint: ***  History of Present Illness: .    Donald Koegler. is a 89 y.o. male with visit-pertinent history of coronary artery calcifications noted on prior CT scans, hypertension, hyperlipidemia, asthma, GERD, gout and prior lung cancer s/p radiation who was recently seen inpatient for evaluation of new onset atrial flutter.   On 01/20/2023 he presented to [facility] long ED for evaluation of weakness and tachycardia.  On chart review patient was in his normal state of health, then when he woke up that morning he felt very weak and tired.  He checked his blood pressure and noted that his heart rate was in the 140s, he denied any palpitations and was otherwise asymptomatic.  He denied chest pain or shortness of breath.  On arrival to the ED he was noted to be tachycardic with heart rate in the 140s but otherwise vital stable.  EKG showed atrial flutter with 2-1 AV conduction, rate 144 bpm with mild diffuse ST depressions.  High-sensitivity troponin 94>> 112, d-dimer negative when adjusted for age.  BNP mildly elevated at 186.  Chest x-ray showed a lung nodule overlying the right lower lung zone CTA negative for PE but did show increased irregular density in the right middle lobe anterior to the major fissure concerning for possible inflammation or malignancy as well as a stable 5 mm nodule seen in the left upper lobe.  His creatinine was 1.32, potassium 3.9 and magnesium 1.9, TSH was normal.  Echocardiogram on 01/21/2023 indicated LVEF 60 to 65%, LV with normal function, no RWMA, mild LVH, diastolic function could not be evaluated, mildly elevated pulmonary artery systolic pressure, left atrial size was mild to moderately dilated, there was mild a mitral valve regurgitation, there is mild aortic valve  stenosis. He was started on Eliquis 2.5 mg twice daily and diltiazem 180 mg daily.   Patient was seen in office on 02/01/2023 for hospital follow-up.  He reported he was doing very well, denied chest pain, shortness of breath, lower extremity IMA or fatigue.  His EKG that day indicated sinus rhythm with premature supraventricular complexes.  It was discussed changing Cardizem to 180 mg daily however.  Patient deferred as he was unable to tolerate the larger pill due to swallowing difficulty.  He wore a 14-day ZIO monitor with an average heart rate of 73 bpm, minimum heart rate 55, maximum in sinus rhythm was 112 bpm, he had frequent PACs at 13.3%, 5.4% atrial couplets, 2.2% atrial triplets he had 2098 episodes of SVT, longest episode lasting 17.2 seconds fastest at 207 bpm.  He was referred to EP.  He was seen by Dr. Nelly Laurence on 03/17/2023, he noted numerous atrial runs however patient was asymptomatic.  Dr. Nelly Laurence felt that aggressive intervention such as antiarrhythmic drug, ablation or aggressive rate control is warranted given the brevity of episodes.  His diltiazem was discontinued and he was continued on metoprolol 25 mg twice daily.  Today he presents for follow-up.  He reports that he  Atrial flutter: Patient presented to [facility] long ED on 01/20/2023 for evaluation of weakness and tachycardia, EKG on admission indicated atrial flutter at 140 bpm, he was started on IV diltiazem with improvement in rates.  2-week cardiac monitor showed no repeat episodes of atrial fibrillation or atrial  flutter.   He denies feelings of any palpitations, notes that his only symptom when he was in atrial flutter was fatigue.   He reports that he is overall doing very well. CHA2DS2-VASc Score = 4 [CHF History: 0, HTN History: 1, Diabetes History: 0, Stroke History: 0, Vascular Disease History: 1, Age Score: 2, Gender Score: 0].  Therefore, the patient's annual risk of stroke is 4.8 %. Currently on Eliquis 2.5 mg twice daily  given age, he denies any bleeding problems.   Nonsustained SVT:    Coronary artery calcification: Patient had three-vessel coronary artery calcifications noted on prior chest CTs for monitoring of his lung cancer. High sensitivity troponin 94>>112>> 108, consistent with demand ischemia. Today he denies chest pain or shortness of breath, he has remained stable with no anginal symptoms. No indication for ischemic evaluation.    Hypertension: Initial blood pressure today 148/74, on recheck was 136/72. He notes he regularly checks his blood pressure at home and averages 135/70's. Patient will continue to monitor his blood pressure at home, if consistently elevated above 140/90 he will notify our office, will plan to start Losartan 12.5 mg daily.   Hyperlipidemia: Continue Crestor 20 mg daily.   Labwork independently reviewed: 02/18/2043: Sodium 141, potassium 4.9, creatinine 1.10, hemoglobin 10.1, hematocrit 30.8  ROS: .   *** denies chest pain, shortness of breath, lower extremity edema, fatigue, palpitations, melena, hematuria, hemoptysis, diaphoresis, weakness, presyncope, syncope, orthopnea, and PND.  All other systems are reviewed and otherwise negative.  Studies Reviewed: Marland Kitchen    EKG:  EKG is not ordered today.   CV Studies:  Cardiac Studies & Procedures      ECHOCARDIOGRAM  ECHOCARDIOGRAM COMPLETE 01/21/2023  Narrative ECHOCARDIOGRAM REPORT    Patient Name:   Donald Pastor. Date of Exam: 01/21/2023 Medical Rec #:  [id number]         Height:       67.0 in Accession #:    [id number]        Weight:       133.4 lb Date of Birth:  [birth year]          BSA:          1.702 m Patient Age:    89 years          BP:           99/61 mmHg Patient Gender: M                 HR:           61 bpm. Exam Location:  Inpatient  Procedure: 2D Echo, Cardiac Doppler and Color Doppler  Indications:    Atrial fibrillation  History:        Patient has no prior history of Echocardiogram  examinations. Arrythmias:Atrial Fibrillation; Risk Factors:Hypertension and Dyslipidemia.  Sonographer:    Milda Smart Referring Phys: [id number] MIR M [facility]   Sonographer Comments: Image acquisition challenging due to patient body habitus. IMPRESSIONS   1. Left ventricular ejection fraction, by estimation, is 60 to 65%. The left ventricle has normal function. The left ventricle has no regional wall motion abnormalities. There is mild left ventricular hypertrophy. Left ventricular diastolic function could not be evaluated. 2. Right ventricular systolic function is normal. The right ventricular size is normal. There is mildly elevated pulmonary artery systolic pressure. 3. Left atrial size was mild to moderately dilated. 4. The mitral valve is grossly normal. Mild mitral valve regurgitation. No evidence of mitral stenosis. 5. Tricuspid  valve regurgitation is moderate. 6. The aortic valve is tricuspid. There is mild calcification of the aortic valve. There is mild thickening of the aortic valve. Aortic valve regurgitation is not visualized. Mild aortic valve stenosis. 7. Aortic dilatation noted. There is borderline dilatation of the ascending aorta, measuring 38 mm. 8. The inferior vena cava is dilated in size with >50% respiratory variability, suggesting right atrial pressure of 8 mmHg.  Comparison(s): No prior Echocardiogram.  Conclusion(s)/Recommendation(s): Otherwise normal echocardiogram, with minor abnormalities described in the report.  FINDINGS Left Ventricle: Left ventricular ejection fraction, by estimation, is 60 to 65%. The left ventricle has normal function. The left ventricle has no regional wall motion abnormalities. The left ventricular internal cavity size was normal in size. There is mild left ventricular hypertrophy. Left ventricular diastolic function could not be evaluated due to atrial fibrillation. Left ventricular diastolic function could not be  evaluated.  Right Ventricle: The right ventricular size is normal. Right vetricular wall thickness was not well visualized. Right ventricular systolic function is normal. There is mildly elevated pulmonary artery systolic pressure. The tricuspid regurgitant velocity is 2.78 m/s, and with an assumed right atrial pressure of 8 mmHg, the estimated right ventricular systolic pressure is 38.9 mmHg.  Left Atrium: Left atrial size was mild to moderately dilated.  Right Atrium: Right atrial size was normal in size.  Pericardium: There is no evidence of pericardial effusion.  Mitral Valve: The mitral valve is grossly normal. Mild mitral valve regurgitation. No evidence of mitral valve stenosis.  Tricuspid Valve: The tricuspid valve is normal in structure. Tricuspid valve regurgitation is moderate . No evidence of tricuspid stenosis.  Aortic Valve: The aortic valve is tricuspid. There is mild calcification of the aortic valve. There is mild thickening of the aortic valve. Aortic valve regurgitation is not visualized. Mild aortic stenosis is present. Aortic valve mean gradient measures 11.0 mmHg. Aortic valve peak gradient measures 21.2 mmHg. Aortic valve area, by VTI measures 1.37 cm.  Pulmonic Valve: The pulmonic valve was grossly normal. Pulmonic valve regurgitation is not visualized. No evidence of pulmonic stenosis.  Aorta: Aortic dilatation noted. There is borderline dilatation of the ascending aorta, measuring 38 mm.  Venous: The inferior vena cava is dilated in size with greater than 50% respiratory variability, suggesting right atrial pressure of 8 mmHg.  IAS/Shunts: The atrial septum is grossly normal.   LEFT VENTRICLE PLAX 2D LVOT diam:     1.80 cm   Diastology LV SV:         56        LV e' medial:  7.29 cm/s LV SV Index:   33        LV e' lateral: 5.22 cm/s LVOT Area:     2.54 cm   RIGHT VENTRICLE             IVC RV Basal diam:  2.93 cm     IVC diam: 2.30 cm RV S prime:      11.10 cm/s TAPSE (M-mode): 1.7 cm  LEFT ATRIUM             Index        RIGHT ATRIUM           Index LA Vol (A2C):   51.4 ml 30.19 ml/m  RA Area:     15.10 cm LA Vol (A4C):   44.6 ml 26.20 ml/m  RA Volume:   34.00 ml  19.97 ml/m LA Biplane Vol: 49.2 ml 28.90 ml/m AORTIC VALVE AV  Area (Vmax):    1.28 cm AV Area (Vmean):   1.42 cm AV Area (VTI):     1.37 cm AV Vmax:           230.00 cm/s AV Vmean:          151.000 cm/s AV VTI:            0.411 m AV Peak Grad:      21.2 mmHg AV Mean Grad:      11.0 mmHg LVOT Vmax:         116.00 cm/s LVOT Vmean:        84.400 cm/s LVOT VTI:          0.221 m LVOT/AV VTI ratio: 0.54  AORTA Ao Root diam: 2.90 cm Ao Asc diam:  3.80 cm  TRICUSPID VALVE TR Peak grad:   30.9 mmHg TR Mean grad:   22.0 mmHg TR Vmax:        278.00 cm/s TR Vmean:       227.0 cm/s  SHUNTS Systemic VTI:  0.22 m Systemic Diam: 1.80 cm  Jodelle Red MD Electronically signed by Jodelle Red MD Signature Date/Time: 01/21/2023/5:00:53 PM    Final   MONITORS  LONG TERM MONITOR (3-14 DAYS) 03/01/2023  Narrative 14 Day Zio Monitor  Quality: Fair.  Baseline artifact. Predominant rhythm: Average heart rate: 73 bpm Max heart rate: 112 bpm in sinus rhythm Min heart rate: 55 bpm Pauses >2.5 seconds: none  Frequent PACs (13.3%).  5.4% atrial couplets.  2.2% atrial triplets. Rare (<1%) PVCs.  Many (2098) episodes of SVT.  Fasting 207 bpm.  Longest episode 17.2 seconds. Runs of SVT lasting up to 33 beats.  Tiffany C. Duke Salvia, MD, [facility] 03/09/2023 5:44 PM             Current Reported Medications:.    No outpatient medications have been marked as taking for the 03/23/23 encounter (Appointment) with Rip Harbour, NP.    Physical Exam:    VS:  There were no vitals taken for this visit.   Wt Readings from Last 3 Encounters:  03/17/23 139 lb (63 kg)  02/01/23 139 lb 9.6 oz (63.3 kg)  01/20/23 133 lb 6.1 oz (60.5 kg)    GEN: Well  nourished, well developed in no acute distress NECK: No JVD; No carotid bruits CARDIAC: ***RRR, no murmurs, rubs, gallops RESPIRATORY:  Clear to auscultation without rales, wheezing or rhonchi  ABDOMEN: Soft, non-tender, non-distended EXTREMITIES:  No edema; No acute deformity   Asessement and Plan:.     ***     Disposition: F/u with ***  Signed, Rip Harbour, NP

## 2023-03-22 ENCOUNTER — Ambulatory Visit
Admission: RE | Admit: 2023-03-22 | Discharge: 2023-03-22 | Disposition: A | Discharge status: Home / Self Care | Payer: Medicare Other | Source: Ambulatory Visit | Attending: Family Medicine | Admitting: Family Medicine

## 2023-03-22 DIAGNOSIS — R918 Other nonspecific abnormal finding of lung field: Secondary | ICD-10-CM

## 2023-03-22 DIAGNOSIS — J9811 Atelectasis: Secondary | ICD-10-CM | POA: Diagnosis not present

## 2023-03-22 DIAGNOSIS — J90 Pleural effusion, not elsewhere classified: Secondary | ICD-10-CM | POA: Diagnosis not present

## 2023-03-22 DIAGNOSIS — I700 Atherosclerosis of aorta: Secondary | ICD-10-CM | POA: Diagnosis not present

## 2023-03-23 ENCOUNTER — Encounter: Payer: Self-pay | Admitting: Cardiology

## 2023-03-23 ENCOUNTER — Ambulatory Visit: Payer: Medicare Other | Attending: Cardiology | Admitting: Cardiology

## 2023-03-23 ENCOUNTER — Ambulatory Visit: Payer: Medicare Other | Admitting: Cardiology

## 2023-03-23 VITALS — BP 148/82 | HR 60 | Ht 67.0 in | Wt 142.8 lb

## 2023-03-23 DIAGNOSIS — I2510 Atherosclerotic heart disease of native coronary artery without angina pectoris: Secondary | ICD-10-CM

## 2023-03-23 DIAGNOSIS — E785 Hyperlipidemia, unspecified: Secondary | ICD-10-CM | POA: Diagnosis not present

## 2023-03-23 DIAGNOSIS — I484 Atypical atrial flutter: Secondary | ICD-10-CM

## 2023-03-23 DIAGNOSIS — I10 Essential (primary) hypertension: Secondary | ICD-10-CM | POA: Diagnosis not present

## 2023-03-23 DIAGNOSIS — I471 Supraventricular tachycardia, unspecified: Secondary | ICD-10-CM | POA: Diagnosis not present

## 2023-03-23 DIAGNOSIS — I491 Atrial premature depolarization: Secondary | ICD-10-CM

## 2023-03-23 MED ORDER — APIXABAN 5 MG PO TABS: 5.0000 mg | ORAL_TABLET | Freq: Two times a day (BID) | ORAL | 3 refills | Status: AC

## 2023-03-23 NOTE — Progress Notes (Signed)
Cardiology Office Note    Date:  03/23/2023  ID:  Donald Derousse., DOB May 07, 1924, MRN [id number] PCP:  [name], MD  Cardiologist:  Chilton Si, MD  Electrophysiologist:  None   Chief Complaint: Follow up for SVT/Atrial flutter   History of Present Illness: .    Donald Mazzaferro. is a 89 y.o. male with visit-pertinent history of coronary artery calcifications noted on prior CT scans, hypertension, hyperlipidemia, asthma, GERD, gout and prior lung cancer s/p radiation who was recently seen inpatient for evaluation of new onset atrial flutter.   On 01/20/2023 he presented to [facility] long ED for evaluation of weakness and tachycardia.  On chart review patient was in his normal state of health, then when he woke up that morning he felt very weak and tired.  He checked his blood pressure and noted that his heart rate was in the 140s, he denied any palpitations and was otherwise asymptomatic.  He denied chest pain or shortness of breath.  On arrival to the ED he was noted to be tachycardic with heart rate in the 140s but otherwise vital stable.  EKG showed atrial flutter with 2-1 AV conduction, rate 144 bpm with mild diffuse ST depressions.  High-sensitivity troponin 94>> 112, d-dimer negative when adjusted for age.  BNP mildly elevated at 186.  Chest x-ray showed a lung nodule overlying the right lower lung zone CTA negative for PE but did show increased irregular density in the right middle lobe anterior to the major fissure concerning for possible inflammation or malignancy as well as a stable 5 mm nodule seen in the left upper lobe.  His creatinine was 1.32, potassium 3.9 and magnesium 1.9, TSH was normal.  Echocardiogram on 01/21/2023 indicated LVEF 60 to 65%, LV with normal function, no RWMA, mild LVH, diastolic function could not be evaluated, mildly elevated pulmonary artery systolic pressure, left atrial size was mild to moderately dilated, there was mild a mitral valve regurgitation,  there is mild aortic valve stenosis. He was started on Eliquis 2.5 mg twice daily and diltiazem 180 mg daily.   Patient was seen in office on 02/01/2023 for hospital follow-up.  He reported he was doing very well, denied chest pain, shortness of breath, lower extremity IMA or fatigue.  His EKG that day indicated sinus rhythm with premature supraventricular complexes.  It was discussed changing Cardizem to 180 mg daily however.  Patient deferred as he was unable to tolerate the larger pill due to swallowing difficulty.  He wore a 14-day ZIO monitor with an average heart rate of 73 bpm, minimum heart rate 55, maximum in sinus rhythm was 112 bpm, he had frequent PACs at 13.3%, 5.4% atrial couplets, 2.2% atrial triplets he had 2098 episodes of SVT, longest episode lasting 17.2 seconds fastest at 207 bpm.  He was referred to EP.  He was seen by Dr. Nelly Laurence on 03/17/2023, he noted numerous atrial runs however patient was asymptomatic.  Dr. Nelly Laurence felt that aggressive intervention such as antiarrhythmic drug, ablation or aggressive rate control is warranted given the brevity of episodes.  His diltiazem was discontinued and he was continued on metoprolol 25 mg twice daily.  Today he presents for follow-up.  He reports that he is doing well. He denies chest pain or shorntess of breath.  He denies any palpitations or feeling of irregular heart rate.  He reports that he remains very active, he denies any falls.  He denies any problems with bleeding since starting on  Eliquis.  He continues to monitor his blood pressure and heart rate at home, notes both are very well-controlled.  He denies any presyncope or syncope.  Labwork independently reviewed: 02/18/2043: Sodium 141, potassium 4.9, creatinine 1.10, hemoglobin 10.1, hematocrit 30.8  ROS: .   Today he denies chest pain, shortness of breath, lower extremity edema, fatigue, palpitations, melena, hematuria, hemoptysis, diaphoresis, weakness, presyncope, syncope,  orthopnea, and PND.  All other systems are reviewed and otherwise negative. Studies Reviewed: Marland Kitchen    EKG:  EKG is not ordered today.   CV Studies:  Cardiac Studies & Procedures      ECHOCARDIOGRAM  ECHOCARDIOGRAM COMPLETE 01/21/2023  Narrative ECHOCARDIOGRAM REPORT    Patient Name:   Donald Wingerter. Date of Exam: 01/21/2023 Medical Rec #:  [id number]         Height:       67.0 in Accession #:    [id number]        Weight:       133.4 lb Date of Birth:  [birth year]          BSA:          1.702 m Patient Age:    89 years          BP:           99/61 mmHg Patient Gender: M                 HR:           61 bpm. Exam Location:  Inpatient  Procedure: 2D Echo, Cardiac Doppler and Color Doppler  Indications:    Atrial fibrillation  History:        Patient has no prior history of Echocardiogram examinations. Arrythmias:Atrial Fibrillation; Risk Factors:Hypertension and Dyslipidemia.  Sonographer:    Milda Smart Referring Phys: [id number] MIR M [facility]   Sonographer Comments: Image acquisition challenging due to patient body habitus. IMPRESSIONS   1. Left ventricular ejection fraction, by estimation, is 60 to 65%. The left ventricle has normal function. The left ventricle has no regional wall motion abnormalities. There is mild left ventricular hypertrophy. Left ventricular diastolic function could not be evaluated. 2. Right ventricular systolic function is normal. The right ventricular size is normal. There is mildly elevated pulmonary artery systolic pressure. 3. Left atrial size was mild to moderately dilated. 4. The mitral valve is grossly normal. Mild mitral valve regurgitation. No evidence of mitral stenosis. 5. Tricuspid valve regurgitation is moderate. 6. The aortic valve is tricuspid. There is mild calcification of the aortic valve. There is mild thickening of the aortic valve. Aortic valve regurgitation is not visualized. Mild aortic valve stenosis. 7. Aortic  dilatation noted. There is borderline dilatation of the ascending aorta, measuring 38 mm. 8. The inferior vena cava is dilated in size with >50% respiratory variability, suggesting right atrial pressure of 8 mmHg.  Comparison(s): No prior Echocardiogram.  Conclusion(s)/Recommendation(s): Otherwise normal echocardiogram, with minor abnormalities described in the report.  FINDINGS Left Ventricle: Left ventricular ejection fraction, by estimation, is 60 to 65%. The left ventricle has normal function. The left ventricle has no regional wall motion abnormalities. The left ventricular internal cavity size was normal in size. There is mild left ventricular hypertrophy. Left ventricular diastolic function could not be evaluated due to atrial fibrillation. Left ventricular diastolic function could not be evaluated.  Right Ventricle: The right ventricular size is normal. Right vetricular wall thickness was not well visualized. Right ventricular systolic function is normal. There is mildly  elevated pulmonary artery systolic pressure. The tricuspid regurgitant velocity is 2.78 m/s, and with an assumed right atrial pressure of 8 mmHg, the estimated right ventricular systolic pressure is 38.9 mmHg.  Left Atrium: Left atrial size was mild to moderately dilated.  Right Atrium: Right atrial size was normal in size.  Pericardium: There is no evidence of pericardial effusion.  Mitral Valve: The mitral valve is grossly normal. Mild mitral valve regurgitation. No evidence of mitral valve stenosis.  Tricuspid Valve: The tricuspid valve is normal in structure. Tricuspid valve regurgitation is moderate . No evidence of tricuspid stenosis.  Aortic Valve: The aortic valve is tricuspid. There is mild calcification of the aortic valve. There is mild thickening of the aortic valve. Aortic valve regurgitation is not visualized. Mild aortic stenosis is present. Aortic valve mean gradient measures 11.0 mmHg. Aortic valve  peak gradient measures 21.2 mmHg. Aortic valve area, by VTI measures 1.37 cm.  Pulmonic Valve: The pulmonic valve was grossly normal. Pulmonic valve regurgitation is not visualized. No evidence of pulmonic stenosis.  Aorta: Aortic dilatation noted. There is borderline dilatation of the ascending aorta, measuring 38 mm.  Venous: The inferior vena cava is dilated in size with greater than 50% respiratory variability, suggesting right atrial pressure of 8 mmHg.  IAS/Shunts: The atrial septum is grossly normal.   LEFT VENTRICLE PLAX 2D LVOT diam:     1.80 cm   Diastology LV SV:         56        LV e' medial:  7.29 cm/s LV SV Index:   33        LV e' lateral: 5.22 cm/s LVOT Area:     2.54 cm   RIGHT VENTRICLE             IVC RV Basal diam:  2.93 cm     IVC diam: 2.30 cm RV S prime:     11.10 cm/s TAPSE (M-mode): 1.7 cm  LEFT ATRIUM             Index        RIGHT ATRIUM           Index LA Vol (A2C):   51.4 ml 30.19 ml/m  RA Area:     15.10 cm LA Vol (A4C):   44.6 ml 26.20 ml/m  RA Volume:   34.00 ml  19.97 ml/m LA Biplane Vol: 49.2 ml 28.90 ml/m AORTIC VALVE AV Area (Vmax):    1.28 cm AV Area (Vmean):   1.42 cm AV Area (VTI):     1.37 cm AV Vmax:           230.00 cm/s AV Vmean:          151.000 cm/s AV VTI:            0.411 m AV Peak Grad:      21.2 mmHg AV Mean Grad:      11.0 mmHg LVOT Vmax:         116.00 cm/s LVOT Vmean:        84.400 cm/s LVOT VTI:          0.221 m LVOT/AV VTI ratio: 0.54  AORTA Ao Root diam: 2.90 cm Ao Asc diam:  3.80 cm  TRICUSPID VALVE TR Peak grad:   30.9 mmHg TR Mean grad:   22.0 mmHg TR Vmax:        278.00 cm/s TR Vmean:       227.0 cm/s  SHUNTS Systemic VTI:  0.22 m Systemic Diam: 1.80 cm  Donald Red MD Electronically signed by Donald Red MD Signature Date/Time: 01/21/2023/5:00:53 PM    Final   MONITORS  LONG TERM MONITOR (3-14 DAYS) 03/01/2023  Narrative 14 Day Zio Monitor  Quality: Fair.   Baseline artifact. Predominant rhythm: Average heart rate: 73 bpm Max heart rate: 112 bpm in sinus rhythm Min heart rate: 55 bpm Pauses >2.5 seconds: none  Frequent PACs (13.3%).  5.4% atrial couplets.  2.2% atrial triplets. Rare (<1%) PVCs.  Many (2098) episodes of SVT.  Fasting 207 bpm.  Longest episode 17.2 seconds. Runs of SVT lasting up to 33 beats.  Tiffany C. Duke Salvia, MD, [facility] 03/09/2023 5:44 PM             Current Reported Medications:.    Current Meds  Medication Sig   acetaminophen (TYLENOL) 325 MG tablet Take 2 tablets (650 mg total) by mouth every 6 (six) hours as needed for mild pain (pain score 1-3) (or Fever >/= 101).   apixaban (ELIQUIS) 5 MG TABS tablet Take 1 tablet (5 mg total) by mouth 2 (two) times daily.   calcium carbonate (TUMS - DOSED IN MG ELEMENTAL CALCIUM) 500 MG chewable tablet Chew 1-2 tablets by mouth as needed for indigestion or heartburn.   Calcium-Vitamin D-Vitamin K (VIACTIV CALCIUM PLUS D) 650-12.5-40 MG-MCG-MCG CHEW Chew 1 each by mouth daily.   Camphor-Eucalyptus-Menthol (CHEST RUB) 4.8-1.2-2.6 % OINT Apply 1 application  topically See admin instructions. Vick's vaporub- Apply to the chest nightly, then cover   Cholecalciferol (VITAMIN D3) 50 MCG (2000 UT) TABS Take 2,000 Units by mouth daily.   Cyanocobalamin (B-12) 2500 MCG TABS Take 2,500 mcg by mouth daily.   fexofenadine (ALLEGRA ALLERGY) 180 MG tablet Take 1 tablet (180 mg total) by mouth daily.   finasteride (PROSCAR) 5 MG tablet Take 5 mg by mouth daily.   fluticasone (FLONASE) 50 MCG/ACT nasal spray INSTILL 1 SPRAY INTO EACH NOSTRIL ONCE DAILY AS DIRECTED (Patient taking differently: Place 1 spray into both nostrils in the morning.)   fluticasone-salmeterol (ADVAIR) 100-50 MCG/ACT AEPB Inhale 1 puff into the lungs in the morning.   ipratropium (ATROVENT) 0.03 % nasal spray INSTILL 2 SPRAYS INTO EACH NOSTRIL EVERY 12 HOURS AS DIRECTED (Patient taking differently: Place 2 sprays into both  nostrils every 12 (twelve) hours.)   metoprolol tartrate (LOPRESSOR) 25 MG tablet Take 1 tablet (25 mg total) by mouth 2 (two) times daily.   NON FORMULARY Take 1 tablet by mouth See admin instructions. Vision Vite - Take 1 tablet by mouth with breakfast   polyethylene glycol (MIRALAX / GLYCOLAX) packet Take 17 g by mouth daily as needed for mild constipation (mix as directed).   rosuvastatin (CRESTOR) 20 MG tablet Take 20 mg by mouth daily.   Wheat Dextrin (BENEFIBER) POWD Take 4 g by mouth See admin instructions. Mix 4 grams (1 teaspoonful) of powder into 4-8 ounces of water and drink once a day   [DISCONTINUED] apixaban (ELIQUIS) 2.5 MG TABS tablet Take 1 tablet (2.5 mg total) by mouth 2 (two) times daily.   [DISCONTINUED] apixaban (ELIQUIS) 2.5 MG TABS tablet Take 1 tablet (2.5 mg total) by mouth 2 (two) times daily.   [DISCONTINUED] apixaban (ELIQUIS) 2.5 MG TABS tablet Take 1 tablet (2.5 mg total) by mouth 2 (two) times daily.    Physical Exam:    VS:  BP (!) 148/82   Pulse 60   Ht 5\' 7"  (1.702 m)   Wt 142 lb 12.8  oz (64.8 kg)   SpO2 97%   BMI 22.37 kg/m    Wt Readings from Last 3 Encounters:  03/23/23 142 lb 12.8 oz (64.8 kg)  03/17/23 139 lb (63 kg)  02/01/23 139 lb 9.6 oz (63.3 kg)    GEN: Well nourished, well developed in no acute distress NECK: No JVD; No carotid bruits CARDIAC: RRR, no murmurs, rubs, gallops RESPIRATORY:  Clear to auscultation without rales, wheezing or rhonchi  ABDOMEN: Soft, non-tender, non-distended EXTREMITIES:  No edema; No acute deformity   Asessement and Plan:.    Atrial flutter: Patient presented to [facility] long ED on 01/20/2023 for evaluation of weakness and tachycardia, EKG on admission indicated atrial flutter at 140 bpm, he was started on IV diltiazem with improvement in rates.  2-week cardiac monitor showed no repeat episodes of atrial fibrillation or atrial flutter. He denies feelings of any palpitations, notes that his only symptom when he  was in atrial flutter was fatigue.  Today he reports that he is doing well.  He denies any feelings of palpitations or irregular heartbeats.  He continues to monitor his heart rate with his blood pressure cuff at home.  He denies any bleeding problems on Eliquis.  CHA2DS2-VASc Score = 4 [CHF History: 0, HTN History: 1, Diabetes History: 0, Stroke History: 0, Vascular Disease History: 1, Age Score: 2, Gender Score: 0].  Therefore, the patient's annual risk of stroke is 4.8 %.  Patient has noted weight gain at home, he notes that he typically gains weight during the winter months.  Patient's weight has been consistently above 60 kg for last 3 office visits, his last creatinine was 1.10.  Will increase his Eliquis to 5 mg twice daily.  Continue metoprolol tartrate 25 mg twice daily.  Nonsustained SVT: 2-week cardiac monitor indicated 2098 episodes of SVT, longest episode lasting 17.2 seconds fastest at 207 bpm.  He was referred to EP.  He was seen by Dr. Nelly Laurence on 03/17/2023, he noted numerous atrial runs however patient was asymptomatic.  Dr. Nelly Laurence felt that aggressive intervention such as antiarrhythmic drug, ablation or aggressive rate control is warranted given the brevity of episodes.  Patient continues to deny any symptoms, reports that he is tolerating metoprolol tartrate well.  Reviewed ED precautions.  Continue metoprolol tartrate 25 mg twice daily.   Coronary artery calcification: Patient had three-vessel coronary artery calcifications noted on prior chest CTs for monitoring of his lung cancer. High sensitivity troponin 94>>112>> 108, consistent with demand ischemia. Today he denies chest pain or shortness of breath, he has remained stable with no anginal symptoms. No indication for ischemic evaluation.    Hypertension: Initial blood pressure today 169/78, on recheck was 148/82. He notes he regularly checks his blood pressure at home and averages in the 130s/70s.. Patient will continue to monitor his  blood pressure at home, if consistently elevated above 140/90 he will notify our office, will plan to start Losartan or restart amlodipine.  Hyperlipidemia: Continue Crestor 20 mg daily.     Disposition: F/u with Dr. Duke Salvia or Reather Littler, NP in 3-4 months or sooner if needed.   Signed, Rip Harbour, NP

## 2023-03-23 NOTE — Patient Instructions (Signed)
Medication Instructions:  Increase Eliquis to 5 mg twice a day *If you need a refill on your cardiac medications before your next appointment, please call your pharmacy*  Lab Work: No labs  Testing/Procedures: No testing  Follow-Up: At [facility], you and your health needs are our priority.  As part of our continuing mission to provide you with exceptional heart care, we have created designated Provider Care Teams.  These Care Teams include your primary Cardiologist (physician) and Advanced Practice Providers (APPs -  Physician Assistants and Nurse Practitioners) who all work together to provide you with the care you need, when you need it.  We recommend signing up for the patient portal called "MyChart".  Sign up information is provided on this After Visit Summary.  MyChart is used to connect with patients for Virtual Visits (Telemedicine).  Patients are able to view lab/test results, encounter notes, upcoming appointments, etc.  Non-urgent messages can be sent to your provider as well.   To learn more about what you can do with MyChart, go to [email or web address].    Your next appointment:   3-4 month(s)  Provider:   Chilton Si, MD  or Reather Littler, NP

## 2023-03-28 ENCOUNTER — Ambulatory Visit
Admission: RE | Admit: 2023-03-28 | Discharge: 2023-03-28 | Disposition: A | Discharge status: Home / Self Care | Payer: Medicare Other | Source: Ambulatory Visit | Attending: Radiation Oncology | Admitting: Radiation Oncology

## 2023-03-28 ENCOUNTER — Encounter: Payer: Self-pay | Admitting: Radiation Oncology

## 2023-03-28 DIAGNOSIS — C3431 Malignant neoplasm of lower lobe, right bronchus or lung: Secondary | ICD-10-CM

## 2023-03-28 NOTE — Progress Notes (Signed)
Telephone nursing appointment for review of most recent CT-Chest results. I verified patient's identity x2 and began nursing interview.   Patient reports recovering well from recent A-Fib, which is now under control. Patient denies any other related issues at this time.   Meaningful use complete.   Patient aware of their 3:30pm-03/28/23 telephone appointment w/ Laurence Aly PA-C. I left my extension [phone] in case patient needs anything. Patient verbalized understanding. This concludes the nursing interview.   Patient contact [phone]     Ruel Favors, LPN

## 2023-03-29 ENCOUNTER — Other Ambulatory Visit: Payer: Self-pay | Admitting: Radiation Oncology

## 2023-03-29 DIAGNOSIS — E8779 Other fluid overload: Secondary | ICD-10-CM | POA: Diagnosis not present

## 2023-03-29 DIAGNOSIS — J90 Pleural effusion, not elsewhere classified: Secondary | ICD-10-CM | POA: Diagnosis not present

## 2023-03-29 DIAGNOSIS — C3431 Malignant neoplasm of lower lobe, right bronchus or lung: Secondary | ICD-10-CM

## 2023-03-29 DIAGNOSIS — I480 Paroxysmal atrial fibrillation: Secondary | ICD-10-CM | POA: Diagnosis not present

## 2023-03-29 DIAGNOSIS — I129 Hypertensive chronic kidney disease with stage 1 through stage 4 chronic kidney disease, or unspecified chronic kidney disease: Secondary | ICD-10-CM | POA: Diagnosis not present

## 2023-03-29 DIAGNOSIS — C3490 Malignant neoplasm of unspecified part of unspecified bronchus or lung: Secondary | ICD-10-CM | POA: Diagnosis not present

## 2023-03-29 DIAGNOSIS — E876 Hypokalemia: Secondary | ICD-10-CM | POA: Diagnosis not present

## 2023-03-29 NOTE — Progress Notes (Signed)
Radiation Oncology         ([phone]) [phone] ________________________________  Outpatient Follow Up - Conducted via telephone at patient request.  I spoke with the patient to conduct this consult visit via telephone. The patient was notified in advance and was offered an in person or telemedicine meeting to allow for face to face communication but instead preferred to proceed with a telephone visit. ________________________________    Name: [name].        MRN: [id number]   Date of Service:03/29/23  DOB: August 25, 1924  MV:HQIONG, Donald Leriche, MD  [name], *     REFERRING PHYSICIAN: Loreli Ramsey, *  DIAGNOSIS: The primary encounter diagnosis was Nodule of lower lobe of right lung. A diagnosis of Malignant neoplasm of bronchus of right lower lobe [facility]) was also pertinent to this visit.  HISTORY OF PRESENT ILLNESS: [name]. is a 89 y.o.  male with a history of putative stage I lung cancer of the right lower lobe. The patient has a longstanding history of smoking,and is a lifelong smoker.  A chest x-ray at [facility] after a flare of asthma  revealed a nodule in the lung.  He proceeded with CT imaging on 10/23/2019 which revealed a 1.1 x 1.4 x 2.8 cm nodule that was spiculated in the right lower lobe, a PET scan on 11/23/2019 revealed an SUV of 3.2 and the nodule in the right lower lobe measured 1.6 x 1.4 cm, blood pool activity was 2.5.  There was also focal hypermetabolic activity in the right infrahilar region with an SUV max of 4.2 however there was no CT correlate for this.  He was counseled on options of proceeding with biopsy but given his age and comorbidities declined, he did have a repeat CT scan with super D component on 02/19/2020 redemonstrated the lobulated pulmonary nodule in the right lower lobe which abuts the major fissure measuring 1.4 x 1.3 cm. While it was not significantly changed but there was more hypodensity of the internal appearance of  the nodule. He was not interested in surgical resection and rather proceeded with Stereotactic Body Radiotherapy (SBRT). He completed this in February 2022.   Since his treatment, he has been NED with stable emphysematous and atherosclerotic changes. On 12/15/22 he had new changes of clustered irregular nodular opacity in the right middle lobe. This was followed with short interval scan and on 03/22/23 showed stable post treatment changes in the RLL, and stable nodules in the lung that have been noted since 2022, however there was focal consolidation of the RML along the major fissure, possible fluid but overall indeterminate again. He also had right greater than left pleural effusions which were new. There was associated atelectasis at the lung bases, evidence of atherosclerotic and coronary calcifications. His daughter is contacted to review these results.    PREVIOUS RADIATION THERAPY:   03/25/2020 through 04/01/2020  SBRT Treatment Site Technique Total Dose (Gy) Dose per Fx (Gy) Completed Fx Beam Energies  Lung, Right: Lung_RLL IMRT 54/54 18 3/3 6XFFF      PAST MEDICAL HISTORY:  Past Medical History:  Diagnosis Date   Anemia    Arthritis    Cataract    Complication following bilateral mastoidectomy    Dysphagia    with large pills   Gout    HLD (hyperlipidemia)    HTN (hypertension)    Lung nodule 02/19/2020   Melanoma (HCC) 2009   Left forehead   Right  posterior capsular opacification 06/21/2019   Wrist fracture    left    PAST SURGICAL HISTORY: Past Surgical History:  Procedure Laterality Date   cataracts     MASTOIDECTOMY REVISION Bilateral years ago   ORIF WRIST FRACTURE Left 1975   QUADRICEPS TENDON REPAIR Right 08/23/2018   Procedure: REPAIR QUADRICEP TENDON;  Surgeon: Ollen Gross, MD;  Location: [facility];  Service: Orthopedics;  Laterality: Right;    TOTAL HIP ARTHROPLASTY Left 05/05/2016   Procedure: LEFT TOTAL HIP ARTHROPLASTY ANTERIOR APPROACH;  Surgeon: Ollen Gross, MD;  Location: [facility];  Service: Orthopedics;  Laterality: Left;    PAST SOCIAL HISTORY:  Social History   Socioeconomic History   Marital status: Widowed    Spouse name: Not on file   Number of children: 2   Years of education: Not on file   Highest education level: Not on file  Occupational History   Occupation: retired  Tobacco Use   Smoking status: Never   Smokeless tobacco: Never  Vaping Use   Vaping status: Never Used  Substance and Sexual Activity   Alcohol use: No   Drug use: No   Sexual activity: Not on file  Other Topics Concern   Not on file  Social History Narrative   Not on file   Social Drivers of Health   Financial Resource Strain: Not on file  Food Insecurity: No Food Insecurity (03/28/2023)   Hunger Vital Sign    Worried About Running Out of Food in the Last Year: Never true    Ran Out of Food in the Last Year: Never true  Transportation Needs: No Transportation Needs (03/28/2023)   PRAPARE - Administrator, Civil Service (Medical): No    Lack of Transportation (Non-Medical): No  Physical Activity: Not on file  Stress: Not on file  Social Connections: Not on file  Intimate Partner Violence: Not At Risk (03/28/2023)   Humiliation, Afraid, Rape, and Kick questionnaire    Fear of Current or Ex-Partner: No    Emotionally Abused: No    Physically Abused: No    Sexually Abused: No    PAST FAMILY HISTORY: Family History  Problem Relation Age of Onset   Pneumonia Mother    Heart attack Father    Breast cancer Sister    Heart attack Nephew 66   Heart failure Sister    Kidney Stones Daughter    Esophageal cancer Neg Hx    Colon cancer Neg Hx    Rectal cancer Neg Hx    Stomach cancer Neg Hx     MEDICATIONS  Current Outpatient Medications  Medication Sig Dispense Refill   acetaminophen (TYLENOL) 325 MG tablet Take 2 tablets (650 mg total) by mouth every 6 (six) hours as needed for mild pain (pain score 1-3) (or Fever >/= 101).      apixaban (ELIQUIS) 5 MG TABS tablet Take 1 tablet (5 mg total) by mouth 2 (two) times daily. 180 tablet 3   calcium carbonate (TUMS - DOSED IN MG ELEMENTAL CALCIUM) 500 MG chewable tablet Chew 1-2 tablets by mouth as needed for indigestion or heartburn.     Calcium-Vitamin D-Vitamin K (VIACTIV CALCIUM PLUS D) 650-12.5-40 MG-MCG-MCG CHEW Chew 1 each by mouth daily.     Camphor-Eucalyptus-Menthol (CHEST RUB) 4.8-1.2-2.6 % OINT Apply 1 application  topically See admin instructions. Vick's vaporub- Apply to the chest nightly, then cover     Cholecalciferol (VITAMIN D3) 50 MCG (2000 UT) TABS Take 2,000 Units  by mouth daily.     Cyanocobalamin (B-12) 2500 MCG TABS Take 2,500 mcg by mouth daily.     fexofenadine (ALLEGRA ALLERGY) 180 MG tablet Take 1 tablet (180 mg total) by mouth daily. 30 tablet 6   finasteride (PROSCAR) 5 MG tablet Take 5 mg by mouth daily.     fluticasone (FLONASE) 50 MCG/ACT nasal spray INSTILL 1 SPRAY INTO EACH NOSTRIL ONCE DAILY AS DIRECTED (Patient taking differently: Place 1 spray into both nostrils in the morning.) 16 mL 2   fluticasone-salmeterol (ADVAIR) 100-50 MCG/ACT AEPB Inhale 1 puff into the lungs in the morning.     ipratropium (ATROVENT) 0.03 % nasal spray INSTILL 2 SPRAYS INTO EACH NOSTRIL EVERY 12 HOURS AS DIRECTED (Patient taking differently: Place 2 sprays into both nostrils every 12 (twelve) hours.) 30 mL 12   metoprolol tartrate (LOPRESSOR) 25 MG tablet Take 1 tablet (25 mg total) by mouth 2 (two) times daily. 180 tablet 3   NON FORMULARY Take 1 tablet by mouth See admin instructions. Vision Vite - Take 1 tablet by mouth with breakfast     polyethylene glycol (MIRALAX / GLYCOLAX) packet Take 17 g by mouth daily as needed for mild constipation (mix as directed).     rosuvastatin (CRESTOR) 20 MG tablet Take 20 mg by mouth daily.     Wheat Dextrin (BENEFIBER) POWD Take 4 g by mouth See admin instructions. Mix 4 grams (1 teaspoonful) of powder into 4-8 ounces of  water and drink once a day     No current facility-administered medications for this encounter.    ALLERGIES:  Allergies  Allergen Reactions   Other Other (See Comments)    Pt cannot take gel caps - med gets stuck in throat        REVIEW OF SYSTEMS: On review of systems, the patient's daughter states he continues to have post nasal drip and phlegm which has been stable for at least two years. No new shortness of breath or cough is noted, and no fevers or chills or recent infections. He recently had an episode of atrial fibrillation and his daughter states he's had a few BP readings with the systolic values in the 140 range. She is curious if this is from his eliquis. No other complaints are verbalized.        PHYSICAL EXAM:  Unable to assess due to encounter type.    ECOG = 1 0 - Asymptomatic (Fully active, able to carry on all predisease activities without restriction) 1 - Symptomatic but completely ambulatory (Restricted in physically strenuous activity but ambulatory and able to carry out work of a light or sedentary nature. For example, light housework, office work) 2 - Symptomatic, <50% in bed during the day (Ambulatory and capable of all self care but unable to carry out any work activities. Up and about more than 50% of waking hours) 3 - Symptomatic, >50% in bed, but not bedbound (Capable of only limited self-care, confined to bed or chair 50% or more of waking hours) 4 - Bedbound (Completely disabled. Cannot carry on any self-care. Totally confined to bed or chair) 5 - Death   Santiago Glad MM, Creech RH, Tormey DC, et al. [phone]). "Toxicity and response criteria of the [facility]". Am. Evlyn Clines. Oncol. 5 (6): 649-55      IMPRESSION/PLAN: 1.         Putative Stage IA2, cT1bN0M0 NSCLC of the RLL. The patient is doing fairly well per his The patient's imaging is  stable given the type of treatment he's previously received. We will follow this closely. We will  repeat his next scan in 3 months time to follow up on the RML nodular change, but anticipate if stable to improved, we would go back to 6 month intervals for follow up since he continues to be a very active man for his age. 2. RML nodule. This area remains indeterminate and we will again repeat a short interval scan in 3 months. 3. Elevated BP at home and pleural effusions. The patient is asymptomatic, but will be seeing his PCP tomorrow and just saw his cardiology team. I will reach out to them so they're aware of these findings and can make recommendations.  This encounter was conducted via telephone.  The patient has provided two factor identification and has given verbal consent for this type of encounter and has been advised to only accept a meeting of this type in a secure network environment. The time spent during this encounter was 35 minutes including preparation, discussion, and coordination of the patient's care. The attendants for this meeting include   Ronny Bacon  and his daughter Peggye Form During the encounter,  Ronny Bacon was located remotely. [name]. was located at home as was his daughter Peggye Form.     Osker Mason, PAC

## 2023-04-12 DIAGNOSIS — E8779 Other fluid overload: Secondary | ICD-10-CM | POA: Diagnosis not present

## 2023-04-12 DIAGNOSIS — I480 Paroxysmal atrial fibrillation: Secondary | ICD-10-CM | POA: Diagnosis not present

## 2023-04-12 DIAGNOSIS — I129 Hypertensive chronic kidney disease with stage 1 through stage 4 chronic kidney disease, or unspecified chronic kidney disease: Secondary | ICD-10-CM | POA: Diagnosis not present

## 2023-04-12 DIAGNOSIS — J90 Pleural effusion, not elsewhere classified: Secondary | ICD-10-CM | POA: Diagnosis not present

## 2023-04-12 DIAGNOSIS — E876 Hypokalemia: Secondary | ICD-10-CM | POA: Diagnosis not present

## 2023-04-12 DIAGNOSIS — C3490 Malignant neoplasm of unspecified part of unspecified bronchus or lung: Secondary | ICD-10-CM | POA: Diagnosis not present

## 2023-05-17 DIAGNOSIS — N401 Enlarged prostate with lower urinary tract symptoms: Secondary | ICD-10-CM | POA: Diagnosis not present

## 2023-05-17 DIAGNOSIS — R7301 Impaired fasting glucose: Secondary | ICD-10-CM | POA: Diagnosis not present

## 2023-05-17 DIAGNOSIS — D649 Anemia, unspecified: Secondary | ICD-10-CM | POA: Diagnosis not present

## 2023-05-17 DIAGNOSIS — I129 Hypertensive chronic kidney disease with stage 1 through stage 4 chronic kidney disease, or unspecified chronic kidney disease: Secondary | ICD-10-CM | POA: Diagnosis not present

## 2023-05-17 DIAGNOSIS — R946 Abnormal results of thyroid function studies: Secondary | ICD-10-CM | POA: Diagnosis not present

## 2023-05-17 DIAGNOSIS — N1831 Chronic kidney disease, stage 3a: Secondary | ICD-10-CM | POA: Diagnosis not present

## 2023-05-17 DIAGNOSIS — E785 Hyperlipidemia, unspecified: Secondary | ICD-10-CM | POA: Diagnosis not present

## 2023-05-19 ENCOUNTER — Telehealth: Payer: Self-pay

## 2023-05-19 ENCOUNTER — Encounter: Payer: Self-pay | Admitting: Pulmonary Disease

## 2023-05-19 ENCOUNTER — Ambulatory Visit: Payer: Medicare Other | Admitting: Pulmonary Disease

## 2023-05-19 VITALS — BP 166/77 | HR 54 | Ht 67.0 in | Wt 136.6 lb

## 2023-05-19 DIAGNOSIS — R0981 Nasal congestion: Secondary | ICD-10-CM

## 2023-05-19 DIAGNOSIS — J4520 Mild intermittent asthma, uncomplicated: Secondary | ICD-10-CM | POA: Diagnosis not present

## 2023-05-19 DIAGNOSIS — R0982 Postnasal drip: Secondary | ICD-10-CM

## 2023-05-19 MED ORDER — IPRATROPIUM BROMIDE 0.03 % NA SOLN: 2.0000 | Freq: Two times a day (BID) | NASAL | 12 refills | Status: AC

## 2023-05-19 MED ORDER — FLUTICASONE PROPIONATE 50 MCG/ACT NA SUSP: 1.0000 | Freq: Every day | NASAL | 2 refills | Status: AC

## 2023-05-19 MED ORDER — FEXOFENADINE HCL 180 MG PO TABS: 180.0000 mg | ORAL_TABLET | Freq: Every day | ORAL | 6 refills | Status: AC

## 2023-05-19 MED ORDER — FLUTICASONE-SALMETEROL 100-50 MCG/ACT IN AEPB: 1.0000 | INHALATION_SPRAY | Freq: Two times a day (BID) | RESPIRATORY_TRACT | 11 refills | Status: AC

## 2023-05-19 NOTE — Progress Notes (Signed)
 Synopsis: Referred in July 2023 for Asthma/COPD by [name], MD  Subjective:   PATIENT ID: [name]. GENDER: male DOB: 06/08/1924, MRN: [id number]  HPI  Chief Complaint  Patient presents with   Follow-up    Pt states  she been good other then high BP   [name] is a 89 year old male, never smoker with hypertension, hyperlipidemia, melanoma and RLL stage I lung cancer s/p SBRT 2022 who returns to pulmonary clinic asthma.  He experiences persistent phlegm and mucus in his throat, which he describes as being present all the time. He uses Advair one puff every morning, which he finds effective for his breathing. He does not take any allergy pills but uses nasal sprays that have been helpful. He recalls being on Allegra and reflux medication in the past for congestion. No new breathing issues, and his breathing is not problematic aside from the mucus. No significant sinus issues and no changes in his breathing pattern. He has not used Advair at night, only in the mornings.  Approximately four to five weeks ago, he experienced a fall on the sidewalk while going [place] the [place], resulting in soreness in his arm. He did not seek medical evaluation as he maintained full range of motion and did not experience any popping or cracking sounds. He has since been taking it easy and allowing it to recover on its own. His daughter now insists he carries a cane since his fall.  His blood pressure was high this morning at 166/77, although it usually averages around 140. He notes that his blood pressure can vary, sometimes being high and sometimes low.  He remains active, working outside and walking regularly, although he limits outdoor activity during cold weather to avoid pneumonia. He walks indoors during winter and has resumed outdoor activities with the arrival of good weather.  OV 07/02/22 He has noticed decrease in congestion/mucous with fluticasone/ipratropium nasal sprays and  fexofenadine allergy medicine. He is using advair diskus 100-72mcg 1 puff twice daily.   He does have intermittent reflux.   OV 10/27/21 He has been using advair diskus 100-94mcg 1 puff twice daily. He was started on fluticasone and ipratropium nasal sprays at last visit. He reports his wheezing and cough are much better. He continues to have some cough with mucous congestion in his throat.   Initial OV 09/23/21 He reports increased wheezing and cough with mucous production in the morning times over the past year. He has constant sinus congestion and post-nasal drainage where he feels like he always has phlegm in his throat. He denies issues with dyspnea as he is able to complete all of his house work. He does have mild reflux disease that is managed with as needed TUMS. He denies seasonal allergies. He denies issues with his breathing related to weather changes. He reports childhood asthma. He may wake up occasionally coughing up mucous.  He reports using either symbicort or advair diskus inhaler for his morning symptoms which helps.   He is retired and widowed. 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    Past Medical History:  Diagnosis Date   Anemia    Arthritis    Cataract    Complication following bilateral mastoidectomy    Dysphagia    with large pills   Gout    HLD (hyperlipidemia)    HTN (hypertension)    Lung nodule 02/19/2020   Melanoma (HCC) 2009   Left forehead  Right posterior capsular opacification 06/21/2019   Wrist fracture    left     Family History  Problem Relation Age of Onset   Pneumonia Mother    Heart attack Father    Breast cancer Sister    Heart attack Nephew 66   Heart failure Sister    Kidney Stones Daughter    Esophageal cancer Neg Hx    Colon cancer Neg Hx    Rectal cancer Neg Hx    Stomach cancer Neg Hx      Social History   Socioeconomic History   Marital status: Widowed    Spouse name: Not on file   Number of  children: 2   Years of education: Not on file   Highest education level: Not on file  Occupational History   Occupation: retired  Tobacco Use   Smoking status: Never   Smokeless tobacco: Never  Vaping Use   Vaping status: Never Used  Substance and Sexual Activity   Alcohol use: No   Drug use: No   Sexual activity: Not on file  Other Topics Concern   Not on file  Social History Narrative   Not on file   Social Drivers of Health   Financial Resource Strain: Not on file  Food Insecurity: No Food Insecurity (03/28/2023)   Hunger Vital Sign    Worried About Running Out of Food in the Last Year: Never true    Ran Out of Food in the Last Year: Never true  Transportation Needs: No Transportation Needs (03/28/2023)   PRAPARE - Administrator, Civil Service (Medical): No    Lack of Transportation (Non-Medical): No  Physical Activity: Not on file  Stress: Not on file  Social Connections: Not on file  Intimate Partner Violence: Not At Risk (03/28/2023)   Humiliation, Afraid, Rape, and Kick questionnaire    Fear of Current or Ex-Partner: No    Emotionally Abused: No    Physically Abused: No    Sexually Abused: No     Allergies  Allergen Reactions   Other Other (See Comments)    Pt cannot take gel caps - med gets stuck in throat      Outpatient Medications Prior to Visit  Medication Sig Dispense Refill   acetaminophen (TYLENOL) 325 MG tablet Take 2 tablets (650 mg total) by mouth every 6 (six) hours as needed for mild pain (pain score 1-3) (or Fever >/= 101).     apixaban (ELIQUIS) 5 MG TABS tablet Take 1 tablet (5 mg total) by mouth 2 (two) times daily. 180 tablet 3   calcium carbonate (TUMS - DOSED IN MG ELEMENTAL CALCIUM) 500 MG chewable tablet Chew 1-2 tablets by mouth as needed for indigestion or heartburn.     Calcium-Vitamin D-Vitamin K (VIACTIV CALCIUM PLUS D) 650-12.5-40 MG-MCG-MCG CHEW Chew 1 each by mouth daily.     Camphor-Eucalyptus-Menthol (CHEST RUB)  4.8-1.2-2.6 % OINT Apply 1 application  topically See admin instructions. Vick's vaporub- Apply to the chest nightly, then cover     Cholecalciferol (VITAMIN D3) 50 MCG (2000 UT) TABS Take 2,000 Units by mouth daily.     Cyanocobalamin (B-12) 2500 MCG TABS Take 2,500 mcg by mouth daily.     finasteride (PROSCAR) 5 MG tablet Take 5 mg by mouth daily.     metoprolol tartrate (LOPRESSOR) 25 MG tablet Take 1 tablet (25 mg total) by mouth 2 (two) times daily. 180 tablet 3   NON FORMULARY Take 1 tablet by mouth  See admin instructions. Vision Vite - Take 1 tablet by mouth with breakfast     polyethylene glycol (MIRALAX / GLYCOLAX) packet Take 17 g by mouth daily as needed for mild constipation (mix as directed).     rosuvastatin (CRESTOR) 20 MG tablet Take 20 mg by mouth daily.     Wheat Dextrin (BENEFIBER) POWD Take 4 g by mouth See admin instructions. Mix 4 grams (1 teaspoonful) of powder into 4-8 ounces of water and drink once a day     fexofenadine (ALLEGRA ALLERGY) 180 MG tablet Take 1 tablet (180 mg total) by mouth daily. 30 tablet 6   fluticasone (FLONASE) 50 MCG/ACT nasal spray INSTILL 1 SPRAY INTO EACH NOSTRIL ONCE DAILY AS DIRECTED (Patient taking differently: Place 1 spray into both nostrils in the morning.) 16 mL 2   fluticasone-salmeterol (ADVAIR) 100-50 MCG/ACT AEPB Inhale 1 puff into the lungs in the morning.     ipratropium (ATROVENT) 0.03 % nasal spray INSTILL 2 SPRAYS INTO EACH NOSTRIL EVERY 12 HOURS AS DIRECTED (Patient taking differently: Place 2 sprays into both nostrils every 12 (twelve) hours.) 30 mL 12   No facility-administered medications prior to visit.   Review of Systems  Constitutional:  Negative for chills, fever, malaise/fatigue and weight loss.  HENT:  Negative for congestion, sinus pain and sore throat.   Eyes: Negative.   Respiratory:  Positive for cough and sputum production. Negative for hemoptysis, shortness of breath and wheezing.   Cardiovascular:  Negative for  chest pain, palpitations, orthopnea, claudication and leg swelling.  Gastrointestinal:  Negative for abdominal pain, heartburn, nausea and vomiting.  Genitourinary: Negative.   Skin:  Negative for rash.  Neurological:  Negative for weakness.   Objective:   Vitals:   05/19/23 0941  BP: (!) 166/77  Pulse: (!) 54  SpO2: 98%  Weight: 136 lb 9.6 oz (62 kg)  Height: 5\' 7"  (1.702 m)   Physical Exam Constitutional:      General: He is not in acute distress. HENT:     Head: Normocephalic and atraumatic.  Eyes:     Conjunctiva/sclera: Conjunctivae normal.  Cardiovascular:     Rate and Rhythm: Normal rate and regular rhythm.     Pulses: Normal pulses.     Heart sounds: Normal heart sounds. No murmur heard. Pulmonary:     Effort: Pulmonary effort is normal.     Breath sounds: Normal breath sounds. No wheezing or rhonchi.  Musculoskeletal:     Right lower leg: No edema.     Left lower leg: No edema.  Skin:    General: Skin is warm and dry.  Neurological:     General: No focal deficit present.     Mental Status: He is alert.    CBC    Component Value Date/Time   WBC 7.0 02/01/2023 1439   WBC 5.7 01/22/2023 0539   RBC 3.13 (L) 02/01/2023 1439   RBC 3.08 (L) 01/22/2023 0539   HGB 10.1 (L) 02/01/2023 1439   HGB 11.9 (L) 09/29/2016 0801   HCT 30.8 (L) 02/01/2023 1439   HCT 35.5 (L) 09/29/2016 0801   PLT 276 02/01/2023 1439   MCV 98 (H) 02/01/2023 1439   MCV 96.2 09/29/2016 0801   MCH 32.3 02/01/2023 1439   MCH 32.5 01/22/2023 0539   MCHC 32.8 02/01/2023 1439   MCHC 32.5 01/22/2023 0539   RDW 13.2 02/01/2023 1439   RDW 13.4 09/29/2016 0801   LYMPHSABS 0.8 01/20/2023 1347   LYMPHSABS 1.2 09/29/2016 0801  MONOABS 0.4 01/20/2023 1347   MONOABS 0.3 09/29/2016 0801   EOSABS 0.0 01/20/2023 1347   EOSABS 0.3 09/29/2016 0801   BASOSABS 0.0 01/20/2023 1347   BASOSABS 0.0 09/29/2016 0801      Latest Ref Rng & Units 02/01/2023    2:39 PM 01/22/2023    5:39 AM 01/21/2023     1:40 AM  BMP  Glucose 70 - 99 mg/dL 409  811  914   BUN 10 - 36 mg/dL 20  34  28   Creatinine 0.76 - 1.27 mg/dL 7.82  9.56  2.13   BUN/Creat Ratio 10 - 24 18     Sodium 134 - 144 mmol/L 141  137  138   Potassium 3.5 - 5.2 mmol/L 4.9  3.9  3.3   Chloride 96 - 106 mmol/L 104  105  101   CO2 20 - 29 mmol/L 26  25  28    Calcium 8.6 - 10.2 mg/dL 9.7  9.0  9.5    Chest imaging: CT Chest w contrast 05/15/21 1. Treated right lower lobe pulmonary nodule is grossly stable compared to the prior study with surrounding postradiation changes, as above. No signs of new metastatic disease are noted in the thorax. 2. Mild diffuse bronchial wall thickening with very mild centrilobular and paraseptal emphysema; imaging findings suggestive of underlying COPD. 3. Aortic atherosclerosis, in addition to left main and three-vessel coronary artery disease. 4. There are calcifications of the aortic valve and mitral annulus. Echocardiographic correlation for evaluation of potential valvular dysfunction may be warranted if clinically indicated. 5. Mild cardiomegaly.  PFT:     No data to display          Labs:  Path:  Echo:  Heart Catheterization:  Assessment & Plan:   Mild intermittent asthma without complication - Plan: fluticasone-salmeterol (ADVAIR) 100-50 MCG/ACT AEPB  Sinus congestion - Plan: fluticasone (FLONASE) 50 MCG/ACT nasal spray, ipratropium (ATROVENT) 0.03 % nasal spray, fexofenadine (ALLEGRA ALLERGY) 180 MG tablet  Post-nasal drip - Plan: fluticasone (FLONASE) 50 MCG/ACT nasal spray, ipratropium (ATROVENT) 0.03 % nasal spray, fexofenadine (ALLEGRA ALLERGY) 180 MG tablet  Discussion: Ark Agrusa is a 89 year old male, never smoker with hypertension, hyperlipidemia, melanoma and RLL stage I lung cancer s/p SBRT 2022 who returns to pulmonary clinic asthma.  Asthma Breathing well-managed with Advair. Persistent mucus likely due to sinus issues or reflux. Active lifestyle  maintained. - Continue Advair one puff in the morning. Consider evening dose if needed. - Prescribe Allegra (fexofenadine) for mucus management. - Continue nasal sprays for sinus relief.  Hypertension Blood pressure elevated at 166/77, usually averages around 140, acceptable for age. - continue to monitor  Fall with arm injury Fall 4-5 weeks ago with arm soreness. Full range of motion retained, no alarming symptoms. - shoulder soreness appears to be improving   Follow-up Advised to return for follow-up in one year unless changes in breathing occur. - Schedule follow-up appointment in one year. - Advise to contact the office if there are changes in breathing.   Melody Comas, MD [facility] Office: [phone]   Current Outpatient Medications:    acetaminophen (TYLENOL) 325 MG tablet, Take 2 tablets (650 mg total) by mouth every 6 (six) hours as needed for mild pain (pain score 1-3) (or Fever >/= 101)., Disp: , Rfl:    apixaban (ELIQUIS) 5 MG TABS tablet, Take 1 tablet (5 mg total) by mouth 2 (two) times daily., Disp: 180 tablet, Rfl: 3  calcium carbonate (TUMS - DOSED IN MG ELEMENTAL CALCIUM) 500 MG chewable tablet, Chew 1-2 tablets by mouth as needed for indigestion or heartburn., Disp: , Rfl:    Calcium-Vitamin D-Vitamin K (VIACTIV CALCIUM PLUS D) 650-12.5-40 MG-MCG-MCG CHEW, Chew 1 each by mouth daily., Disp: , Rfl:    Camphor-Eucalyptus-Menthol (CHEST RUB) 4.8-1.2-2.6 % OINT, Apply 1 application  topically See admin instructions. Vick's vaporub- Apply to the chest nightly, then cover, Disp: , Rfl:    Cholecalciferol (VITAMIN D3) 50 MCG (2000 UT) TABS, Take 2,000 Units by mouth daily., Disp: , Rfl:    Cyanocobalamin (B-12) 2500 MCG TABS, Take 2,500 mcg by mouth daily., Disp: , Rfl:    finasteride (PROSCAR) 5 MG tablet, Take 5 mg by mouth daily., Disp: , Rfl:    metoprolol tartrate (LOPRESSOR) 25 MG tablet, Take 1 tablet (25 mg total) by mouth 2 (two)  times daily., Disp: 180 tablet, Rfl: 3   NON FORMULARY, Take 1 tablet by mouth See admin instructions. Vision Vite - Take 1 tablet by mouth with breakfast, Disp: , Rfl:    polyethylene glycol (MIRALAX / GLYCOLAX) packet, Take 17 g by mouth daily as needed for mild constipation (mix as directed)., Disp: , Rfl:    rosuvastatin (CRESTOR) 20 MG tablet, Take 20 mg by mouth daily., Disp: , Rfl:    Wheat Dextrin (BENEFIBER) POWD, Take 4 g by mouth See admin instructions. Mix 4 grams (1 teaspoonful) of powder into 4-8 ounces of water and drink once a day, Disp: , Rfl:    fexofenadine (ALLEGRA ALLERGY) 180 MG tablet, Take 1 tablet (180 mg total) by mouth daily., Disp: 30 tablet, Rfl: 6   fluticasone (FLONASE) 50 MCG/ACT nasal spray, Place 1 spray into both nostrils daily. INSTILL 1 SPRAY INTO EACH NOSTRIL ONCE DAILY AS DIRECTED, Disp: 16 mL, Rfl: 2   fluticasone-salmeterol (ADVAIR) 100-50 MCG/ACT AEPB, Inhale 1 puff into the lungs 2 (two) times daily., Disp: 60 each, Rfl: 11   ipratropium (ATROVENT) 0.03 % nasal spray, Place 2 sprays into both nostrils 2 (two) times daily. INSTILL 2 SPRAYS INTO EACH NOSTRIL EVERY 12 HOURS AS DIRECTED, Disp: 30 mL, Rfl: 12

## 2023-05-19 NOTE — Patient Instructions (Addendum)
 Continue advair 1 puff daily, may use a second puff in the evening if needed - rinse mouth out after each use  Continue flonase nasal spray and ipratropium nasal spray  Start fexofenadine (Allegra) 180mg  daily  Follow up in 1 year, call sooner if needed

## 2023-05-19 NOTE — Telephone Encounter (Signed)
 Left message for either a return call or a Fax (back fax ) with what would be coved.

## 2023-05-26 ENCOUNTER — Telehealth: Payer: Self-pay | Admitting: *Deleted

## 2023-05-26 DIAGNOSIS — I480 Paroxysmal atrial fibrillation: Secondary | ICD-10-CM | POA: Diagnosis not present

## 2023-05-26 DIAGNOSIS — J309 Allergic rhinitis, unspecified: Secondary | ICD-10-CM | POA: Diagnosis not present

## 2023-05-26 DIAGNOSIS — N1831 Chronic kidney disease, stage 3a: Secondary | ICD-10-CM | POA: Diagnosis not present

## 2023-05-26 DIAGNOSIS — J45909 Unspecified asthma, uncomplicated: Secondary | ICD-10-CM | POA: Diagnosis not present

## 2023-05-26 DIAGNOSIS — Z0000 Encounter for general adult medical examination without abnormal findings: Secondary | ICD-10-CM | POA: Diagnosis not present

## 2023-05-26 DIAGNOSIS — I10 Essential (primary) hypertension: Secondary | ICD-10-CM | POA: Diagnosis not present

## 2023-05-26 DIAGNOSIS — E785 Hyperlipidemia, unspecified: Secondary | ICD-10-CM | POA: Diagnosis not present

## 2023-05-26 DIAGNOSIS — C3490 Malignant neoplasm of unspecified part of unspecified bronchus or lung: Secondary | ICD-10-CM | POA: Diagnosis not present

## 2023-05-26 DIAGNOSIS — R7301 Impaired fasting glucose: Secondary | ICD-10-CM | POA: Diagnosis not present

## 2023-05-26 DIAGNOSIS — R413 Other amnesia: Secondary | ICD-10-CM | POA: Diagnosis not present

## 2023-05-26 DIAGNOSIS — M8580 Other specified disorders of bone density and structure, unspecified site: Secondary | ICD-10-CM | POA: Diagnosis not present

## 2023-05-26 DIAGNOSIS — I129 Hypertensive chronic kidney disease with stage 1 through stage 4 chronic kidney disease, or unspecified chronic kidney disease: Secondary | ICD-10-CM | POA: Diagnosis not present

## 2023-05-26 DIAGNOSIS — R82998 Other abnormal findings in urine: Secondary | ICD-10-CM | POA: Diagnosis not present

## 2023-05-26 DIAGNOSIS — I2510 Atherosclerotic heart disease of native coronary artery without angina pectoris: Secondary | ICD-10-CM | POA: Diagnosis not present

## 2023-05-26 NOTE — Telephone Encounter (Signed)
 CALLED PATIENT TO INFORM OF CT FOR 06-27-23- ARRIVAL TIME- 2:15 PM @ [facility], NO RESTRICTIONS TO SCAN, PATIENT TO RECEIVE RESULTS FROM ALISON PERKINS ON 07-04-23 @ 1:30 PM, VIA TELEPHONE, SPOKE WITH PATIENT'S DAUGHTER- NANCY AND SHE IS AWARE OF THESE APPTS. AND THE INSTRUCTIONS

## 2023-06-08 DIAGNOSIS — H6123 Impacted cerumen, bilateral: Secondary | ICD-10-CM | POA: Diagnosis not present

## 2023-06-16 DIAGNOSIS — Z9889 Other specified postprocedural states: Secondary | ICD-10-CM | POA: Diagnosis not present

## 2023-06-16 DIAGNOSIS — H47292 Other optic atrophy, left eye: Secondary | ICD-10-CM | POA: Diagnosis not present

## 2023-06-16 DIAGNOSIS — H401123 Primary open-angle glaucoma, left eye, severe stage: Secondary | ICD-10-CM | POA: Diagnosis not present

## 2023-06-16 DIAGNOSIS — H353112 Nonexudative age-related macular degeneration, right eye, intermediate dry stage: Secondary | ICD-10-CM | POA: Diagnosis not present

## 2023-06-16 DIAGNOSIS — H35373 Puckering of macula, bilateral: Secondary | ICD-10-CM | POA: Diagnosis not present

## 2023-06-27 ENCOUNTER — Ambulatory Visit (HOSPITAL_COMMUNITY)
Admission: RE | Admit: 2023-06-27 | Discharge: 2023-06-27 | Disposition: A | Discharge status: Home / Self Care | Source: Ambulatory Visit | Attending: Radiation Oncology | Admitting: Radiation Oncology

## 2023-06-27 DIAGNOSIS — C3431 Malignant neoplasm of lower lobe, right bronchus or lung: Secondary | ICD-10-CM | POA: Diagnosis not present

## 2023-06-27 DIAGNOSIS — C3490 Malignant neoplasm of unspecified part of unspecified bronchus or lung: Secondary | ICD-10-CM | POA: Diagnosis not present

## 2023-06-27 DIAGNOSIS — J432 Centrilobular emphysema: Secondary | ICD-10-CM | POA: Diagnosis not present

## 2023-06-27 DIAGNOSIS — I700 Atherosclerosis of aorta: Secondary | ICD-10-CM | POA: Diagnosis not present

## 2023-06-27 DIAGNOSIS — J90 Pleural effusion, not elsewhere classified: Secondary | ICD-10-CM | POA: Diagnosis not present

## 2023-06-27 LAB — POCT I-STAT CREATININE: Creatinine, Ser: 1.3 mg/dL — ABNORMAL HIGH (ref 0.61–1.24)

## 2023-06-27 MED ORDER — IOHEXOL 300 MG/ML  SOLN
100.0000 mL | Freq: Once | INTRAMUSCULAR | Status: AC | PRN
  Administered 2023-06-27: 100 mL via INTRAVENOUS

## 2023-07-04 ENCOUNTER — Encounter (HOSPITAL_BASED_OUTPATIENT_CLINIC_OR_DEPARTMENT_OTHER): Payer: Self-pay

## 2023-07-04 ENCOUNTER — Ambulatory Visit
Admission: RE | Admit: 2023-07-04 | Discharge: 2023-07-04 | Disposition: A | Discharge status: Home / Self Care | Source: Ambulatory Visit | Attending: Radiation Oncology | Admitting: Radiation Oncology

## 2023-07-04 ENCOUNTER — Encounter: Payer: Self-pay | Admitting: Radiation Oncology

## 2023-07-04 VITALS — Ht 67.0 in | Wt 130.0 lb

## 2023-07-04 DIAGNOSIS — C3431 Malignant neoplasm of lower lobe, right bronchus or lung: Secondary | ICD-10-CM | POA: Diagnosis not present

## 2023-07-04 NOTE — Progress Notes (Signed)
 Telephone nursing appointment for review of most recent CT-Chest results. I verified patient's identity x2 and began nursing interview.    Patient states issues as follows... -Pain: Denies -Fatigue: Denies -Chest: Denies -Lungs: Denies -Cardiac: Denies -Skin: Denis -Appetite: Good   Patient denies any other related issues at this time.   Meaningful use complete.   Patient aware of their telephone appointment w/ Allana Ishikawa PA-C. I left my extension [phone] in case patient needs anything. Patient verbalized understanding. This concludes the nursing interview.   Patient preferred phone # [phone]    Avery Bodo, LPN

## 2023-07-04 NOTE — Progress Notes (Signed)
 Radiation Oncology         [phone] ________________________________  Outpatient Follow Up - Conducted via telephone at patient request.  I spoke with the patient to conduct this consult visit via telephone. The patient was notified in advance and was offered an in person or telemedicine meeting to allow for face to face communication but instead preferred to proceed with a telephone visit. ________________________________    Name: [name].        MRN: [id number]   Date of Service:07/04/23  DOB: 14-Jun-1924  WU:[id number], Donald Powers, MD  [name], *     REFERRING PHYSICIAN: Zelphia Ramsey, *  DIAGNOSIS: The primary encounter diagnosis was Nodule of lower lobe of right lung. A diagnosis of Malignant neoplasm of bronchus of right lower lobe [facility]) was also pertinent to this visit.  HISTORY OF PRESENT ILLNESS: [name]. is a 89 y.o.  male with a history of putative stage I lung cancer of the right lower lobe. The patient has a longstanding history of smoking,and is a lifelong smoker.  A chest x-ray at [facility] after a flare of asthma  revealed a nodule in the lung.  He proceeded with CT imaging on 10/23/2019 which revealed a 1.1 x 1.4 x 2.8 cm nodule that was spiculated in the right lower lobe, a PET scan on 11/23/2019 revealed an SUV of 3.2 and the nodule in the right lower lobe measured 1.6 x 1.4 cm, blood pool activity was 2.5.  There was also focal hypermetabolic activity in the right infrahilar region with an SUV max of 4.2 however there was no CT correlate for this.  He was counseled on options of proceeding with biopsy but given his age and comorbidities declined, he did have a repeat CT scan with super D component on 02/19/2020 redemonstrated the lobulated pulmonary nodule in the right lower lobe which abuts the major fissure measuring 1.4 x 1.3 cm. While it was not significantly changed but there was more hypodensity of the internal appearance of  the nodule. He was not interested in surgical resection and rather proceeded with Stereotactic Body Radiotherapy (SBRT). He completed this in February 2022.   Since his treatment, he has been NED with stable emphysematous and atherosclerotic changes. We have followed a cluster of RML nodularity but this was slightly more prominent in January of 2025, and he had more notable effusions in the pleural space. We planned to repeat at a shorter interval and his scan on 06/27/23 showed post radiation changes consistent with prior treatment in the RLL region, and stable to improved nodular change in the RML, stable LUL nodule and decrease in the Right pleural effusion and resolution of the left pleural effusion. He continues to have stigmata of emphysema, and atherosclerotic disease of the aorta, and probable sludge and tiny stones in the gallbladder. He's contacted by phone today to review these results.  PREVIOUS RADIATION THERAPY:   03/25/2020 through 04/01/2020  SBRT Treatment Site Technique Total Dose (Gy) Dose per Fx (Gy) Completed Fx Beam Energies  Lung, Right: Lung_RLL IMRT 54/54 18 3/3 6XFFF      PAST MEDICAL HISTORY:  Past Medical History:  Diagnosis Date   Anemia    Arthritis    Cataract    Complication following bilateral mastoidectomy    Dysphagia    with large pills   Gout    HLD (hyperlipidemia)    HTN (hypertension)    Lung nodule 02/19/2020  Melanoma (HCC) 2009   Left forehead   Right posterior capsular opacification 06/21/2019   Wrist fracture    left    PAST SURGICAL HISTORY: Past Surgical History:  Procedure Laterality Date   cataracts     MASTOIDECTOMY REVISION Bilateral years ago   ORIF WRIST FRACTURE Left 1975   QUADRICEPS TENDON REPAIR Right 08/23/2018   Procedure: REPAIR QUADRICEP TENDON;  Surgeon: Liliane Rei, MD;  Location: [facility];  Service: Orthopedics;  Laterality: Right;    TOTAL HIP ARTHROPLASTY Left 05/05/2016   Procedure: LEFT TOTAL HIP  ARTHROPLASTY ANTERIOR APPROACH;  Surgeon: Liliane Rei, MD;  Location: [facility];  Service: Orthopedics;  Laterality: Left;    PAST SOCIAL HISTORY:  Social History   Socioeconomic History   Marital status: Widowed    Spouse name: Not on file   Number of children: 2   Years of education: Not on file   Highest education level: Not on file  Occupational History   Occupation: retired  Tobacco Use   Smoking status: Never   Smokeless tobacco: Never  Vaping Use   Vaping status: Never Used  Substance and Sexual Activity   Alcohol  use: No   Drug use: No   Sexual activity: Not on file  Other Topics Concern   Not on file  Social History Narrative   Not on file   Social Drivers of Health   Financial Resource Strain: Not on file  Food Insecurity: No Food Insecurity (07/04/2023)   Hunger Vital Sign    Worried About Running Out of Food in the Last Year: Never true    Ran Out of Food in the Last Year: Never true  Transportation Needs: No Transportation Needs (07/04/2023)   PRAPARE - Administrator, Civil Service (Medical): No    Lack of Transportation (Non-Medical): No  Physical Activity: Not on file  Stress: Not on file  Social Connections: Not on file  Intimate Partner Violence: Not At Risk (07/04/2023)   Humiliation, Afraid, Rape, and Kick questionnaire    Fear of Current or Ex-Partner: No    Emotionally Abused: No    Physically Abused: No    Sexually Abused: No    PAST FAMILY HISTORY: Family History  Problem Relation Age of Onset   Pneumonia Mother    Heart attack Father    Breast cancer Sister    Heart attack Nephew 66   Heart failure Sister    Kidney Stones Daughter    Esophageal cancer Neg Hx    Colon cancer Neg Hx    Rectal cancer Neg Hx    Stomach cancer Neg Hx     MEDICATIONS  Current Outpatient Medications  Medication Sig Dispense Refill   acetaminophen  (TYLENOL ) 325 MG tablet Take 2 tablets (650 mg total) by mouth every 6 (six) hours as needed for  mild pain (pain score 1-3) (or Fever >/= 101).     apixaban  (ELIQUIS ) 5 MG TABS tablet Take 1 tablet (5 mg total) by mouth 2 (two) times daily. 180 tablet 3   calcium  carbonate (TUMS - DOSED IN MG ELEMENTAL CALCIUM ) 500 MG chewable tablet Chew 1-2 tablets by mouth as needed for indigestion or heartburn.     Calcium -Vitamin D -Vitamin K  (VIACTIV CALCIUM  PLUS D) 650-12.5-40 MG-MCG-MCG CHEW Chew 1 each by mouth daily.     Camphor-Eucalyptus-Menthol  (CHEST RUB) 4.8-1.2-2.6 % OINT Apply 1 application  topically See admin instructions. Vick's vaporub- Apply to the chest nightly, then cover     Cholecalciferol  (  VITAMIN D3) 50 MCG (2000 UT) TABS Take 2,000 Units by mouth daily.     Cyanocobalamin  (B-12) 2500 MCG TABS Take 2,500 mcg by mouth daily.     fexofenadine  (ALLEGRA  ALLERGY) 180 MG tablet Take 1 tablet (180 mg total) by mouth daily. 30 tablet 6   finasteride  (PROSCAR ) 5 MG tablet Take 5 mg by mouth daily.     fluticasone  (FLONASE ) 50 MCG/ACT nasal spray Place 1 spray into both nostrils daily. INSTILL 1 SPRAY INTO EACH NOSTRIL ONCE DAILY AS DIRECTED 16 mL 2   fluticasone -salmeterol (ADVAIR) 100-50 MCG/ACT AEPB Inhale 1 puff into the lungs 2 (two) times daily. 60 each 11   ipratropium (ATROVENT ) 0.03 % nasal spray Place 2 sprays into both nostrils 2 (two) times daily. INSTILL 2 SPRAYS INTO EACH NOSTRIL EVERY 12 HOURS AS DIRECTED 30 mL 12   metoprolol  tartrate (LOPRESSOR ) 25 MG tablet Take 1 tablet (25 mg total) by mouth 2 (two) times daily. 180 tablet 3   NON FORMULARY Take 1 tablet by mouth See admin instructions. Vision Vite - Take 1 tablet by mouth with breakfast     polyethylene glycol (MIRALAX  / GLYCOLAX ) packet Take 17 g by mouth daily as needed for mild constipation (mix as directed).     rosuvastatin  (CRESTOR ) 20 MG tablet Take 20 mg by mouth daily.     Wheat Dextrin (BENEFIBER) POWD Take 4 g by mouth See admin instructions. Mix 4 grams (1 teaspoonful) of powder into 4-8 ounces of water  and drink  once a day     No current facility-administered medications for this encounter.    ALLERGIES:  Allergies  Allergen Reactions   Other Other (See Comments)    Pt cannot take gel caps - med gets stuck in throat        REVIEW OF SYSTEMS:  On review of systems, the patient continues to have phlegm when he coughs and postnasal drip which continue to be chronic and non progressive in nature. He has been doing well and feeling more energy as of late. He continues to have systolic BP readings of 140 range, but no chest pain or recent A Fib episodes that they're aware of. No other complaints are verbalized.        PHYSICAL EXAM:  Unable to assess due to encounter type.    ECOG = 1 0 - Asymptomatic (Fully active, able to carry on all predisease activities without restriction) 1 - Symptomatic but completely ambulatory (Restricted in physically strenuous activity but ambulatory and able to carry out work of a light or sedentary nature. For example, light housework, office work) 2 - Symptomatic, <50% in bed during the day (Ambulatory and capable of all self care but unable to carry out any work activities. Up and about more than 50% of waking hours) 3 - Symptomatic, >50% in bed, but not bedbound (Capable of only limited self-care, confined to bed or chair 50% or more of waking hours) 4 - Bedbound (Completely disabled. Cannot carry on any self-care. Totally confined to bed or chair) 5 - Death   Aurea Blossom MM, Creech RH, Tormey DC, et al. [phone]). "Toxicity and response criteria of the [facility] [employer]". Am. Hillard Lowes. Oncol. 5 (6): 649-55      IMPRESSION/PLAN: 1.         Putative Stage IA2, cT1bN0M0 NSCLC of the RLL. The patient is doing better than he had been a few months ago in his physical activities. We reviewed his reassuring results from his  recent CT scan of the chest. While the RML nodule we recently noted has been stable, I think it is reasonable to consider going back to 6  month interval. He is in agreement with this plan.  2. RML nodule. This area remains indeterminate but stable. We will plan to follow this in 6 month's time.  3. Elevated BP at home and pleural effusions. The patient's cardiology team is aware of his pressures and per family do not seem concerned. Fortunately his effusions have improved since his last visit. We will follow these issues expectantly.  This encounter was conducted via telephone.  The patient has provided two factor identification and has given verbal consent for this type of encounter and has been advised to only accept a meeting of this type in a secure network environment. The time spent during this encounter was 35 minutes including preparation, discussion, and coordination of the patient's care. The attendants for this meeting include   Bettejane Brownie  and his daughter Marna Singleton and son in law Jed Smirnow. During the encounter,  Bettejane Brownie was located at [facility] in the radiation oncology department. [name]. was located at home as was his daughter Marna Singleton and son in law Jed Smirnow.     Shelvia Dick, PAC

## 2023-07-06 ENCOUNTER — Telehealth (HOSPITAL_BASED_OUTPATIENT_CLINIC_OR_DEPARTMENT_OTHER): Payer: Self-pay | Admitting: *Deleted

## 2023-07-06 ENCOUNTER — Encounter (HOSPITAL_BASED_OUTPATIENT_CLINIC_OR_DEPARTMENT_OTHER): Payer: Self-pay | Admitting: Cardiovascular Disease

## 2023-07-06 ENCOUNTER — Ambulatory Visit (INDEPENDENT_AMBULATORY_CARE_PROVIDER_SITE_OTHER): Payer: Medicare Other | Admitting: Cardiovascular Disease

## 2023-07-06 VITALS — BP 130/58 | HR 47 | Ht 67.0 in | Wt 137.0 lb

## 2023-07-06 DIAGNOSIS — I4891 Unspecified atrial fibrillation: Secondary | ICD-10-CM

## 2023-07-06 DIAGNOSIS — E785 Hyperlipidemia, unspecified: Secondary | ICD-10-CM | POA: Diagnosis not present

## 2023-07-06 DIAGNOSIS — I10 Essential (primary) hypertension: Secondary | ICD-10-CM

## 2023-07-06 DIAGNOSIS — I484 Atypical atrial flutter: Secondary | ICD-10-CM

## 2023-07-06 MED ORDER — FUROSEMIDE 20 MG PO TABS: ORAL_TABLET | ORAL | 3 refills | Status: AC

## 2023-07-06 MED ORDER — METOPROLOL SUCCINATE ER 25 MG PO TB24: 25.0000 mg | ORAL_TABLET | Freq: Every day | ORAL | 3 refills | Status: AC

## 2023-07-06 NOTE — Telephone Encounter (Signed)
 S/w pharmacy to confirm if pt is taking lasix.  Lasix ( 20 mg) has not been filled since Feb and Kdur had not been filled since Jan.

## 2023-07-06 NOTE — Patient Instructions (Signed)
 Medication Instructions:  Your physician has recommended you make the following change in your medication:   Stop Metoprolol  Tartrate   Start: Metoprolol  Succinate 25mg  daily   Start: Lasix 20mg  on Monday's & Friday's   Lab Work: Your physician recommends that you return for lab work in one week for Fasting Lipid Panel & CMP   Follow-Up:  Please follow up in 6 months with Dr. Theodis Fiscal, Slater Duncan, NP or Neomi Banks, NP   Other Instructions

## 2023-07-06 NOTE — Progress Notes (Signed)
 Cardiology Office Note:  .   Date:  07/06/2023  ID:  Donald Ask., DOB 01/17/1925, MRN [id number] PCP: Aldo Hun, MD  [facility] Providers Cardiologist:  Maudine Sos, MD    History of Present Illness: .    Donald Hamade. is a 89 y.o. male with coronary calcification, hypertension, hyperlipidemia, atrial flutter, PVCs, SVT, asthma, GERD, gout, and lung cancer status post radiation here for follow-up.  He presented to [facility] 12/2022 with weakness and tachycardia.  He was noted to be in atrial flutter in the 140s.  Echo revealed LVEF 60-65% with mild LVH.  He was started on Eliquis  and diltiazem .  He followed up 01/2023 and was in sinus rhythm with PVCs.  He wore a 14-day ZIO monitor with an average heart rate of 73 bpm, minimum heart rate 55, maximum in sinus rhythm was 112 bpm, he had frequent PACs at 13.3%, 5.4% atrial couplets, 2.2% atrial triplets he had 2098 episodes of SVT, longest episode lasting 17.2 seconds fastest at 207 bpm. He was referred to EP. He was seen by Dr. Arlester Ladd on 03/17/2023, he noted numerous atrial runs however patient was asymptomatic. Dr. Arlester Ladd felt that aggressive intervention such as antiarrhythmic drug, ablation or aggressive rate control is warranted given the brevity of episodes. His diltiazem  was discontinued and he was continued on metoprolol  25 mg twice daily.  He last saw Katlyn West, NP 03/2023.  At that time he was doing well.  Blood pressure was uncontrolled.  He was asked to monitor at home with plans to start losartan or amlodipine , which he was previously on in the past.  Discussed the use of AI scribe software for clinical note transcription with the patient, who gave verbal consent to proceed.  History of Present Illness [name] has been monitoring his blood pressure and heart rate daily, with systolic blood pressure ranging from 127 to 130 mmHg and diastolic pressure around 56 mmHg. His heart rate is consistently low, around 40-60  beats per minute. No dizziness, lightheadedness, or heart palpitations. He is currently taking metoprolol  25 mg twice daily, Eliquis , and Crestor , along with potassium supplements.  He experiences swelling in his legs, which he attributes to fluid retention. He has previously taken furosemide (Lasix) for this issue but is not currently on it. He does not use compression socks currently but has used them in the past.  He has no orthopnea, PND or shortness of breath.   He maintains an active lifestyle, including mowing the lawn in the evenings to avoid the heat. He ensures he stays hydrated, although he mentions he has never been one to drink much water , even during physical activities like tennis.  ROS:  As per HPI  Studies Reviewed: .       Echo 01/21/23:  1. Left ventricular ejection fraction, by estimation, is 60 to 65%. The  left ventricle has normal function. The left ventricle has no regional  wall motion abnormalities. There is mild left ventricular hypertrophy.  Left ventricular diastolic function  could not be evaluated.   2. Right ventricular systolic function is normal. The right ventricular  size is normal. There is mildly elevated pulmonary artery systolic  pressure.   3. Left atrial size was mild to moderately dilated.   4. The mitral valve is grossly normal. Mild mitral valve regurgitation.  No evidence of mitral stenosis.   5. Tricuspid valve regurgitation is moderate.   6. The aortic valve is tricuspid. There is mild  calcification of the  aortic valve. There is mild thickening of the aortic valve. Aortic valve  regurgitation is not visualized. Mild aortic valve stenosis.   7. Aortic dilatation noted. There is borderline dilatation of the  ascending aorta, measuring 38 mm.   8. The inferior vena cava is dilated in size with >50% respiratory  variability, suggesting right atrial pressure of 8 mmHg.   14 Day Zio Monitor   Quality: Fair.  Baseline artifact. Predominant  rhythm:  Average heart rate: 73 bpm Max heart rate: 112 bpm in sinus rhythm Min heart rate: 55 bpm Pauses >2.5 seconds: none   Frequent PACs (13.3%).  5.4% atrial couplets.  2.2% atrial triplets.  Rare (<1%) PVCs.    Many (2098) episodes of SVT.  Fasting 207 bpm.  Longest episode 17.2 seconds. Runs of SVT lasting up to 33 beats.    Risk Assessment/Calculations:    CHA2DS2-VASc Score = 4   This indicates a 4.8% annual risk of stroke. The patient's score is based upon: CHF History: 0 HTN History: 1 Diabetes History: 0 Stroke History: 0 Vascular Disease History: 1 Age Score: 2 Gender Score: 0            Physical Exam:   VS:  BP (!) 130/58 (BP Location: Left Arm, Patient Position: Sitting, Cuff Size: Normal)   Pulse (!) 47   Ht 5\' 7"  (1.702 m)   Wt 137 lb (62.1 kg)   SpO2 97%   BMI 21.46 kg/m  , BMI Body mass index is 21.46 kg/m. GENERAL:  Well appearing HEENT: Pupils equal round and reactive, fundi not visualized, oral mucosa unremarkable NECK:  No jugular venous distention, waveform within normal limits, carotid upstroke brisk and symmetric, no bruits, no thyromegaly LUNGS:  Clear to auscultation bilaterally HEART:  RRR.  PMI not displaced or sustained,S1 and S2 within normal limits, no S3, no S4, no clicks, no rubs, no murmurs ABD:  Flat, positive bowel sounds normal in frequency in pitch, no bruits, no rebound, no guarding, no midline pulsatile mass, no hepatomegaly, no splenomegaly EXT:  2 plus pulses throughout, 2+ LE edema to the mid tibia bilaterally. No cyanosis no clubbing SKIN:  No rashes no nodules NEURO:  Cranial nerves II through XII grossly intact, motor grossly intact throughout PSYCH:  Cognitively intact, oriented to person place and time   ASSESSMENT AND PLAN: .    Assessment & Plan # Paroxysmal Atrial Fibrillation Heart rate low at 47 bpm, likely due to metoprolol . Adjusting to long-acting formulation to prevent bradycardia. - Switch metoprolol  to  a long-acting formulation, 25 mg once daily. - Continue Eliquis  for stroke prevention.  Will need to closely monitor weight and eGFR for need for dose reduction.   # Hypertension Well-controlled with current regimen. Home readings 127-140 systolic. - Continue home blood pressure monitoring. - Switch metoprolol  to a long-acting formulation, 25 mg once daily.  # LE Edema # HFpEF:  Leg edema indicates fluid retention. Previously managed with furosemide. - Prescribe furosemide 20 mg twice a week (Monday and Friday). - Recommend wearing compression socks.  # Hyperlipidemia Managed with rosuvastatin . Re-evaluation of cholesterol levels needed. - Order lipid panel and comprehensive metabolic panel.   Dispo: f/u in 6 months  Signed, Maudine Sos, MD

## 2023-08-15 DIAGNOSIS — I4891 Unspecified atrial fibrillation: Secondary | ICD-10-CM | POA: Diagnosis not present

## 2023-08-15 DIAGNOSIS — I10 Essential (primary) hypertension: Secondary | ICD-10-CM | POA: Diagnosis not present

## 2023-08-15 DIAGNOSIS — E785 Hyperlipidemia, unspecified: Secondary | ICD-10-CM | POA: Diagnosis not present

## 2023-08-15 DIAGNOSIS — I484 Atypical atrial flutter: Secondary | ICD-10-CM | POA: Diagnosis not present

## 2023-08-15 LAB — COMPREHENSIVE METABOLIC PANEL WITH GFR
ALT: 14 IU/L (ref 0–44)
AST: 17 IU/L (ref 0–40)
Albumin: 3.7 g/dL (ref 3.6–4.6)
Alkaline Phosphatase: 76 IU/L (ref 44–121)
BUN/Creatinine Ratio: 15 (ref 10–24)
BUN: 21 mg/dL (ref 10–36)
Bilirubin Total: 0.5 mg/dL (ref 0.0–1.2)
CO2: 25 mmol/L (ref 20–29)
Calcium: 9.2 mg/dL (ref 8.6–10.2)
Chloride: 106 mmol/L (ref 96–106)
Creatinine, Ser: 1.36 mg/dL — ABNORMAL HIGH (ref 0.76–1.27)
Globulin, Total: 2.2 g/dL (ref 1.5–4.5)
Glucose: 97 mg/dL (ref 70–99)
Potassium: 3.7 mmol/L (ref 3.5–5.2)
Sodium: 143 mmol/L (ref 134–144)
Total Protein: 5.9 g/dL — ABNORMAL LOW (ref 6.0–8.5)
eGFR: 47 mL/min/{1.73_m2} — ABNORMAL LOW (ref >59)

## 2023-08-15 LAB — LIPID PANEL
Chol/HDL Ratio: 2.4 ratio (ref 0.0–5.0)
Cholesterol, Total: 113 mg/dL (ref 100–199)
HDL: 47 mg/dL (ref >39)
LDL Chol Calc (NIH): 54 mg/dL (ref 0–99)
Triglycerides: 48 mg/dL (ref 0–149)
VLDL Cholesterol Cal: 12 mg/dL (ref 5–40)

## 2023-08-19 ENCOUNTER — Ambulatory Visit: Payer: Self-pay | Admitting: Cardiovascular Disease

## 2023-08-25 DIAGNOSIS — H40013 Open angle with borderline findings, low risk, bilateral: Secondary | ICD-10-CM | POA: Diagnosis not present

## 2023-12-09 DIAGNOSIS — Z23 Encounter for immunization: Secondary | ICD-10-CM | POA: Diagnosis not present

## 2024-01-10 DIAGNOSIS — L821 Other seborrheic keratosis: Secondary | ICD-10-CM | POA: Diagnosis not present

## 2024-01-10 DIAGNOSIS — L570 Actinic keratosis: Secondary | ICD-10-CM | POA: Diagnosis not present

## 2024-01-10 DIAGNOSIS — L720 Epidermal cyst: Secondary | ICD-10-CM | POA: Diagnosis not present

## 2024-01-10 DIAGNOSIS — L814 Other melanin hyperpigmentation: Secondary | ICD-10-CM | POA: Diagnosis not present

## 2024-01-10 DIAGNOSIS — Z85828 Personal history of other malignant neoplasm of skin: Secondary | ICD-10-CM | POA: Diagnosis not present

## 2024-01-10 DIAGNOSIS — C44619 Basal cell carcinoma of skin of left upper limb, including shoulder: Secondary | ICD-10-CM | POA: Diagnosis not present

## 2024-01-11 ENCOUNTER — Ambulatory Visit
Admission: RE | Admit: 2024-01-11 | Discharge: 2024-01-11 | Disposition: A | Discharge status: Home / Self Care | Source: Ambulatory Visit | Attending: Radiation Oncology | Admitting: Radiation Oncology

## 2024-01-11 DIAGNOSIS — C3431 Malignant neoplasm of lower lobe, right bronchus or lung: Secondary | ICD-10-CM

## 2024-01-11 DIAGNOSIS — C3490 Malignant neoplasm of unspecified part of unspecified bronchus or lung: Secondary | ICD-10-CM | POA: Diagnosis not present

## 2024-01-11 DIAGNOSIS — J90 Pleural effusion, not elsewhere classified: Secondary | ICD-10-CM | POA: Diagnosis not present

## 2024-01-11 MED ORDER — IOPAMIDOL (ISOVUE-300) INJECTION 61%
75.0000 mL | Freq: Once | INTRAVENOUS | Status: AC | PRN
  Administered 2024-01-11: 75 mL via INTRAVENOUS

## 2024-01-16 NOTE — Progress Notes (Signed)
 Follow up call to discuss chest CT scan results from 01/11/24.  Patient is not having any pain but does complain of some coughing/shortness of breath and a lot of phlegm- states he is having an asthma flare up.   IMPRESSION: 1. New 11 x 8 mm nodular opacity in the right middle lobe. New clustered irregular nodular focus in the perifissural right lung measures 18 x 7 mm. Imaging features are indeterminate and may be infectious/inflammatory but close follow-up is warranted as neoplasm not excluded. 2. Slight increase in the small right pleural effusion. Trace left pleural effusion evident. 3. Stable appearance of the scattered bilateral pulmonary nodules. 4. Aortic Atherosclerosis (ICD10-I70.0) and Emphysema (ICD10-J43.9).

## 2024-01-23 ENCOUNTER — Ambulatory Visit
Admission: RE | Admit: 2024-01-23 | Discharge: 2024-01-23 | Disposition: A | Discharge status: Home / Self Care | Source: Ambulatory Visit | Attending: Radiation Oncology | Admitting: Radiation Oncology

## 2024-01-23 DIAGNOSIS — C3431 Malignant neoplasm of lower lobe, right bronchus or lung: Secondary | ICD-10-CM | POA: Diagnosis not present

## 2024-01-23 NOTE — Progress Notes (Addendum)
 Radiation Oncology         [phone] ________________________________  Outpatient Follow Up - Conducted via telephone at patient request.  I spoke with the patient to conduct this visit via telephone. The patient was notified in advance and was offered an in person or telemedicine meeting to allow for face to face communication but instead preferred to proceed with a telephone visit. ________________________________    Name: [name].        MRN: [id number]   Date of Service:01/23/24  DOB: 1924-05-12  RR:Ezmpwp, Oneil, MD  Kerrin Elspeth BROCKS, *     REFERRING PHYSICIAN: Kerrin Elspeth BROCKS, *  DIAGNOSIS: The primary encounter diagnosis was Nodule of lower lobe of right lung. A diagnosis of Malignant neoplasm of bronchus of right lower lobe [facility]) was also pertinent to this visit.  HISTORY OF PRESENT ILLNESS: [name]. is a 89 y.o.  male with a history of putative stage I lung cancer of the right lower lobe. The patient has a longstanding history of smoking,and is a lifelong smoker.  A chest x-ray at [facility] after a flare of asthma  revealed a nodule in the lung.  He proceeded with CT imaging on 10/23/2019 which revealed a 1.1 x 1.4 x 2.8 cm nodule that was spiculated in the right lower lobe, a PET scan on 11/23/2019 revealed an SUV of 3.2 and the nodule in the right lower lobe measured 1.6 x 1.4 cm, blood pool activity was 2.5.  There was also focal hypermetabolic activity in the right infrahilar region with an SUV max of 4.2 however there was no CT correlate for this.  He was counseled on options of proceeding with biopsy but given his age and comorbidities declined, he did have a repeat CT scan with super D component on 02/19/2020 redemonstrated the lobulated pulmonary nodule in the right lower lobe which abuts the major fissure measuring 1.4 x 1.3 cm. While it was not significantly changed but there was more hypodensity of the internal appearance of the  nodule. He was not interested in surgical resection and rather proceeded with Stereotactic Body Radiotherapy (SBRT). He completed this in February 2022.   Since his treatment, he has been NED with stable emphysematous and atherosclerotic changes. We have followed a cluster of RML nodularity but this was slightly more prominent in January of 2025, and he had more notable effusions in the pleural space. We planned to repeat at a shorter interval and his scan on 06/27/23 showed post radiation changes consistent with prior treatment in the RLL region, and stable to improved nodular change in the RML, stable LUL nodule and decrease in the Right pleural effusion and resolution of the left pleural effusion. He continues to have stigmata of emphysema, and atherosclerotic disease of the aorta, and probable sludge and tiny stones in the gallbladder. He was counseled on a repeat scan and his most recent on 01/11/24 showed stable post treatment changes in the RLL, there was new clustered nodular focus in the perifissural right lung measuring 18 mm and 11 mm nodular opacity along the RML that was new as well. No adenopathy was appreciated. He had small pleural effusion on the right slightly increased and trace left pleural effusion. No adenopathy was appreciated and he is contacted today to review these results.  PREVIOUS RADIATION THERAPY:   03/25/2020 through 04/01/2020  SBRT Treatment Site Technique Total Dose (Gy) Dose per Fx (Gy) Completed Fx Beam Energies  Lung, Right:  Lung_RLL IMRT 54/54 18 3/3 6XFFF      PAST MEDICAL HISTORY:  Past Medical History:  Diagnosis Date   Anemia    Arthritis    Cataract    Complication following bilateral mastoidectomy    Dysphagia    with large pills   Gout    HLD (hyperlipidemia)    HTN (hypertension)    Lung nodule 02/19/2020   Melanoma (HCC) 2009   Left forehead   Right posterior capsular opacification 06/21/2019   Wrist fracture    left    PAST SURGICAL  HISTORY: Past Surgical History:  Procedure Laterality Date   cataracts     MASTOIDECTOMY REVISION Bilateral years ago   ORIF WRIST FRACTURE Left 1975   QUADRICEPS TENDON REPAIR Right 08/23/2018   Procedure: REPAIR QUADRICEP TENDON;  Surgeon: Melodi Lerner, MD;  Location: [facility];  Service: Orthopedics;  Laterality: Right;    TOTAL HIP ARTHROPLASTY Left 05/05/2016   Procedure: LEFT TOTAL HIP ARTHROPLASTY ANTERIOR APPROACH;  Surgeon: Lerner Melodi, MD;  Location: [facility];  Service: Orthopedics;  Laterality: Left;    PAST SOCIAL HISTORY:  Social History   Socioeconomic History   Marital status: Widowed    Spouse name: Not on file   Number of children: 2   Years of education: Not on file   Highest education level: Not on file  Occupational History   Occupation: retired  Tobacco Use   Smoking status: Never   Smokeless tobacco: Never  Vaping Use   Vaping status: Never Used  Substance and Sexual Activity   Alcohol  use: No   Drug use: No   Sexual activity: Not on file  Other Topics Concern   Not on file  Social History Narrative   Not on file   Social Drivers of Health   Financial Resource Strain: Not on file  Food Insecurity: No Food Insecurity (01/23/2024)   Hunger Vital Sign    Worried About Running Out of Food in the Last Year: Never true    Ran Out of Food in the Last Year: Never true  Transportation Needs: No Transportation Needs (01/23/2024)   PRAPARE - Administrator, Civil Service (Medical): No    Lack of Transportation (Non-Medical): No  Physical Activity: Not on file  Stress: Not on file  Social Connections: Not on file  Intimate Partner Violence: Not At Risk (01/23/2024)   Humiliation, Afraid, Rape, and Kick questionnaire    Fear of Current or Ex-Partner: No    Emotionally Abused: No    Physically Abused: No    Sexually Abused: No    PAST FAMILY HISTORY: Family History  Problem Relation Age of Onset   Pneumonia Mother    Heart attack  Father    Breast cancer Sister    Heart attack Nephew 66   Heart failure Sister    Kidney Stones Daughter    Esophageal cancer Neg Hx    Colon cancer Neg Hx    Rectal cancer Neg Hx    Stomach cancer Neg Hx     MEDICATIONS  Current Outpatient Medications  Medication Sig Dispense Refill   acetaminophen  (TYLENOL ) 325 MG tablet Take 2 tablets (650 mg total) by mouth every 6 (six) hours as needed for mild pain (pain score 1-3) (or Fever >/= 101).     apixaban  (ELIQUIS ) 5 MG TABS tablet Take 1 tablet (5 mg total) by mouth 2 (two) times daily. 180 tablet 3   calcium  carbonate (TUMS - DOSED IN  MG ELEMENTAL CALCIUM ) 500 MG chewable tablet Chew 1-2 tablets by mouth as needed for indigestion or heartburn.     Calcium -Vitamin D -Vitamin K  (VIACTIV CALCIUM  PLUS D) 650-12.5-40 MG-MCG-MCG CHEW Chew 1 each by mouth daily.     Camphor-Eucalyptus-Menthol  (CHEST RUB) 4.8-1.2-2.6 % OINT Apply 1 application  topically See admin instructions. Vick's vaporub- Apply to the chest nightly, then cover     Cholecalciferol  (VITAMIN D3) 50 MCG (2000 UT) TABS Take 2,000 Units by mouth daily.     Cyanocobalamin  (B-12) 2500 MCG TABS Take 2,500 mcg by mouth daily.     fexofenadine  (ALLEGRA  ALLERGY) 180 MG tablet Take 1 tablet (180 mg total) by mouth daily. 30 tablet 6   finasteride  (PROSCAR ) 5 MG tablet Take 5 mg by mouth daily.     fluticasone  (FLONASE ) 50 MCG/ACT nasal spray Place 1 spray into both nostrils daily. INSTILL 1 SPRAY INTO EACH NOSTRIL ONCE DAILY AS DIRECTED 16 mL 2   fluticasone -salmeterol (ADVAIR) 100-50 MCG/ACT AEPB Inhale 1 puff into the lungs 2 (two) times daily. 60 each 11   furosemide  (LASIX ) 20 MG tablet Take one tablet on Monday and Wednesday 24 tablet 3   ipratropium (ATROVENT ) 0.03 % nasal spray Place 2 sprays into both nostrils 2 (two) times daily. INSTILL 2 SPRAYS INTO EACH NOSTRIL EVERY 12 HOURS AS DIRECTED 30 mL 12   metoprolol  succinate (TOPROL -XL) 25 MG 24 hr tablet Take 1 tablet (25 mg total)  by mouth daily. Take with or immediately following a meal. 90 tablet 3   NON FORMULARY Take 1 tablet by mouth See admin instructions. Vision Vite - Take 1 tablet by mouth with breakfast     polyethylene glycol (MIRALAX  / GLYCOLAX ) packet Take 17 g by mouth daily as needed for mild constipation (mix as directed).     rosuvastatin  (CRESTOR ) 20 MG tablet Take 20 mg by mouth daily.     Wheat Dextrin (BENEFIBER) POWD Take 4 g by mouth See admin instructions. Mix 4 grams (1 teaspoonful) of powder into 4-8 ounces of water  and drink once a day     No current facility-administered medications for this encounter.    ALLERGIES:  Allergies  Allergen Reactions   Other Other (See Comments)    Pt cannot take gel caps - med gets stuck in throat        REVIEW OF SYSTEMS:  On review of systems, the patient is doing well overall. He physically has been doing what he wants and though he's hard of hearing is happy with his functional abilities. He has been having progressive mucous production and shortness of breath over the past few months and in the last few weeks has noticed more of this without a change in the color of phlegm or fevers. He has not had recent exposure to any sick contacts. No other complaints are verbalized.        PHYSICAL EXAM:  Unable to assess due to encounter type.    ECOG = 1 0 - Asymptomatic (Fully active, able to carry on all predisease activities without restriction) 1 - Symptomatic but completely ambulatory (Restricted in physically strenuous activity but ambulatory and able to carry out work of a light or sedentary nature. For example, light housework, office work) 2 - Symptomatic, <50% in bed during the day (Ambulatory and capable of all self care but unable to carry out any work activities. Up and about more than 50% of waking hours) 3 - Symptomatic, >50% in bed, but not bedbound (Capable  of only limited self-care, confined to bed or chair 50% or more of waking hours) 4  - Bedbound (Completely disabled. Cannot carry on any self-care. Totally confined to bed or chair) 5 - Death   Raylene MM, Creech RH, Tormey DC, et al. [phone]). Toxicity and response criteria of the [facility]. Am. DOROTHA Bridges. Oncol. 5 (6): 649-55      IMPRESSION/PLAN: 1.         Putative Stage IA2, cT1bN0M0 NSCLC of the RLL. The patient is doing better than he had been a few months ago in his physical activities. This site appears to be radiographically without disease and we will follow this closely.  2. RML nodule. There had been a stable change in the RML previously and now changes in the RML that are new, so we will plan a repeat CT in 2-3 months. The patient and family are in agreement with this.  3. Elevated BP at home and pleural effusions. The patient's cardiology team is aware of his pressures and since his effusions wax and wane we will follow expectantly but copy our notes to cardiology as he is also due for routine follow up. 4. Asthma. I encouraged him to follow up with pulmonary as he's had more mucous production and more recent shortness of breath though he's using his Advair inhaler as prescribed.   This encounter was conducted via telephone.  The patient has provided two factor identification and has given verbal consent for this type of encounter and has been advised to only accept a meeting of this type in a secure network environment. The time spent during this encounter was 35 minutes including preparation, discussion, and coordination of the patient's care. The attendants for this meeting include  [name]  and his daughter [name] and son in law [name]. During the encounter,  [name] was located remotely at home. [name]. was located at home as was his daughter [name] and son in law [name].     Donald KYM Ramsey, PAC

## 2024-01-23 NOTE — Addendum Note (Signed)
 Encounter addended by: Lanell Donald Stagger, PA-C on: 01/23/2024 2:50 PM  Actions taken: Clinical Note Signed

## 2024-01-24 ENCOUNTER — Telehealth: Payer: Self-pay | Admitting: Adult Health

## 2024-01-24 NOTE — Telephone Encounter (Signed)
-----   Message from Donald Estefana Husband sent at 01/23/2024  2:13 PM EST ----- Hey everyone! Happy early Thanksgiving, can we get him in to see one of you? He's having the same complaints as far as his asthma, but more mucous in the last few months and more shortness of breath too?! Thanks, Donald

## 2024-01-24 NOTE — Telephone Encounter (Signed)
 Pt has an appt with Dr. Kara on 01/25/24. NFN

## 2024-01-24 NOTE — Telephone Encounter (Signed)
 He's having the same complaints as far as his asthma, but more mucous in the last few months and more shortness of breath too?!   Can we call patient and get worked into schedule I have openings next week Monday or Tuesday can use

## 2024-01-25 ENCOUNTER — Ambulatory Visit: Admitting: Pulmonary Disease

## 2024-01-25 ENCOUNTER — Encounter: Payer: Self-pay | Admitting: Pulmonary Disease

## 2024-01-25 VITALS — BP 161/77 | HR 68 | Ht 67.0 in | Wt 136.0 lb

## 2024-01-25 DIAGNOSIS — J4521 Mild intermittent asthma with (acute) exacerbation: Secondary | ICD-10-CM | POA: Diagnosis not present

## 2024-01-25 MED ORDER — PREDNISONE 10 MG PO TABS: 40.0000 mg | ORAL_TABLET | Freq: Every day | ORAL | 0 refills | Status: AC

## 2024-01-25 MED ORDER — AZITHROMYCIN 250 MG PO TABS
ORAL_TABLET | ORAL | 0 refills | Status: AC
Start: 2024-01-25 — End: ?

## 2024-01-25 NOTE — Patient Instructions (Addendum)
 Continue advair 1 puff twice daily - rinse mouth out after each use  Continue flonase  nasal spray and ipratropium nasal spray  Continue fexofenadine  (Allegra ) 180mg  daily  Start prednisone  40mg  daily for 5 days  Start Zpak antibiotic for 5 days  Follow up in 3 months, call sooner if needed

## 2024-01-25 NOTE — Progress Notes (Signed)
 Established Patient Pulmonology Office Visit   Subjective:  Patient ID: Donald Cork., male    DOB: Jul 14, 1924  MRN: [id number]  CC:  Chief Complaint  Patient presents with   Medical Management of Chronic Issues    Pt states SOB, cough , hoarseness, can't brig anything up x 2 weeks or so      Discussed the use of AI scribe software for clinical note transcription with the patient, who gave verbal consent to proceed.  History of Present Illness [name] is a 89 year old male who presents with persistent phlegm and respiratory issues. He is accompanied by his family members.  For the past 2 to 2.5 weeks he has had persistent phlegm and respiratory issues that began around the time of a chest x-ray. The phlegm has not cleared and is worsening, with increased difficulty clearing his throat. This year symptoms persist throughout the day, whereas last year he improved by afternoon. He has no fevers or chills and his appetite is stable.  He has chronic sinus congestion and drainage that improve with long-term use of nasal sprays, especially at night. He denies heartburn or reflux.  He uses Advair one puff in the morning and only occasionally at night, not twice daily as prescribed, because he feels well after the morning dose. He notes nasal drainage that he associates with cold weather and has been avoiding exposure to sick contacts.  He remains physically active with daily walking and bed exercises.        ROS    Current Outpatient Medications:    acetaminophen  (TYLENOL ) 325 MG tablet, Take 2 tablets (650 mg total) by mouth every 6 (six) hours as needed for mild pain (pain score 1-3) (or Fever >/= 101)., Disp: , Rfl:    apixaban  (ELIQUIS ) 5 MG TABS tablet, Take 1 tablet (5 mg total) by mouth 2 (two) times daily., Disp: 180 tablet, Rfl: 3   azithromycin  (ZITHROMAX ) 250 MG tablet, Take as directed, Disp: 6 tablet, Rfl: 0   calcium  carbonate (TUMS - DOSED IN MG  ELEMENTAL CALCIUM ) 500 MG chewable tablet, Chew 1-2 tablets by mouth as needed for indigestion or heartburn., Disp: , Rfl:    Calcium -Vitamin D -Vitamin K  (VIACTIV CALCIUM  PLUS D) 650-12.5-40 MG-MCG-MCG CHEW, Chew 1 each by mouth daily., Disp: , Rfl:    Camphor-Eucalyptus-Menthol  (CHEST RUB) 4.8-1.2-2.6 % OINT, Apply 1 application  topically See admin instructions. Vick's vaporub- Apply to the chest nightly, then cover, Disp: , Rfl:    Cholecalciferol  (VITAMIN D3) 50 MCG (2000 UT) TABS, Take 2,000 Units by mouth daily., Disp: , Rfl:    Cyanocobalamin  (B-12) 2500 MCG TABS, Take 2,500 mcg by mouth daily., Disp: , Rfl:    fexofenadine  (ALLEGRA  ALLERGY) 180 MG tablet, Take 1 tablet (180 mg total) by mouth daily., Disp: 30 tablet, Rfl: 6   finasteride  (PROSCAR ) 5 MG tablet, Take 5 mg by mouth daily., Disp: , Rfl:    fluticasone  (FLONASE ) 50 MCG/ACT nasal spray, Place 1 spray into both nostrils daily. INSTILL 1 SPRAY INTO EACH NOSTRIL ONCE DAILY AS DIRECTED, Disp: 16 mL, Rfl: 2   fluticasone -salmeterol (ADVAIR) 100-50 MCG/ACT AEPB, Inhale 1 puff into the lungs 2 (two) times daily., Disp: 60 each, Rfl: 11   furosemide  (LASIX ) 20 MG tablet, Take one tablet on Monday and Wednesday, Disp: 24 tablet, Rfl: 3   ipratropium (ATROVENT ) 0.03 % nasal spray, Place 2 sprays into both nostrils 2 (two) times daily. INSTILL 2 SPRAYS INTO  EACH NOSTRIL EVERY 12 HOURS AS DIRECTED, Disp: 30 mL, Rfl: 12   metoprolol  succinate (TOPROL -XL) 25 MG 24 hr tablet, Take 1 tablet (25 mg total) by mouth daily. Take with or immediately following a meal., Disp: 90 tablet, Rfl: 3   polyethylene glycol (MIRALAX  / GLYCOLAX ) packet, Take 17 g by mouth daily as needed for mild constipation (mix as directed)., Disp: , Rfl:    predniSONE  (DELTASONE ) 10 MG tablet, Take 4 tablets (40 mg total) by mouth daily with breakfast., Disp: 20 tablet, Rfl: 0   rosuvastatin  (CRESTOR ) 20 MG tablet, Take 20 mg by mouth daily., Disp: , Rfl:    Wheat Dextrin  (BENEFIBER) POWD, Take 4 g by mouth See admin instructions. Mix 4 grams (1 teaspoonful) of powder into 4-8 ounces of water  and drink once a day, Disp: , Rfl:    NON FORMULARY, Take 1 tablet by mouth See admin instructions. Vision Vite - Take 1 tablet by mouth with breakfast, Disp: , Rfl:       Objective:  BP (!) 161/77   Pulse 68   Ht 5' 7 (1.702 m) Comment: per pt  Wt 136 lb (61.7 kg) Comment: per pt  SpO2 96%   BMI 21.30 kg/m     Physical Exam Constitutional:      General: He is not in acute distress.    Appearance: Normal appearance.  Eyes:     General: No scleral icterus.    Conjunctiva/sclera: Conjunctivae normal.  Cardiovascular:     Rate and Rhythm: Normal rate and regular rhythm.  Pulmonary:     Breath sounds: No wheezing, rhonchi or rales.  Musculoskeletal:     Right lower leg: No edema.     Left lower leg: No edema.  Skin:    General: Skin is warm and dry.  Neurological:     General: No focal deficit present.      Diagnostic Review:  Last CBC Lab Results  Component Value Date   WBC 7.0 02/01/2023   HGB 10.1 (L) 02/01/2023   HCT 30.8 (L) 02/01/2023   MCV 98 (H) 02/01/2023   MCH 32.3 02/01/2023   RDW 13.2 02/01/2023   PLT 276 02/01/2023   Last metabolic panel Lab Results  Component Value Date   GLUCOSE 97 08/15/2023   NA 143 08/15/2023   K 3.7 08/15/2023   CL 106 08/15/2023   CO2 25 08/15/2023   BUN 21 08/15/2023   CREATININE 1.36 (H) 08/15/2023   EGFR 47 (L) 08/15/2023   CALCIUM  9.2 08/15/2023   PROT 5.9 (L) 08/15/2023   ALBUMIN  3.7 08/15/2023   LABGLOB 2.2 08/15/2023   BILITOT 0.5 08/15/2023   ALKPHOS 76 08/15/2023   AST 17 08/15/2023   ALT 14 08/15/2023   ANIONGAP 7 01/22/2023   CT Chest 01/11/24 1. New 11 x 8 mm nodular opacity in the right middle lobe. New clustered irregular nodular focus in the perifissural right lung measures 18 x 7 mm. Imaging features are indeterminate and may be infectious/inflammatory but close follow-up  is warranted as neoplasm not excluded. 2. Slight increase in the small right pleural effusion. Trace left pleural effusion evident. 3. Stable appearance of the scattered bilateral pulmonary nodules. 4. Aortic Atherosclerosis (ICD10-I70.0) and Emphysema (ICD10-J43.9).     Assessment & Plan:   Assessment & Plan Mild intermittent asthma with acute exacerbation  Orders:   predniSONE  (DELTASONE ) 10 MG tablet; Take 4 tablets (40 mg total) by mouth daily with breakfast.   azithromycin  (ZITHROMAX ) 250 MG  tablet; Take as directed   Assessment and Plan Assessment & Plan Asthma with acute exacerbation Acute exacerbation with increased phlegm and difficulty clearing mucus. Initiated prednisone  and azithromycin  for exacerbation and potential infection. Discussed prednisone  side effects and advised dose reduction if anxiety or excessive energy occurs. - Prescribed prednisone  40 mg, 4 tablets daily for 5 days. - Prescribed azithromycin  (Z-Pak), 2 tablets on the first day, then 1 tablet daily for the next 4 days. - Recommended using Advair inhaler twice daily, morning and evening, until symptoms improve. - Discussed potential future increase in Advair dose if symptoms persist.  Chronic rhinitis with nasal congestion and drainage Chronic rhinitis with exacerbated symptoms, possibly due to seasonal changes and allergens. Nasal sprays effective, especially at night. - Continue using nasal sprays as needed, particularly at night. - Encouraged deep belly breathing exercises and regular walking to improve respiratory function.      Return in about 3 months (around 04/26/2024) for f/u visit Dr. Kara.   Dorn KATHEE Kara, MD

## 2024-01-30 ENCOUNTER — Encounter: Payer: Self-pay | Admitting: Pulmonary Disease

## 2024-02-13 DIAGNOSIS — M8589 Other specified disorders of bone density and structure, multiple sites: Secondary | ICD-10-CM | POA: Diagnosis not present

## 2024-02-27 ENCOUNTER — Telehealth: Payer: Self-pay | Admitting: *Deleted

## 2024-02-27 NOTE — Telephone Encounter (Signed)
 CALLED PATIENT'S DAUGHTER- NANCY TO INFORM OF CT FOR 03-23-24- ARRIVAL TIME- 1:45 PM @ [facility], NO RESTRICTIONS TO SCAN, PATIENT TO RECEIVE RESULTS FROM ALISON PERKINS ON 04-02-24 @ 2:30 PM VIA TELEPHONE, SPOKE WITH PATIENT'S DAUGHTER - NANCY AND SHE IS AWARE OF THESE APPTS. AND THE INSTRUCTIONS

## 2024-03-23 ENCOUNTER — Ambulatory Visit (HOSPITAL_COMMUNITY)
Admission: RE | Admit: 2024-03-23 | Discharge: 2024-03-23 | Disposition: A | Source: Ambulatory Visit | Attending: Radiation Oncology | Admitting: Radiation Oncology

## 2024-03-23 DIAGNOSIS — C3431 Malignant neoplasm of lower lobe, right bronchus or lung: Secondary | ICD-10-CM | POA: Diagnosis present

## 2024-03-23 LAB — POCT I-STAT CREATININE: Creatinine, Ser: 1.3 mg/dL — ABNORMAL HIGH (ref 0.61–1.24)

## 2024-03-23 MED ORDER — IOHEXOL 300 MG/ML  SOLN
75.0000 mL | Freq: Once | INTRAMUSCULAR | Status: AC | PRN
  Administered 2024-03-23: 75 mL via INTRAVENOUS

## 2024-03-28 NOTE — Progress Notes (Incomplete)
 Follow up call to discuss chest CT scan results from 03/23/24.   IMPRESSION: 1. Essentially stable irregular opacity in the right lung lower lobe, compatible with post treatment changes. 2. Interval decrease in the size of middle lobe opacity, compatible with post treatment changes. 3. Complete resolution of previously noted new 11 x 8 mm nodular opacity in the middle lobe. 4. No new or suspicious lung nodule. Complete resolution of previously seen small right pleural effusion. 5. Multiple other nonacute observations (such as emphysema, mild bilateral renal atrophy, stable peripherally rim calcified lesion arising from the left kidney lower pole, etc.), As described above.

## 2024-04-02 ENCOUNTER — Ambulatory Visit
Admission: RE | Admit: 2024-04-02 | Discharge: 2024-04-02 | Disposition: A | Source: Ambulatory Visit | Attending: Radiation Oncology | Admitting: Radiation Oncology

## 2024-04-02 DIAGNOSIS — C3431 Malignant neoplasm of lower lobe, right bronchus or lung: Secondary | ICD-10-CM

## 2024-04-03 ENCOUNTER — Other Ambulatory Visit: Payer: Self-pay | Admitting: Radiation Oncology

## 2024-04-03 DIAGNOSIS — C3431 Malignant neoplasm of lower lobe, right bronchus or lung: Secondary | ICD-10-CM

## 2024-04-20 ENCOUNTER — Ambulatory Visit (HOSPITAL_BASED_OUTPATIENT_CLINIC_OR_DEPARTMENT_OTHER): Admitting: Family

## 2024-10-15 ENCOUNTER — Ambulatory Visit: Admitting: Radiation Oncology
